# Patient Record
Sex: Female | Born: 1990 | Race: Black or African American | Hispanic: No | Marital: Single | Smoking: Never smoker
Health system: Southern US, Community
[De-identification: ages and names within clinical notes are randomized; demographics above are authoritative.]

## PROBLEM LIST (undated history)

## (undated) ENCOUNTER — Inpatient Hospital Stay (HOSPITAL_COMMUNITY): Payer: Self-pay

## (undated) DIAGNOSIS — A6000 Herpesviral infection of urogenital system, unspecified: Secondary | ICD-10-CM

## (undated) DIAGNOSIS — M329 Systemic lupus erythematosus, unspecified: Secondary | ICD-10-CM

## (undated) DIAGNOSIS — A549 Gonococcal infection, unspecified: Secondary | ICD-10-CM

## (undated) HISTORY — DX: Herpesviral infection of urogenital system, unspecified: A60.00

---
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
## 2011-01-08 LAB — CBC
HCT: 40.5 % (ref 36.0–49.0)
Hemoglobin: 11.5 g/dL — ABNORMAL LOW (ref 12.0–16.0)
Hemoglobin: 13.4 g/dL (ref 12.0–16.0)
MCHC: 33.2 g/dL (ref 31.0–37.0)
MCV: 89.1 fL (ref 78.0–98.0)
Platelets: 132 10*3/uL — ABNORMAL LOW (ref 150–400)
Platelets: 141 10*3/uL — ABNORMAL LOW (ref 150–400)
RDW: 14.3 % (ref 11.4–15.5)
WBC: 5.7 10*3/uL (ref 4.5–13.5)

## 2011-02-11 NOTE — H&P (Signed)
NAMEDYAN, Stacy Kelley                ACCOUNT NO.:  [id number]   MEDICAL RECORD NO.:  [id number]          PATIENT TYPE:  INP   LOCATION:  9109                          FACILITY:  [facility]   PHYSICIAN:  Roseanna Rainbow, M.D.DATE OF BIRTH:  25-Dec-1990   DATE OF ADMISSION:  01/01/2009  DATE OF DISCHARGE:                              HISTORY & PHYSICAL   CHIEF COMPLAINT:  The patient is a 20 year old, para 0 with an estimated  date of confinement of January 08, 2009 with an intrauterine pregnancy at  39 weeks, complicated by intrauterine growth restriction for induction  of labor.   HISTORY OF PRESENT ILLNESS:  The patient has had serial ultrasounds for  growth and the height and antenatal testing.  A most recent scan on  December 25, 2008 showed a normal amniotic fluid index, a BPP of 8/8,  estimated fetal weight percentile at the less than 10th percentile with  biometry suspect for asymmetric IUGR.  The S/D ratio for the Doppler  study was elevated.  This persisted with subsequent testing; however,  there was no evidence of any absence or reversal of flow.   ALLERGIES:  No known drug allergies.   MEDICATIONS:  Prenatal vitamins.   PRENATAL LABORATORY DATA:  Blood type is O positive.  Antibody screen  negative.  Chlamydia probe negative.  GC probe negative.  One-hour GTT  77.  GBS positive on December 04, 2008.  Hepatitis B surface antigen  negative.  Hematocrit 35.4 and hemoglobin 11.3.  HIV nonreactive.  Platelets 162,000.  RPR nonreactive.  Sickle cell negative.  Varicella  immune.   PAST GYN HISTORY:  Noncontributory.   PAST MEDICAL HISTORY:  Migraine headaches.   PAST SURGICAL HISTORY:  No previous surgery.   SOCIAL HISTORY:  She is a Consulting civil engineer.  She is single, does not give any  significant history of alcohol usage, has no significant smoking  history.  Denies illicit drug use.   FAMILY HISTORY:  Remarkable for diabetes and hypertension.   REVIEW OF SYSTEMS:  Noncontributory.   PHYSICAL EXAMINATION:  VITAL SIGNS:  Stable, Afebrile.  FHT: Reassuring  General: NAD  Abdomen: Gravid, NT  Pelvic: Per RN   ASSESSMENT & PLAN:  IUP at 39 weeks, IUGR elevated doppler S/D ratio.  FHT consistent with fetal well-being.   Admit.  Induction of labor    DICTATION ENDED AT THIS POINT.      Roseanna Rainbow, M.D.  Electronically Signed     LAJ/MEDQ  D:  01/01/2009  T:  01/01/2009  Job:  [id number]

## 2011-02-11 NOTE — Op Note (Signed)
[name], Stacy Kelley                ACCOUNT NO.:  [id number]   MEDICAL RECORD NO.:  [id number]          PATIENT TYPE:  INP   LOCATION:  9109                          FACILITY:  [facility]   PHYSICIAN:  Roseanna Rainbow, M.D.DATE OF BIRTH:  [birth year]   DATE OF PROCEDURE:  01/01/2009  DATE OF DISCHARGE:                               OPERATIVE REPORT   PREOPERATIVE DIAGNOSIS:  Intrauterine pregnancy at 39 weeks,  intrauterine growth restriction, breech presentation.   POSTOPERATIVE DIAGNOSIS:  Intrauterine pregnancy at 39 weeks,  intrauterine growth restriction, breech presentation.   PROCEDURE:  Primary low uterine flap, elliptical cesarean delivery via a  Pfannenstiel skin incision.   SURGEON:  Roseanna Rainbow, MD   ANESTHESIA:  Epidural.   FINDINGS:  The infant was in frank breech presentation.  The sacrum was  anterior.   PATHOLOGY:  Placenta.   ESTIMATED BLOOD LOSS:  600 mL.   COMPLICATIONS:  None.   PROCEDURE:  The patient was taken to the operating room with an IV  running and an epidural catheter in situ.  She was placed in the dorsal  supine position with a leftward tilt and prepped and draped in the usual  sterile fashion.  After time-out had been completed, a transverse skin  incision was then made with a scalpel.  This was carried down to the  underlying fascia with the Bovie.  The fascia was then nicked in the  midline.  The fascial incision was then extended along the length of the  incision.  The rectus muscles were separated in the midline bluntly.  The parietal peritoneum was entered bluntly and this incision was then  extended bluntly.  The bladder blade was then placed.  The vesicouterine  peritoneum was tented up and entered sharply.  This incision was then  extended bilaterally and the bladder flap created bluntly.  The bladder  blade was then placed.  The lower uterine segment was then incised in a  transverse fashion with the scalpel.  This  incision was then extended  bluntly.  A complete breech extraction was then performed.  The head was  then delivered atraumatically.  The oropharynx was suctioned with bulb  suction.  The cord was clamped and cut.  The infant was handed off to  the awaiting neonatologist.  The placenta was then removed.  The  intrauterine cavity was evacuated of any remaining amniotic fluid,  clots, and debris with a moistened laparotomy sponge.  The uterine  incision was then reapproximated in a running interlocking fashion using  a suture of 0 Monocryl.  A second imbricating layer of the same suture  was then placed.  Figure-of-eight sutures of 2-0 Vicryl were placed to  control individual bleeding points on the uterine serosa.  The paracolic  gutters were then irrigated.  The parietal peritoneum was then  reapproximated in a running fashion using a suture of 2-0 Vicryl.  The  fascia was reapproximated in a running fashion using 2 sutures of 0  Vicryl tied in the midline.  The skin was closed with staples.  At the  close of  the procedure, the instrument and pack counts were said to be  correct x2.  The patient was taken to the PACU awake and stable  condition.      Roseanna Rainbow, M.D.  Electronically Signed     LAJ/MEDQ  D:  01/01/2009  T:  01/02/2009  Job:  [id number]

## 2011-02-14 NOTE — Discharge Summary (Signed)
Stacy Kelley, MIS                ACCOUNT NO.:  [id number]   MEDICAL RECORD NO.:  [id number]          PATIENT TYPE:  INP   LOCATION:  9109                          [facility]ACILITY:  [facility]   PHYSICIAN:  Roseanna Rainbow, M.D.DATE OF BIRTH:  12/05/1990   DATE OF ADMISSION:  01/01/2009  DATE OF DISCHARGE:  01/04/2009                               DISCHARGE SUMMARY   CHIEF COMPLAINT:  The patient is a 20 year old para 0 with an estimated  date of confinement of January 08, 2009 with an intrauterine pregnancy at  39 weeks complicated by intrauterine growth restriction for induction of  labor.  Please see the dictated history and physical.   HOSPITAL COURSE:  The patient was admitted.  Cervix was ripened.  Low-  dose Pitocin was then started.  She progressed in labor.  However, was  found to have a breech presentation.  At that point, she was brought for  cesarean delivery.  Please see the dictated operative summary.  On  postoperative day #1 the hemoglobin was 11.  The remainder of the  postoperative course was uneventful.  She was discharged to home on  postoperative day 3 tolerating a regular diet.  She received Depo-  Provera for contraception prior to discharge.   DISCHARGE DIAGNOSIS:  1. Intrauterine pregnancy at 39 weeks.  2. Intrauterine growth restriction.  3. Breech presentation.   PROCEDURES:  Cesarean delivery.   CONDITION:  Stable.   DIET:  Regular.   ACTIVITY:  Pelvic rest progressive activity.   MEDICATIONS:  Included Percocet.   DISPOSITION:  The patient was to follow up in the office in 2 weeks.      Roseanna Rainbow, M.D.  Electronically Signed     LAJ/MEDQ  D:  01/18/2009  T:  01/18/2009  Job:  [id number]

## 2011-04-10 ENCOUNTER — Emergency Department (HOSPITAL_COMMUNITY)
Admission: EM | Admit: 2011-04-10 | Discharge: 2011-04-10 | Disposition: A | Discharge status: Home / Self Care | Payer: Medicaid Other | Attending: Emergency Medicine | Admitting: Emergency Medicine

## 2011-04-10 ENCOUNTER — Emergency Department (HOSPITAL_COMMUNITY): Payer: Medicaid Other

## 2011-04-10 DIAGNOSIS — R109 Unspecified abdominal pain: Secondary | ICD-10-CM | POA: Insufficient documentation

## 2011-04-10 DIAGNOSIS — Z3201 Encounter for pregnancy test, result positive: Secondary | ICD-10-CM | POA: Insufficient documentation

## 2011-04-10 LAB — POCT PREGNANCY, URINE: Preg Test, Ur: POSITIVE

## 2011-04-10 LAB — POCT I-STAT, CHEM 8
BUN: 5 mg/dL — ABNORMAL LOW (ref 6–23)
Calcium, Ion: 1.12 mmol/L (ref 1.12–1.32)
HCT: 39 % (ref 36.0–46.0)
Sodium: 137 mEq/L (ref 135–145)

## 2011-04-10 LAB — HCG, QUANTITATIVE, PREGNANCY: hCG, Beta Chain, Quant, S: 62691 m[IU]/mL — ABNORMAL HIGH (ref <5)

## 2011-04-10 LAB — URINALYSIS, ROUTINE W REFLEX MICROSCOPIC
Glucose, UA: NEGATIVE mg/dL
Leukocytes, UA: NEGATIVE
Specific Gravity, Urine: 1.027 (ref 1.005–1.030)
Urobilinogen, UA: 1 mg/dL (ref 0.0–1.0)

## 2011-04-10 LAB — CBC
MCHC: 34.2 g/dL (ref 30.0–36.0)
Platelets: 168 10*3/uL (ref 150–400)
RDW: 14.1 % (ref 11.5–15.5)
WBC: 2.8 10*3/uL — ABNORMAL LOW (ref 4.0–10.5)

## 2011-04-10 LAB — WET PREP, GENITAL
Trich, Wet Prep: NONE SEEN
Yeast Wet Prep HPF POC: NONE SEEN

## 2011-04-13 LAB — GC/CHLAMYDIA PROBE AMP, GENITAL: GC Probe Amp, Genital: NEGATIVE

## 2011-10-25 ENCOUNTER — Other Ambulatory Visit: Payer: Self-pay

## 2011-10-25 ENCOUNTER — Encounter (HOSPITAL_COMMUNITY): Payer: Self-pay | Admitting: Emergency Medicine

## 2011-10-25 ENCOUNTER — Emergency Department (HOSPITAL_COMMUNITY): Payer: Self-pay

## 2011-10-25 ENCOUNTER — Emergency Department (HOSPITAL_COMMUNITY)
Admission: EM | Admit: 2011-10-25 | Discharge: 2011-10-26 | Disposition: A | Discharge status: Home / Self Care | Payer: Self-pay | Attending: Emergency Medicine | Admitting: Emergency Medicine

## 2011-10-25 DIAGNOSIS — R079 Chest pain, unspecified: Secondary | ICD-10-CM | POA: Insufficient documentation

## 2011-10-25 DIAGNOSIS — R000 Tachycardia, unspecified: Secondary | ICD-10-CM | POA: Insufficient documentation

## 2011-10-25 DIAGNOSIS — R509 Fever, unspecified: Secondary | ICD-10-CM | POA: Insufficient documentation

## 2011-10-25 DIAGNOSIS — R51 Headache: Secondary | ICD-10-CM | POA: Insufficient documentation

## 2011-10-25 DIAGNOSIS — R0602 Shortness of breath: Secondary | ICD-10-CM | POA: Insufficient documentation

## 2011-10-25 DIAGNOSIS — B9789 Other viral agents as the cause of diseases classified elsewhere: Secondary | ICD-10-CM | POA: Insufficient documentation

## 2011-10-25 DIAGNOSIS — B349 Viral infection, unspecified: Secondary | ICD-10-CM

## 2011-10-25 DIAGNOSIS — R5381 Other malaise: Secondary | ICD-10-CM | POA: Insufficient documentation

## 2011-10-25 DIAGNOSIS — R5383 Other fatigue: Secondary | ICD-10-CM | POA: Insufficient documentation

## 2011-10-25 LAB — URINALYSIS, ROUTINE W REFLEX MICROSCOPIC
Nitrite: NEGATIVE
Specific Gravity, Urine: 1.02 (ref 1.005–1.030)
Urobilinogen, UA: 1 mg/dL (ref 0.0–1.0)
pH: 6 (ref 5.0–8.0)

## 2011-10-25 LAB — BLOOD GAS, VENOUS
Bicarbonate: 24.3 mEq/L — ABNORMAL HIGH (ref 20.0–24.0)
TCO2: 21.5 mmol/L (ref 0–100)
pCO2, Ven: 38.8 mmHg — ABNORMAL LOW (ref 45.0–50.0)
pH, Ven: 7.413 — ABNORMAL HIGH (ref 7.250–7.300)
pO2, Ven: 59.5 mmHg — ABNORMAL HIGH (ref 30.0–45.0)

## 2011-10-25 LAB — DIFFERENTIAL
Eosinophils Absolute: 0 10*3/uL (ref 0.0–0.7)
Lymphocytes Relative: 17 % (ref 12–46)
Lymphs Abs: 0.7 10*3/uL (ref 0.7–4.0)
Monocytes Relative: 10 % (ref 3–12)
Neutro Abs: 3.1 10*3/uL (ref 1.7–7.7)
Neutrophils Relative %: 72 % (ref 43–77)

## 2011-10-25 LAB — LACTIC ACID, PLASMA: Lactic Acid, Venous: 1.2 mmol/L (ref 0.5–2.2)

## 2011-10-25 LAB — POCT I-STAT, CHEM 8
Calcium, Ion: 1.16 mmol/L (ref 1.12–1.32)
Creatinine, Ser: 0.7 mg/dL (ref 0.50–1.10)
Glucose, Bld: 116 mg/dL — ABNORMAL HIGH (ref 70–99)
Potassium: 3 mEq/L — ABNORMAL LOW (ref 3.5–5.1)
Sodium: 140 mEq/L (ref 135–145)

## 2011-10-25 LAB — URINE MICROSCOPIC-ADD ON

## 2011-10-25 LAB — CBC
Hemoglobin: 13.1 g/dL (ref 12.0–15.0)
MCH: 27.6 pg (ref 26.0–34.0)
RBC: 4.74 MIL/uL (ref 3.87–5.11)

## 2011-10-25 LAB — POCT PREGNANCY, URINE: Preg Test, Ur: NEGATIVE

## 2011-10-25 MED ORDER — POTASSIUM CHLORIDE CRYS ER 20 MEQ PO TBCR
40.0000 meq | EXTENDED_RELEASE_TABLET | Freq: Once | ORAL | Status: AC
  Administered 2011-10-25: 40 meq via ORAL
  Filled 2011-10-25: qty 2

## 2011-10-25 MED ORDER — SODIUM CHLORIDE 0.9 % IV BOLUS (SEPSIS)
1000.0000 mL | Freq: Once | INTRAVENOUS | Status: AC
  Administered 2011-10-25: 1000 mL via INTRAVENOUS

## 2011-10-25 MED ORDER — ASPIRIN 81 MG PO CHEW
324.0000 mg | CHEWABLE_TABLET | Freq: Once | ORAL | Status: AC
  Administered 2011-10-25: 324 mg via ORAL
  Filled 2011-10-25: qty 4

## 2011-10-25 MED ORDER — ACETAMINOPHEN 325 MG PO TABS
650.0000 mg | ORAL_TABLET | Freq: Four times a day (QID) | ORAL | Status: DC | PRN
  Administered 2011-10-25: 650 mg via ORAL
  Filled 2011-10-25: qty 2

## 2011-10-25 MED ORDER — IOHEXOL 300 MG/ML  SOLN
100.0000 mL | Freq: Once | INTRAMUSCULAR | Status: AC | PRN
  Administered 2011-10-25: 100 mL via INTRAVENOUS

## 2011-10-25 NOTE — ED Notes (Signed)
ZOX:[id number]<AV> Expected date:10/25/11<BR> Expected time: 8:51 PM<BR> Means of arrival:Ambulance<BR> Comments:<BR> EMS 241 GC  - sob - s/s began at 4pm/pt ambulatory on the scene

## 2011-10-25 NOTE — ED Notes (Signed)
Patient transported to CT 

## 2011-10-25 NOTE — ED Notes (Signed)
AS per EMS pt c/o sob starting at 4pm and no feeling well.

## 2011-10-26 ENCOUNTER — Other Ambulatory Visit: Payer: Self-pay

## 2011-10-26 LAB — POCT I-STAT TROPONIN I: Troponin i, poc: 0 ng/mL (ref 0.00–0.08)

## 2011-10-26 MED ORDER — OSELTAMIVIR PHOSPHATE 75 MG PO CAPS: 75.0000 mg | ORAL_CAPSULE | Freq: Two times a day (BID) | ORAL | Status: AC

## 2011-10-26 MED ORDER — SODIUM CHLORIDE 0.9 % IV BOLUS (SEPSIS)
1000.0000 mL | Freq: Once | INTRAVENOUS | Status: AC
  Administered 2011-10-26: 1000 mL via INTRAVENOUS

## 2011-10-26 MED ORDER — OSELTAMIVIR PHOSPHATE 75 MG PO CAPS
75.0000 mg | ORAL_CAPSULE | Freq: Once | ORAL | Status: AC
  Administered 2011-10-26: 75 mg via ORAL
  Filled 2011-10-26: qty 1

## 2011-10-26 MED ORDER — MORPHINE SULFATE 4 MG/ML IJ SOLN
4.0000 mg | Freq: Once | INTRAMUSCULAR | Status: AC
  Administered 2011-10-26: 4 mg via INTRAVENOUS
  Filled 2011-10-26: qty 1

## 2011-10-26 NOTE — ED Provider Notes (Addendum)
History     CSN: [id number]  Arrival date & time 10/25/11  2053   First MD Initiated Contact with Patient 10/25/11 2058      Chief Complaint  Patient presents with  . Breathing Problem  . Fever    (Consider location/radiation/quality/duration/timing/severity/associated sxs/prior treatment) HPI Patient is a 21 year old female in no history of medical problems who presents today complaining of 3 days of upper respiratory symptoms. She also complains of slight chest pain. This is worse with cough and deep breath. She has no history of DVT or PE. Patient does not smoke and is not taking any hormone therapies. She's not been on any recent long trips. She has no history of heart disease. Patient has been around her son who is had URI symptoms. She endorses having some mild congestion and fever. Patient is tachycardic to the 130s upon arrival. She's also febrile. She last took Tylenol at 4 PM. She denies any urinary symptoms or abdominal symptoms. There are no other associated or modifying factors No past medical history on file.  No past surgical history on file.  No family history on file.  History  Substance Use Topics  . Smoking status: Never Smoker   . Smokeless tobacco: Not on file  . Alcohol Use: No    OB History    Grav Para Term Preterm Abortions TAB SAB Ect Mult Living                  Review of Systems  Constitutional: Positive for fever, appetite change and fatigue.  HENT: Positive for congestion and sore throat.   Eyes: Negative.   Respiratory: Positive for cough.   Cardiovascular: Positive for chest pain.  Gastrointestinal: Negative.   Genitourinary: Negative.   Musculoskeletal: Negative.   Skin: Negative.   Neurological: Positive for headaches.  Hematological: Negative.   Psychiatric/Behavioral: Negative.   All other systems reviewed and are negative.    Allergies  Review of patient's allergies indicates no known allergies.  Home Medications    Current Outpatient Rx  Name Route Sig Dispense Refill  . ACETAMINOPHEN 325 MG PO TABS Oral Take 650 mg by mouth every 6 (six) hours as needed. For pain.      BP 110/60  Pulse 107  Temp(Src) 98.5 F (36.9 C) (Oral)  Resp 20  SpO2 100%  LMP 10/16/2011  Physical Exam  Nursing note and vitals reviewed. Constitutional: She is oriented to person, place, and time. She appears well-developed and well-nourished. No distress.  HENT:  Head: Normocephalic and atraumatic.  Eyes: Conjunctivae and EOM are normal. Pupils are equal, round, and reactive to light.  Neck: Normal range of motion.  Cardiovascular: Regular rhythm, normal heart sounds and intact distal pulses.  Tachycardia present.  Exam reveals no gallop and no friction rub.   No murmur heard. Pulmonary/Chest: Effort normal and breath sounds normal. No respiratory distress. She has no wheezes. She has no rales.  Abdominal: Soft. Bowel sounds are normal. She exhibits no distension. There is no tenderness. There is no rebound and no guarding.  Musculoskeletal: Normal range of motion. She exhibits no edema and no tenderness.  Neurological: She is alert and oriented to person, place, and time. No cranial nerve deficit. She exhibits normal muscle tone. Coordination normal.  Skin: Skin is warm and dry. No rash noted.  Psychiatric:       Anxious    ED Course  Procedures (including critical care time)   Date: 10/25/2011  Rate: 134  Rhythm: sinus  tachycardia  QRS Axis: normal  Intervals: normal  ST/T Wave abnormalities: nonspecific ST/T changes  Conduction Disutrbances:none  Narrative Interpretation:   Old EKG Reviewed: none available   Date: 10/26/2011  Rate: 102  Rhythm: sinus tachycardia  QRS Axis: right  Intervals: normal  ST/T Wave abnormalities: nonspecific T wave changes  Conduction Disutrbances:none  Narrative Interpretation: Improved rate. T-wave inversions all that are seen now  Old EKG Reviewed: changes  noted  Labs Reviewed  BLOOD GAS, VENOUS - Abnormal; Notable for the following:    pH, Ven 7.413 (*)    pCO2, Ven 38.8 (*)    pO2, Ven 59.5 (*)    Bicarbonate 24.3 (*)    All other components within normal limits  URINALYSIS, ROUTINE W REFLEX MICROSCOPIC - Abnormal; Notable for the following:    Hgb urine dipstick SMALL (*)    Ketones, ur TRACE (*)    Protein, ur 30 (*)    Leukocytes, UA SMALL (*)    All other components within normal limits  D-DIMER, QUANTITATIVE - Abnormal; Notable for the following:    D-Dimer, Quant 1.19 (*)    All other components within normal limits  POCT I-STAT, CHEM 8 - Abnormal; Notable for the following:    Potassium 3.0 (*)    BUN 5 (*)    Glucose, Bld 116 (*)    All other components within normal limits  URINE MICROSCOPIC-ADD ON - Abnormal; Notable for the following:    Bacteria, UA FEW (*)    All other components within normal limits  CBC  DIFFERENTIAL  LACTIC ACID, PLASMA  POCT PREGNANCY, URINE  POCT I-STAT TROPONIN I  POCT PREGNANCY, URINE  I-STAT TROPONIN I  POCT URINE PREGNANCY  I-STAT, CHEM 8  I-STAT TROPONIN I   Dg Chest 2 View  10/25/2011  *RADIOLOGY REPORT*  Clinical Data: Shortness of breath.  Chest pain.  CHEST - 2 VIEW  Comparison:  None.  Findings:  The heart size and mediastinal contours are within normal limits.  Both lungs are clear.  The visualized skeletal structures are unremarkable.  IMPRESSION: No active cardiopulmonary disease.  Original Report Authenticated By: Danae Orleans, M.D.   Ct Angio Chest W/cm &/or Wo Cm  10/25/2011  *RADIOLOGY REPORT*  Clinical Data: Chest pain.  Shortness of breath.  Elevated D-dimer.  CT ANGIOGRAPHY CHEST  Technique:  Multidetector CT imaging of the chest using the standard protocol during bolus administration of intravenous contrast. Multiplanar reconstructed images including MIPs were obtained and reviewed to evaluate the vascular anatomy.  Contrast: OMNIPAQUE IOHEXOL 300 MG/ML IV SOLN   Comparison: None.  Findings: Satisfactory opacification of the pulmonary arteries noted, and there is no evidence of pulmonary emboli.  No evidence of thoracic aortic aneurysm or dissection.  No evidence of mediastinal hematoma or mass. No adenopathy seen elsewhere within the thorax.  Residual thymic tissue noted in the anterior mediastinum which is normal for age.  There is no evidence of pleural or pericardial effusion.  Both lungs are clear.  No evidence of pulmonary infiltrate or mass.  No evidence of central endobronchial lesion.  IMPRESSION: Negative.  No evidence of pulmonary embolism or other active disease.  Original Report Authenticated By: Danae Orleans, M.D.     1. Chest pain   2. Viral syndrome       MDM  Patient was evaluated by myself. Symptoms were thought to be mostly secondary to viral syndrome. Patient was febrile and was given Tylenol. IV fluids were also  ordered. CBC showed no leukocytosis. Renal panel was unremarkable. Patient's vital signs did improve. Given her variation in symptoms with inspiration d-dimer was ordered. Additionally patient did have nonspecific ST segment T wave changes on her EKG. There is no EKG available for comparison. A troponin was ordered and this was negative. Once patient's symptoms improved repeat EKG was performed. T-wave inversions that are still noted. Three-hour troponin was ordered. Patient was given aspirin. Her chest pain resolved. Given elevated d-dimer patient had CT. There is no acute pulmonary process noted. Patient was given Tamiflu given the severity of her symptoms. At this time she is pending her three-hour troponin. If this returns within normal limits patient will be discharged home with prescription for Tamiflu. She is receiving a second liter of IV fluid at this time.        Cyndra Numbers, MD 10/26/11 0054  2:07 AM Second troponin returned normal. Heart rate is down to the 80s. Patient will be discharged in good condition with  prescription for Tamiflu. Family and patient comfortable with plan. Work note was provided.  Cyndra Numbers, MD 10/26/11 [phone]

## 2011-12-11 ENCOUNTER — Encounter (HOSPITAL_COMMUNITY): Payer: Self-pay | Admitting: Emergency Medicine

## 2011-12-11 ENCOUNTER — Emergency Department (HOSPITAL_COMMUNITY)
Admission: EM | Admit: 2011-12-11 | Discharge: 2011-12-11 | Disposition: A | Discharge status: Home / Self Care | Payer: Self-pay | Attending: Emergency Medicine | Admitting: Emergency Medicine

## 2011-12-11 DIAGNOSIS — T22019A Burn of unspecified degree of unspecified forearm, initial encounter: Secondary | ICD-10-CM | POA: Insufficient documentation

## 2011-12-11 DIAGNOSIS — X19XXXA Contact with other heat and hot substances, initial encounter: Secondary | ICD-10-CM | POA: Insufficient documentation

## 2011-12-11 DIAGNOSIS — Y9269 Other specified industrial and construction area as the place of occurrence of the external cause: Secondary | ICD-10-CM | POA: Insufficient documentation

## 2011-12-11 DIAGNOSIS — T300 Burn of unspecified body region, unspecified degree: Secondary | ICD-10-CM

## 2011-12-11 MED ORDER — BACITRACIN ZINC 500 UNIT/GM EX OINT: TOPICAL_OINTMENT | Freq: Two times a day (BID) | CUTANEOUS | Status: AC

## 2011-12-11 NOTE — Discharge Instructions (Signed)
Please keep the area clean and dry per the instructions. Return to your primary care doctor's office for worsening condition.
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
## 2011-12-13 NOTE — ED Provider Notes (Signed)
Medical screening examination/treatment/procedure(s) were performed by non-physician practitioner and as supervising physician I was immediately available for consultation/collaboration.  Lauretta Sallas R Zafar Debrosse, MD 12/13/11 0704 

## 2012-11-05 ENCOUNTER — Emergency Department (HOSPITAL_COMMUNITY)
Admission: EM | Admit: 2012-11-05 | Discharge: 2012-11-05 | Disposition: A | Discharge status: Home / Self Care | Payer: Self-pay | Attending: Emergency Medicine | Admitting: Emergency Medicine

## 2012-11-05 ENCOUNTER — Encounter (HOSPITAL_COMMUNITY): Payer: Self-pay | Admitting: Emergency Medicine

## 2012-11-05 DIAGNOSIS — H6690 Otitis media, unspecified, unspecified ear: Secondary | ICD-10-CM | POA: Insufficient documentation

## 2012-11-05 MED ORDER — AMOXICILLIN 500 MG PO CAPS: 1000.0000 mg | ORAL_CAPSULE | Freq: Three times a day (TID) | ORAL | Status: DC

## 2012-11-05 NOTE — ED Provider Notes (Signed)
History     CSN: [id number]  Arrival date & time 11/05/12  1153   First MD Initiated Contact with Patient 11/05/12 1215      Chief Complaint  Patient presents with  . Otalgia    (Consider location/radiation/quality/duration/timing/severity/associated sxs/prior treatment) HPI   Stacy Kelley is a 22 y.o. female in no acute distress complaining of right ear pain worsening over the last 24 hours. Patient denies fever, cough, pharyngitis, distant, shortness of breath abdominal pain, nausea vomiting diarrhea. She rates her pain as severe 7/10. No exacerbating or alleviating factors identified.  History reviewed. No pertinent past medical history.  History reviewed. No pertinent past surgical history.  No family history on file.  History  Substance Use Topics  . Smoking status: Never Smoker   . Smokeless tobacco: Not on file  . Alcohol Use: No    OB History    Grav Para Term Preterm Abortions TAB SAB Ect Mult Living                  Review of Systems  Constitutional: Negative for fever.  Respiratory: Negative for shortness of breath.   Cardiovascular: Negative for chest pain.  Gastrointestinal: Negative for nausea, vomiting, abdominal pain and diarrhea.  All other systems reviewed and are negative.    Allergies  Review of patient's allergies indicates no known allergies.  Home Medications  No current outpatient prescriptions on file.  BP 116/69  Pulse 83  Temp 98.7 F (37.1 C) (Oral)  Resp 18  SpO2 100%  Physical Exam  Nursing note and vitals reviewed. Constitutional: She is oriented to person, place, and time. She appears well-developed and well-nourished. No distress.  HENT:  Head: Normocephalic.  Right Ear: External ear normal.  Left Ear: External ear normal.  Mouth/Throat: Oropharynx is clear and moist.       Right TM significantly injected and mildly bulging with reduced light reflex  Eyes: Conjunctivae normal and EOM are normal. Pupils are equal,  round, and reactive to light.  Neck: Neck supple.       Shotty nontender anterior cervical lymphadenopathy  Cardiovascular: Normal rate.   Pulmonary/Chest: Effort normal and breath sounds normal. No stridor. No respiratory distress. She has no wheezes. She has no rales. She exhibits no tenderness.  Abdominal: Soft. Bowel sounds are normal. She exhibits no distension and no mass. There is no tenderness. There is no rebound and no guarding.  Musculoskeletal: Normal range of motion.  Lymphadenopathy:    She has cervical adenopathy.  Neurological: She is alert and oriented to person, place, and time.  Psychiatric: She has a normal mood and affect.    ED Course  Procedures (including critical care time)  Labs Reviewed - No data to display No results found.   1. Otitis media       MDM     Filed Vitals:   11/05/12 1202  BP: 116/69  Pulse: 83  Temp: 98.7 F (37.1 C)  TempSrc: Oral  Resp: 18  SpO2: 100%     Pt verbalized understanding and agrees with care plan. Outpatient follow-up and return precautions given.    New Prescriptions   AMOXICILLIN (AMOXIL) 500 MG CAPSULE    Take 2 capsules (1,000 mg total) by mouth 3 (three) times daily.    I personally performed the services described in this documentation, which was scribed in my presence. The recorded information has been reviewed and is accurate.        Wynetta Emery, PA-C 11/05/12  1314 

## 2012-11-05 NOTE — ED Notes (Signed)
Pt complains of right ear pain x 1 day

## 2012-11-06 NOTE — ED Provider Notes (Signed)
Medical screening examination/treatment/procedure(s) were performed by non-physician practitioner and as supervising physician I was immediately available for consultation/collaboration.  Tyeler Goedken, MD 11/06/12 1825 

## 2013-01-25 ENCOUNTER — Emergency Department (HOSPITAL_COMMUNITY)
Admission: EM | Admit: 2013-01-25 | Discharge: 2013-01-25 | Disposition: A | Discharge status: Home / Self Care | Payer: Self-pay | Attending: Emergency Medicine | Admitting: Emergency Medicine

## 2013-01-25 ENCOUNTER — Emergency Department (HOSPITAL_COMMUNITY): Payer: Self-pay

## 2013-01-25 DIAGNOSIS — R1310 Dysphagia, unspecified: Secondary | ICD-10-CM | POA: Insufficient documentation

## 2013-01-25 DIAGNOSIS — R630 Anorexia: Secondary | ICD-10-CM | POA: Insufficient documentation

## 2013-01-25 DIAGNOSIS — R599 Enlarged lymph nodes, unspecified: Secondary | ICD-10-CM | POA: Insufficient documentation

## 2013-01-25 DIAGNOSIS — R609 Edema, unspecified: Secondary | ICD-10-CM | POA: Insufficient documentation

## 2013-01-25 DIAGNOSIS — R259 Unspecified abnormal involuntary movements: Secondary | ICD-10-CM | POA: Insufficient documentation

## 2013-01-25 DIAGNOSIS — K1370 Unspecified lesions of oral mucosa: Secondary | ICD-10-CM | POA: Insufficient documentation

## 2013-01-25 DIAGNOSIS — E042 Nontoxic multinodular goiter: Secondary | ICD-10-CM | POA: Insufficient documentation

## 2013-01-25 DIAGNOSIS — K0889 Other specified disorders of teeth and supporting structures: Secondary | ICD-10-CM

## 2013-01-25 DIAGNOSIS — J029 Acute pharyngitis, unspecified: Secondary | ICD-10-CM | POA: Insufficient documentation

## 2013-01-25 LAB — POCT I-STAT, CHEM 8
Chloride: 105 mEq/L (ref 96–112)
HCT: 39 % (ref 36.0–46.0)
Hemoglobin: 13.3 g/dL (ref 12.0–15.0)
Potassium: 3.4 mEq/L — ABNORMAL LOW (ref 3.5–5.1)
Sodium: 142 mEq/L (ref 135–145)

## 2013-01-25 MED ORDER — HYDROCODONE-ACETAMINOPHEN 5-325 MG PO TABS: 1.0000 | ORAL_TABLET | ORAL | Status: DC | PRN

## 2013-01-25 MED ORDER — OXYCODONE-ACETAMINOPHEN 5-325 MG PO TABS
2.0000 | ORAL_TABLET | Freq: Once | ORAL | Status: AC
  Administered 2013-01-25: 1 via ORAL
  Filled 2013-01-25: qty 1

## 2013-01-25 MED ORDER — IOHEXOL 300 MG/ML  SOLN
80.0000 mL | Freq: Once | INTRAMUSCULAR | Status: AC | PRN
  Administered 2013-01-25: 80 mL via INTRAVENOUS

## 2013-01-25 MED ORDER — PENICILLIN V POTASSIUM 500 MG PO TABS: 500.0000 mg | ORAL_TABLET | Freq: Three times a day (TID) | ORAL | Status: AC

## 2013-01-25 MED ORDER — ONDANSETRON 4 MG PO TBDP
4.0000 mg | ORAL_TABLET | Freq: Once | ORAL | Status: AC
  Administered 2013-01-25: 4 mg via ORAL
  Filled 2013-01-25: qty 1

## 2013-01-25 NOTE — ED Provider Notes (Signed)
History    This chart was scribed for Arthor Captain (PA) non-physician practitioner working with Juliet Rude. Rubin Payor, MD by Sofie Rower, ED Scribe. This patient was seen in room WTR5/WTR5 and the patient's care was started at 4:11PM.   CSN: [id number]  Arrival date & time 01/25/13  1435   First MD Initiated Contact with Patient 01/25/13 1611      Chief Complaint  Patient presents with  . Dental Pain    (Consider location/radiation/quality/duration/timing/severity/associated sxs/prior treatment) The history is provided by the patient. No language interpreter was used.   Stacy Kelley is a 22 y.o. female , with no known medical hx, who presents to the Emergency Department complaining of non radiating dental pain located at the right lower jaw, onset two days ago (01/23/13).  Associated symptoms include sore throat, difficulty swallowing, and loss of appetite. The pt reports she has been experiencing a painful sensation within her right lower jaw since Sunday evening, 01/23/13, which has become progressively worse, prompting her concern and desire to seek medical evaluation at [facility] this evening (01/25/13). The pt has washed her mouth with peroxide, which she informs, does not provide relief of her associated sore throat.   The pt denies fever, vomiting, and chills.   The pt does not smoke or drink alcohol.   PCP is Dr. Tamela Oddi.    No past medical history on file.  No past surgical history on file.  No family history on file.  History  Substance Use Topics  . Smoking status: Never Smoker   . Smokeless tobacco: Not on file  . Alcohol Use: No    OB History   Grav Para Term Preterm Abortions TAB SAB Ect Mult Living                  Review of Systems  Constitutional: Negative for fever and chills.  HENT: Positive for sore throat, trouble swallowing and dental problem.   Gastrointestinal: Negative for vomiting.  All other systems reviewed and are negative.    Allergies   Review of patient's allergies indicates no known allergies.  Home Medications   Current Outpatient Rx  Name  Route  Sig  Dispense  Refill  . amoxicillin (AMOXIL) 500 MG capsule   Oral   Take 2 capsules (1,000 mg total) by mouth 3 (three) times daily.   60 capsule   0     BP 113/70  Pulse 81  Temp(Src) 97.6 F (36.4 C)  SpO2 100%  Physical Exam  Nursing note and vitals reviewed. Constitutional: She is oriented to person, place, and time. She appears well-developed and well-nourished. No distress.  HENT:  Head: Normocephalic and atraumatic. There is trismus in the jaw.  Right side of lower jaw, tissue is white. Trismus detected. Uvula edematous. Anterior palatine arches edematous. Pain with swallowing.   Eyes: EOM are normal.  Neck: Neck supple. No tracheal deviation present.  Cardiovascular: Normal rate.   Pulmonary/Chest: Effort normal. No respiratory distress.  Musculoskeletal: Normal range of motion.  Neurological: She is alert and oriented to person, place, and time.  Skin: Skin is warm and dry.  Psychiatric: She has a normal mood and affect. Her behavior is normal.    ED Course  Procedures (including critical care time)  DIAGNOSTIC STUDIES: Oxygen Saturation is 100% on room air, normal by my interpretation.    COORDINATION OF CARE:  4:53 PM- Treatment plan discussed with patient. Pt agrees with treatment.  6:26 PM- Recheck. Treatment plan concerning laboratory  and radiologic results discussed with patient. Pt agrees with treatment.  6:35PM- Phone consultation with Dr. Suszanne Conners (ENT). Pt condition as well as laboratory and radiology results discussed. Dr. Suszanne Conners advises pt to follow up with radiology.   6:50PM- Conslt with [facility] [place] who will perform FNA.    Results for orders placed during the hospital encounter of 01/25/13  POCT I-STAT, CHEM 8      Result Value Range   Sodium 142  135 - 145 mEq/L   Potassium 3.4 (*) 3.5 - 5.1 mEq/L   Chloride  105  96 - 112 mEq/L   BUN 7  6 - 23 mg/dL   Creatinine, Ser 4.09  0.50 - 1.10 mg/dL   Glucose, Bld 94  70 - 99 mg/dL   Calcium, Ion 8.11  9.14 - 1.23 mmol/L   TCO2 26  0 - 100 mmol/L   Hemoglobin 13.3  12.0 - 15.0 g/dL   HCT 78.2  95.6 - 21.3 %   Ct Soft Tissue Neck W Contrast  01/25/2013  *RADIOLOGY REPORT*  Clinical Data: Right lower mandibular dental pain.  Pharyngitis. Dysphagia.  CT NECK WITH CONTRAST  Technique:  Multidetector CT imaging of the neck was performed with intravenous contrast.  Contrast: 80mL OMNIPAQUE IOHEXOL 300 MG/ML  SOLN  Comparison: None.  Findings: Mildly enlarged thyroid gland containing a small number of small nodules on the left.  The largest nodule is located in the superior portion of the left lobe and measures 5 mm in maximum diameter on image number 72.  The prevertebral and parapharyngeal soft tissues have normal appearances.  No visible dental caries or periapical lucency.  No soft tissue fluid collections or soft tissue stranding.  Mildly prominent jugulodigastric lymph nodes bilaterally.  The largest is on the right, with a short axis diameter of 9 mm on image number 71.  Unremarkable bones.  Clear lung apices.  IMPRESSION:  1.  No acute abnormality. 2.  Mild multinodular goiter. 3.  Mild reactive adenopathy.   Original Report Authenticated By: Beckie Salts, M.D.       No diagnosis found.    MDM  5:04 PM BP 113/70  Pulse 81  Temp(Src) 97.6 F (36.4 C)  SpO2 100% Although patient does have stable VS, she has trismus, pain with swallowing, edematous uvula and tonsils.  Concern for possible ludwig's angina.  Will obtain ct soft tissue of neck.   6:48 PM Ct shows reactive lymphadenopathy.  No concern for ludwig's. Incidental findign of multinodular goiter.  Patient will folow up with Dr. Gerrit Friends at [facility] for FNA.Patient with toothache.  No gross abscess.  Exam unconcerning for Ludwig's angina or spread of infection.  Will treat with penicillin and pain  medicine.  Urged patient to follow-up with dentist.      I personally performed the services described in this documentation, which was scribed in my presence. The recorded information has been reviewed and is accurate.     Arthor Captain, PA-C 01/26/13 [phone]

## 2013-01-25 NOTE — ED Notes (Signed)
Rt side mouth pain since sun states that she has been rising her mouth out with peroxide and it has helped some.

## 2013-01-27 NOTE — ED Provider Notes (Signed)
Medical screening examination/treatment/procedure(s) were performed by non-physician practitioner and as supervising physician I was immediately available for consultation/collaboration.  Stacey Maura R. Zyanna Leisinger, MD 01/27/13 0705 

## 2013-02-28 ENCOUNTER — Ambulatory Visit (INDEPENDENT_AMBULATORY_CARE_PROVIDER_SITE_OTHER): Payer: Self-pay | Admitting: Surgery

## 2013-03-02 ENCOUNTER — Encounter (INDEPENDENT_AMBULATORY_CARE_PROVIDER_SITE_OTHER): Payer: Self-pay | Admitting: Surgery

## 2013-03-07 ENCOUNTER — Emergency Department (HOSPITAL_COMMUNITY)
Admission: EM | Admit: 2013-03-07 | Discharge: 2013-03-07 | Disposition: A | Discharge status: Home / Self Care | Payer: Self-pay | Attending: Emergency Medicine | Admitting: Emergency Medicine

## 2013-03-07 ENCOUNTER — Encounter (HOSPITAL_COMMUNITY): Payer: Self-pay | Admitting: *Deleted

## 2013-03-07 DIAGNOSIS — Z3202 Encounter for pregnancy test, result negative: Secondary | ICD-10-CM | POA: Insufficient documentation

## 2013-03-07 DIAGNOSIS — R109 Unspecified abdominal pain: Secondary | ICD-10-CM | POA: Insufficient documentation

## 2013-03-07 DIAGNOSIS — N390 Urinary tract infection, site not specified: Secondary | ICD-10-CM | POA: Insufficient documentation

## 2013-03-07 DIAGNOSIS — R319 Hematuria, unspecified: Secondary | ICD-10-CM | POA: Insufficient documentation

## 2013-03-07 LAB — URINALYSIS, ROUTINE W REFLEX MICROSCOPIC
Bilirubin Urine: NEGATIVE
Nitrite: NEGATIVE
Specific Gravity, Urine: 1.014 (ref 1.005–1.030)
Urobilinogen, UA: 1 mg/dL (ref 0.0–1.0)
pH: 7.5 (ref 5.0–8.0)

## 2013-03-07 MED ORDER — CEFTRIAXONE SODIUM 1 G IJ SOLR
1.0000 g | Freq: Once | INTRAMUSCULAR | Status: AC
  Administered 2013-03-07: 1 g via INTRAMUSCULAR
  Filled 2013-03-07: qty 10

## 2013-03-07 MED ORDER — PHENAZOPYRIDINE HCL 200 MG PO TABS
200.0000 mg | ORAL_TABLET | Freq: Three times a day (TID) | ORAL | Status: DC
  Administered 2013-03-07: 200 mg via ORAL

## 2013-03-07 MED ORDER — IBUPROFEN 800 MG PO TABS
800.0000 mg | ORAL_TABLET | Freq: Once | ORAL | Status: AC
  Administered 2013-03-07: 800 mg via ORAL
  Filled 2013-03-07: qty 1

## 2013-03-07 MED ORDER — NITROFURANTOIN MONOHYD MACRO 100 MG PO CAPS: 100.0000 mg | ORAL_CAPSULE | Freq: Two times a day (BID) | ORAL | Status: DC

## 2013-03-07 MED ORDER — LIDOCAINE HCL 1 % IJ SOLN
INTRAMUSCULAR | Status: AC
  Administered 2013-03-07: 2.1 mL
  Filled 2013-03-07: qty 20

## 2013-03-07 MED ORDER — PHENAZOPYRIDINE HCL 200 MG PO TABS
200.0000 mg | ORAL_TABLET | Freq: Three times a day (TID) | ORAL | Status: DC
  Filled 2013-03-07: qty 1

## 2013-03-07 MED ORDER — DEXTROSE 5 % IV SOLN: 1.0000 g | Freq: Once | INTRAVENOUS | Status: DC

## 2013-03-07 MED ORDER — PHENAZOPYRIDINE HCL 200 MG PO TABS: 200.0000 mg | ORAL_TABLET | Freq: Three times a day (TID) | ORAL | Status: DC | PRN

## 2013-03-07 NOTE — ED Notes (Signed)
Pt states a couple hours ago, started to feel a lot of pressure in bladder area

## 2013-03-07 NOTE — ED Provider Notes (Signed)
History     CSN: [id number]  Arrival date & time 03/07/13  0103   First MD Initiated Contact with Patient 03/07/13 0115      Chief Complaint  Patient presents with  . Urinary Frequency    pressure   HPI  History provided by the patient. Patient is a 22 year old female with no significant PMH who presents with complaints of lower abdominal pressure and urinary frequency. Patient states that she began to have lower abdominal pressure and sharp pains with urination approximately 2 hours prior to arrival to the emergency room. She also reports having sudden increased urgency and frequency of urination over the past 2 hours. She states she has been going very small amounts or sometimes not at all but feeling the urge to urinate. Pain does not radiate. Denies any flank pains. There is no associated fever, chills or sweats. No nausea vomiting. She does report small amounts of pink urine or blood in urine. Denies any vaginal bleeding or discharge. Last normal menstrual period on the 21st. No other aggravating or alleviating factors. No other associated symptoms.     History reviewed. No pertinent past medical history.  History reviewed. No pertinent past surgical history.  No family history on file.  History  Substance Use Topics  . Smoking status: Never Smoker   . Smokeless tobacco: Not on file  . Alcohol Use: No    OB History   Grav Para Term Preterm Abortions TAB SAB Ect Mult Living                  Review of Systems  Constitutional: Negative for fever, chills, diaphoresis and appetite change.  Gastrointestinal: Positive for abdominal pain. Negative for nausea, vomiting, diarrhea, constipation and blood in stool.  Genitourinary: Positive for frequency and hematuria. Negative for dysuria, flank pain, vaginal bleeding, vaginal discharge and menstrual problem.  All other systems reviewed and are negative.    Allergies  Review of patient's allergies indicates no known  allergies.  Home Medications   Current Outpatient Rx  Name  Route  Sig  Dispense  Refill  . HYDROcodone-acetaminophen (NORCO) 5-325 MG per tablet   Oral   Take 1-2 tablets by mouth every 4 (four) hours as needed for pain.   10 tablet   0     BP 122/79  Pulse 95  Temp(Src) 98.8 F (37.1 C) (Oral)  Resp 18  SpO2 100%  LMP 02/16/2013  Physical Exam  Nursing note and vitals reviewed. Constitutional: She is oriented to person, place, and time. She appears well-developed and well-nourished. No distress.  HENT:  Head: Normocephalic.  Cardiovascular: Normal rate and regular rhythm.   No murmur heard. Pulmonary/Chest: Effort normal and breath sounds normal. No respiratory distress. She has no wheezes.  Abdominal: Soft. There is tenderness in the suprapubic area. There is no rebound, no guarding, no CVA tenderness, no tenderness at McBurney's point and negative Murphy's sign.  Musculoskeletal: Normal range of motion.  Neurological: She is alert and oriented to person, place, and time.  Skin: Skin is warm and dry. No rash noted.  Psychiatric: She has a normal mood and affect. Her behavior is normal.    ED Course  Procedures   Results for orders placed during the hospital encounter of 03/07/13  URINALYSIS, ROUTINE W REFLEX MICROSCOPIC      Result Value Range   Color, Urine RED (*) YELLOW   APPearance TURBID (*) CLEAR   Specific Gravity, Urine 1.014  1.005 - 1.030  pH 7.5  5.0 - 8.0   Glucose, UA NEGATIVE  NEGATIVE mg/dL   Hgb urine dipstick LARGE (*) NEGATIVE   Bilirubin Urine NEGATIVE  NEGATIVE   Ketones, ur NEGATIVE  NEGATIVE mg/dL   Protein, ur 161 (*) NEGATIVE mg/dL   Urobilinogen, UA 1.0  0.0 - 1.0 mg/dL   Nitrite NEGATIVE  NEGATIVE   Leukocytes, UA LARGE (*) NEGATIVE  URINE MICROSCOPIC-ADD ON      Result Value Range   WBC, UA TOO NUMEROUS TO COUNT  <3 WBC/hpf   RBC / HPF TOO NUMEROUS TO COUNT  <3 RBC/hpf  POCT PREGNANCY, URINE      Result Value Range   Preg  Test, Ur NEGATIVE  NEGATIVE        1. UTI (lower urinary tract infection)       MDM  At 1:20 AM patient seen and evaluated. Patient sitting calmly in no acute distress. She appears well nontoxic.       Angus Seller, PA-C 03/07/13 [phone]

## 2013-03-07 NOTE — ED Provider Notes (Signed)
Medical screening examination/treatment/procedure(s) were performed by non-physician practitioner and as supervising physician I was immediately available for consultation/collaboration.   Hanley Seamen, MD 03/07/13 [phone]

## 2013-03-09 LAB — URINE CULTURE: Colony Count: >=100000

## 2013-03-11 NOTE — ED Notes (Signed)
Post ED Visit - Positive Culture Follow-up  Culture report reviewed by antimicrobial stewardship pharmacist: [x]  Wes Dulaney, Pharm.D., BCPS []  Celedonio Miyamoto, [address].D., BCPS []  Georgina Pillion, Pharm.D., BCPS []  [place], [place].D., BCPS, AAHIVP []  Estella Husk, Pharm.D., BCPS, AAHIVP  Positive urine culture Treated with Nitrofurantoin, organism sensitive to the same and no further patient follow-up is required at this time.  Larena Sox 03/11/2013, 11:50 AM

## 2013-03-14 ENCOUNTER — Encounter: Payer: Self-pay | Admitting: Obstetrics & Gynecology

## 2013-03-22 ENCOUNTER — Ambulatory Visit (INDEPENDENT_AMBULATORY_CARE_PROVIDER_SITE_OTHER): Payer: Self-pay | Admitting: Surgery

## 2013-03-22 ENCOUNTER — Telehealth (INDEPENDENT_AMBULATORY_CARE_PROVIDER_SITE_OTHER): Payer: Self-pay

## 2013-03-22 NOTE — Telephone Encounter (Signed)
LMOM for pt to call back re: missed appt today.

## 2013-04-06 ENCOUNTER — Ambulatory Visit (INDEPENDENT_AMBULATORY_CARE_PROVIDER_SITE_OTHER): Payer: Medicaid Other | Admitting: Obstetrics & Gynecology

## 2013-04-06 ENCOUNTER — Encounter: Payer: Self-pay | Admitting: Obstetrics & Gynecology

## 2013-04-06 VITALS — BP 117/77 | HR 81 | Temp 98.4°F | Wt 108.0 lb

## 2013-04-06 DIAGNOSIS — A499 Bacterial infection, unspecified: Secondary | ICD-10-CM

## 2013-04-06 DIAGNOSIS — B9689 Other specified bacterial agents as the cause of diseases classified elsewhere: Secondary | ICD-10-CM

## 2013-04-06 DIAGNOSIS — N760 Acute vaginitis: Secondary | ICD-10-CM

## 2013-04-06 LAB — POCT WET PREP (WET MOUNT): Clue Cells Wet Prep Whiff POC: POSITIVE

## 2013-04-06 MED ORDER — METRONIDAZOLE 500 MG PO TABS: 500.0000 mg | ORAL_TABLET | Freq: Two times a day (BID) | ORAL | Status: DC

## 2013-04-06 NOTE — Progress Notes (Signed)
Subjective:     Stacy Kelley is a 22 y.o. female here for problem visit.  Current complaints: thick white vaginal discharge.  Personal health questionnaire reviewed: not asked.   Gynecologic History Patient's last menstrual period was 03/15/2013. Contraception: condoms    The following portions of the patient's history were reviewed and updated as appropriate: allergies, current medications, past family history, past medical history, past social history, past surgical history and problem list.  Review of Systems Pertinent items are noted in HPI.    Objective:   SPEC: white discharge  Assessment:   Bacterial vaginosis  Plan:   Metronidazole

## 2013-04-06 NOTE — Patient Instructions (Addendum)
Bacterial Vaginosis Bacterial vaginosis (BV) is a vaginal infection where the normal balance of bacteria in the vagina is disrupted. The normal balance is then replaced by an overgrowth of certain bacteria. There are several different kinds of bacteria that can cause BV. BV is the most common vaginal infection in women of childbearing age. CAUSES   The cause of BV is not fully understood. BV develops when there is an increase or imbalance of harmful bacteria.  Some activities or behaviors can upset the normal balance of bacteria in the vagina and put women at increased risk including:  Having a new sex partner or multiple sex partners.  Douching.  Using an intrauterine device (IUD) for contraception.  It is not clear what role sexual activity plays in the development of BV. However, women that have never had sexual intercourse are rarely infected with BV. Women do not get BV from toilet seats, bedding, swimming pools or from touching objects around them.  SYMPTOMS   Grey vaginal discharge.  A fish-like odor with discharge, especially after sexual intercourse.  Itching or burning of the vagina and vulva.  Burning or pain with urination.  Some women have no signs or symptoms at all. DIAGNOSIS  Your caregiver must examine the vagina for signs of BV. Your caregiver will perform lab tests and look at the sample of vaginal fluid through a microscope. They will look for bacteria and abnormal cells (clue cells), a pH test higher than 4.5, and a positive amine test all associated with BV.  RISKS AND COMPLICATIONS   Pelvic inflammatory disease (PID).  Infections following gynecology surgery.  Developing HIV.  Developing herpes virus. TREATMENT  Sometimes BV will clear up without treatment. However, all women with symptoms of BV should be treated to avoid complications, especially if gynecology surgery is planned. Female partners generally do not need to be treated. However, BV may spread  between female sex partners so treatment is helpful in preventing a recurrence of BV.   BV may be treated with antibiotics. The antibiotics come in either pill or vaginal cream forms. Either can be used with nonpregnant or pregnant women, but the recommended dosages differ. These antibiotics are not harmful to the baby.  BV can recur after treatment. If this happens, a second round of antibiotics will often be prescribed.  Treatment is important for pregnant women. If not treated, BV can cause a premature delivery, especially for a pregnant woman who had a premature birth in the past. All pregnant women who have symptoms of BV should be checked and treated.  For chronic reoccurrence of BV, treatment with a type of prescribed gel vaginally twice a week is helpful. HOME CARE INSTRUCTIONS   Finish all medication as directed by your caregiver.  Do not have sex until treatment is completed.  Tell your sexual partner that you have a vaginal infection. They should see their caregiver and be treated if they have problems, such as a mild rash or itching.  Practice safe sex. Use condoms. Only have 1 sex partner. PREVENTION  Basic prevention steps can help reduce the risk of upsetting the natural balance of bacteria in the vagina and developing BV:  Do not have sexual intercourse (be abstinent).  Do not douche.  Use all of the medicine prescribed for treatment of BV, even if the signs and symptoms go away.  Tell your sex partner if you have BV. That way, they can be treated, if needed, to prevent reoccurrence. SEEK MEDICAL CARE IF:     Your symptoms are not improving after 3 days of treatment.  You have increased discharge, pain, or fever. MAKE SURE YOU:   Understand these instructions.  Will watch your condition.  Will get help right away if you are not doing well or get worse. FOR MORE INFORMATION  Division of STD Prevention (DSTDP), Centers for Disease Control and Prevention:  www.cdc.gov/std American Social Health Association (ASHA): www.ashastd.org  Document Released: 09/15/2005 Document Revised: 12/08/2011 Document Reviewed: 03/08/2009 Exit[employer] Patient Information 2014 [employer].  

## 2013-04-11 ENCOUNTER — Encounter (HOSPITAL_COMMUNITY): Payer: Self-pay | Admitting: Emergency Medicine

## 2013-04-11 ENCOUNTER — Emergency Department (HOSPITAL_COMMUNITY)
Admission: EM | Admit: 2013-04-11 | Discharge: 2013-04-12 | Disposition: A | Discharge status: Home / Self Care | Payer: Self-pay | Attending: Emergency Medicine | Admitting: Emergency Medicine

## 2013-04-11 DIAGNOSIS — T50902A Poisoning by unspecified drugs, medicaments and biological substances, intentional self-harm, initial encounter: Secondary | ICD-10-CM | POA: Insufficient documentation

## 2013-04-11 DIAGNOSIS — F3289 Other specified depressive episodes: Secondary | ICD-10-CM | POA: Insufficient documentation

## 2013-04-11 DIAGNOSIS — T50991A Poisoning by other drugs, medicaments and biological substances, accidental (unintentional), initial encounter: Secondary | ICD-10-CM | POA: Insufficient documentation

## 2013-04-11 DIAGNOSIS — F329 Major depressive disorder, single episode, unspecified: Secondary | ICD-10-CM | POA: Insufficient documentation

## 2013-04-11 DIAGNOSIS — Z3202 Encounter for pregnancy test, result negative: Secondary | ICD-10-CM | POA: Insufficient documentation

## 2013-04-11 DIAGNOSIS — R45851 Suicidal ideations: Secondary | ICD-10-CM | POA: Insufficient documentation

## 2013-04-11 DIAGNOSIS — Z8619 Personal history of other infectious and parasitic diseases: Secondary | ICD-10-CM | POA: Insufficient documentation

## 2013-04-11 LAB — COMPREHENSIVE METABOLIC PANEL
ALT: 17 U/L (ref 0–35)
AST: 20 U/L (ref 0–37)
Albumin: 4.2 g/dL (ref 3.5–5.2)
Alkaline Phosphatase: 50 U/L (ref 39–117)
BUN: 14 mg/dL (ref 6–23)
Chloride: 103 mEq/L (ref 96–112)
Potassium: 2.8 mEq/L — ABNORMAL LOW (ref 3.5–5.1)
Total Bilirubin: 0.4 mg/dL (ref 0.3–1.2)

## 2013-04-11 LAB — CBC WITH DIFFERENTIAL/PLATELET
Basophils Absolute: 0 10*3/uL (ref 0.0–0.1)
Basophils Relative: 1 % (ref 0–1)
Hemoglobin: 12.5 g/dL (ref 12.0–15.0)
MCHC: 32.3 g/dL (ref 30.0–36.0)
Monocytes Relative: 13 % — ABNORMAL HIGH (ref 3–12)
Neutro Abs: 1.2 10*3/uL — ABNORMAL LOW (ref 1.7–7.7)
Neutrophils Relative %: 41 % — ABNORMAL LOW (ref 43–77)

## 2013-04-11 LAB — RAPID URINE DRUG SCREEN, HOSP PERFORMED
Barbiturates: NOT DETECTED
Cocaine: NOT DETECTED
Tetrahydrocannabinol: NOT DETECTED

## 2013-04-11 LAB — ACETAMINOPHEN LEVEL: Acetaminophen (Tylenol), Serum: <15 ug/mL (ref 10–30)

## 2013-04-11 MED ORDER — POTASSIUM CHLORIDE CRYS ER 20 MEQ PO TBCR
40.0000 meq | EXTENDED_RELEASE_TABLET | Freq: Once | ORAL | Status: AC
  Administered 2013-04-11: 40 meq via ORAL
  Filled 2013-04-11: qty 2

## 2013-04-11 NOTE — ED Notes (Signed)
PT. REPORTED INGESTED 10 TABS OF " MIGRAINE PILLS" AT 7 PM THIS EVENING , PT. ADMITTED SUICIDE ATTEMPT , RESPIRATIONS UNLABORED / ALERT AND ORIENTED / AMBULATORY .

## 2013-04-11 NOTE — ED Notes (Signed)
CHARGE NURSE AND  AC NOTIFIED FOR SITTER . SECURITY PAGED BY SECRETARY TO WAND PT. , PT. WEARING PAPER SCRUBS.

## 2013-04-11 NOTE — ED Notes (Signed)
MD at bedside. 

## 2013-04-12 MED ORDER — ONDANSETRON 4 MG PO TBDP
4.0000 mg | ORAL_TABLET | Freq: Once | ORAL | Status: AC
  Administered 2013-04-12: 4 mg via ORAL
  Filled 2013-04-12: qty 1

## 2013-04-12 MED ORDER — ONDANSETRON HCL 4 MG/2ML IJ SOLN: 4.0000 mg | Freq: Once | INTRAMUSCULAR | Status: DC

## 2013-04-12 NOTE — ED Notes (Signed)
Patient family returned her call.  They are enroute to pick her up.  Patient verbalized understanding of discharge instructions and safety plan.

## 2013-04-12 NOTE — ED Notes (Signed)
Patient telepsych completed.  Ok to be discharged home.  ACT talking with MD regarding recommended plan and possible safety contract with patient

## 2013-04-12 NOTE — ED Notes (Addendum)
Patient unable to find a ride home.  Will contact SW at [phone] for possible cab voucher, patient family took her belongings home.

## 2013-04-12 NOTE — ED Notes (Signed)
Patient continues to try to find a ride home

## 2013-04-12 NOTE — ED Provider Notes (Signed)
Medical screening examination/treatment/procedure(s) were performed by non-physician practitioner and as supervising physician I was immediately available for consultation/collaboration.    Quenna Doepke J. Sovereign Ramiro, MD 04/12/13 1613 

## 2013-04-12 NOTE — ED Notes (Signed)
Patient continues to attempt to call for ride

## 2013-04-12 NOTE — ED Notes (Signed)
Patient is complaining of upset stomach,  Will request medication.  No other complaints at this time.  Sitter at bedside.  Awaiting telepsych at this time

## 2013-04-12 NOTE — ED Provider Notes (Signed)
Psychiatry consultation. Patient is not a threat to self or others and is able to contract for safety. Ingestion was nontoxic. She has been given outpatient referrals by ACT Team.  Dione Booze, MD 04/12/13 [phone]

## 2013-04-12 NOTE — BH Assessment (Signed)
Assessment Note   Stacy Kelley is an 22 y.o. female.  Patient was brought to [facility] by private vehicle.  Patient had taken 10 pills of 325mg  Excedrin migraine.  Patient had told nurse initially that it was a suicide attempt.  Patient now says that she does not know that it was a suicide attempt.  She says that it was more like she was "stressed out."  Patient denies any current SI, intention or plan.  Patient denies any HI or A/V hallucinations.  No prior attempt to kill self.  Patient says that stress of work and raising 54 year old son and having son's father in the home makes her anxious.  Patient's demeanor is inconsistent with why she is at ED tonight.  She smiles and laughs at times and appears neutral to upbeat in disposition.  Patient denies any current depressive symptoms.  Patient does say that she can contract for safety.  Patient inconsistency was discussed with Ebbie Ridge, PA so a telepsychiatry consult was ordered to determine if inpatient care was necessary.   Axis I: Generalized Anxiety Disorder Axis II: Deferred Axis III:  Past Medical History  Diagnosis Date  . Genital herpes    Axis IV: economic problems Axis V: 61-70 mild symptoms  Past Medical History:  Past Medical History  Diagnosis Date  . Genital herpes     Past Surgical History  Procedure Laterality Date  . No past surgeries      Family History: No family history on file.  Social History:  reports that she has never smoked. She has never used smokeless tobacco. She reports that she does not drink alcohol or use illicit drugs.  Additional Social History:  Alcohol / Drug Use Pain Medications: Pt denies any current medications Prescriptions: Pt denies any currnt meds Over the Counter: No medication reproted.  CIWA: CIWA-Ar BP: 101/52 mmHg Pulse Rate: 75 COWS:    Allergies: No Known Allergies  Home Medications:  (Not in a hospital admission)  OB/GYN Status:  Patient's last menstrual period was  03/15/2013.  General Assessment Data Location of Assessment: [facility] Living Arrangements: Spouse/significant other;Children (with son's father and 74 yr old son) Can pt return to current living arrangement?: Yes Admission Status: Voluntary Is patient capable of signing voluntary admission?: Yes Transfer from: Acute Hospital Referral Source: Self/Family/Friend     Risk to self Suicidal Ideation: No-Not Currently/Within Last 6 Months Suicidal Intent: No Is patient at risk for suicide?: No Suicidal Plan?: No (Pt did try to OD on OTC medicine.  Motivation unclear.) Access to Means: Yes Specify Access to Suicidal Means: OTC meds What has been your use of drugs/alcohol within the last 12 months?: None reported. Previous Attempts/Gestures: No How many times?: 0 Other Self Harm Risks: None Triggers for Past Attempts: None known Intentional Self Injurious Behavior: None Family Suicide History: No Recent stressful life event(s):  (Pt denies any specific trigger) Persecutory voices/beliefs?: No Depression: No Depression Symptoms:  (Pt denies any current depressive symptoms) Substance abuse history and/or treatment for substance abuse?: No Suicide prevention information given to non-admitted patients: Not applicable  Risk to Others Homicidal Ideation: No Thoughts of Harm to Others: No Current Homicidal Intent: No Current Homicidal Plan: No Access to Homicidal Means: No Identified Victim: No one History of harm to others?: No Assessment of Violence: None Noted Violent Behavior Description: N/A Does patient have access to weapons?: No Criminal Charges Pending?: No Does patient have a court date: No  Psychosis Hallucinations: None noted  Delusions: None noted  Mental Status Report Appear/Hygiene:  (Casual) Eye Contact: Good Motor Activity: Freedom of movement;Unremarkable Speech: Logical/coherent Level of Consciousness: Alert Mood: Silly Affect: Inconsistent with thought  content (Pt embarrased, smiles, giggles at times.) Anxiety Level: Moderate Thought Processes: Coherent;Relevant Judgement: Unimpaired Orientation: Person;Place;Time;Situation Obsessive Compulsive Thoughts/Behaviors: None  Cognitive Functioning Concentration: Normal Memory: Recent Intact;Remote Intact IQ: Average Insight: Fair Impulse Control: Poor Appetite: Good Weight Loss: 0 Weight Gain: 0 Sleep: No Change Total Hours of Sleep: 8 Vegetative Symptoms: None  ADLScreening [facility] Assessment Services) Patient's cognitive ability adequate to safely complete daily activities?: Yes Patient able to express need for assistance with ADLs?: Yes Independently performs ADLs?: Yes (appropriate for developmental age)  Abuse/Neglect [facility]) Physical Abuse: Denies Verbal Abuse: Denies Sexual Abuse: Denies  Prior Inpatient Therapy Prior Inpatient Therapy: No Prior Therapy Dates: N/A Prior Therapy Facilty/Provider(s): N/A Reason for Treatment: N/A  Prior Outpatient Therapy Prior Outpatient Therapy: No Prior Therapy Dates: None Prior Therapy Facilty/Provider(s): None Reason for Treatment: None  ADL Screening (condition at time of admission) Patient's cognitive ability adequate to safely complete daily activities?: Yes Is the patient deaf or have difficulty hearing?: No Does the patient have difficulty seeing, even when wearing glasses/contacts?: No Does the patient have difficulty concentrating, remembering, or making decisions?: No Patient able to express need for assistance with ADLs?: Yes Does the patient have difficulty dressing or bathing?: No Independently performs ADLs?: Yes (appropriate for developmental age) Does the patient have difficulty walking or climbing stairs?: No Weakness of Legs: None Weakness of Arms/Hands: None  Home Assistive Devices/Equipment Home Assistive Devices/Equipment: None    Abuse/Neglect Assessment (Assessment to be complete while patient is  alone) Physical Abuse: Denies Verbal Abuse: Denies Sexual Abuse: Denies Exploitation of patient/patient's resources: Denies Self-Neglect: Denies     Merchant navy officer (For Healthcare) Advance Directive: Patient does not have advance directive;Patient would not like information    Additional Information 1:1 In Past 12 Months?: No CIRT Risk: No Elopement Risk: No Does patient have medical clearance?: Yes     Disposition:  Disposition Initial Assessment Completed for this Encounter: Yes Disposition of Patient: Outpatient treatment;Referred to Type of outpatient treatment: Adult Patient referred to:  (Pt to have a telepsychiatric consult)  On Site Evaluation by:   Reviewed with Physician:  Charlestine Night, PA   Beatriz Stallion Ray 04/12/2013 4:07 AM

## 2013-04-12 NOTE — ED Notes (Signed)
Patient has not been able to contact her family for ride home.  Continues to try

## 2013-04-12 NOTE — ED Provider Notes (Signed)
   History    CSN: [id number] Arrival date & time 04/11/13  2027  First MD Initiated Contact with Patient 04/11/13 2320     Chief Complaint  Patient presents with  . Suicide Attempt  . Drug Overdose   (Consider location/radiation/quality/duration/timing/severity/associated sxs/prior Treatment) HPI Patient presents to the emergency department with suicidal attempt.  Patient attempted suicide earlier this evening by taking 10 Excedrin Migraine.patient does not give me much history.  Her boyfriend is giving me most of the history. the patient will answer some questions, but not all of them. patient has not had any vomiting, diarrhea, chest pain, shortness of breath, hallucinations, weakness, numbness, dizziness, or headache.  Patient, states she's never attempted this before Past Medical History  Diagnosis Date  . Genital herpes    Past Surgical History  Procedure Laterality Date  . No past surgeries     No family history on file. History  Substance Use Topics  . Smoking status: Never Smoker   . Smokeless tobacco: Never Used  . Alcohol Use: No   OB History   Grav Para Term Preterm Abortions TAB SAB Ect Mult Living                 Review of Systems All other systems negative except as documented in the HPI. All pertinent positives and negatives as reviewed in the HPI.  Allergies  Review of patient's allergies indicates no known allergies.  Home Medications  No current outpatient prescriptions on file. BP 131/73  Temp(Src) 98.1 F (36.7 C) (Oral)  Resp 16  SpO2 100%  LMP 03/15/2013 Physical Exam  Nursing note and vitals reviewed. Constitutional: She is oriented to person, place, and time. She appears well-developed and well-nourished. No distress.  HENT:  Head: Normocephalic and atraumatic.  Mouth/Throat: Oropharynx is clear and moist.  Eyes: Pupils are equal, round, and reactive to light.  Neck: Normal range of motion. Neck supple.  Cardiovascular: Normal rate,  regular rhythm and normal heart sounds.  Exam reveals no gallop and no friction rub.   No murmur heard. Pulmonary/Chest: Effort normal and breath sounds normal. No respiratory distress.  Neurological: She is alert and oriented to person, place, and time. She exhibits normal muscle tone. Coordination normal.  Skin: Skin is warm and dry. No rash noted.  Psychiatric: Her mood appears not anxious. Her speech is not rapid and/or pressured, not delayed and not slurred. She is not agitated and not actively hallucinating. Thought content is not paranoid and not delusional. She exhibits a depressed mood. She expresses suicidal ideation. She expresses no homicidal ideation. She expresses suicidal plans. She expresses no homicidal plans.    ED Course  Procedures (including critical care time) Labs Reviewed  CBC WITH DIFFERENTIAL - Abnormal; Notable for the following:    WBC 2.8 (*)    Neutrophils Relative % 41 (*)    Neutro Abs 1.2 (*)    Monocytes Relative 13 (*)    All other components within normal limits  COMPREHENSIVE METABOLIC PANEL - Abnormal; Notable for the following:    Potassium 2.8 (*)    All other components within normal limits  ACETAMINOPHEN LEVEL  SALICYLATE LEVEL  ETHANOL  URINE RAPID DRUG SCREEN (HOSP PERFORMED)  POCT PREGNANCY, URINE   Patient will be assessed by the ACT Team.  Patient is stable at this time  MDM    Carlyle Dolly, PA-C 04/12/13 0016

## 2013-04-22 ENCOUNTER — Ambulatory Visit (INDEPENDENT_AMBULATORY_CARE_PROVIDER_SITE_OTHER): Payer: Self-pay | Admitting: Surgery

## 2013-04-22 ENCOUNTER — Telehealth (INDEPENDENT_AMBULATORY_CARE_PROVIDER_SITE_OTHER): Payer: Self-pay

## 2013-04-22 NOTE — Telephone Encounter (Signed)
Pt failed appt today. LMOM for pt to call. Will attempt to r/s appt when pt calls.

## 2013-04-26 ENCOUNTER — Encounter: Payer: Self-pay | Admitting: Obstetrics

## 2013-05-31 ENCOUNTER — Ambulatory Visit: Payer: Medicaid Other | Admitting: Advanced Practice Midwife

## 2013-05-31 ENCOUNTER — Inpatient Hospital Stay (HOSPITAL_COMMUNITY)
Admission: AD | Admit: 2013-05-31 | Discharge: 2013-05-31 | Disposition: A | Discharge status: Home / Self Care | Payer: Self-pay | Source: Ambulatory Visit | Attending: Obstetrics & Gynecology | Admitting: Obstetrics & Gynecology

## 2013-05-31 DIAGNOSIS — A499 Bacterial infection, unspecified: Secondary | ICD-10-CM | POA: Insufficient documentation

## 2013-05-31 DIAGNOSIS — N898 Other specified noninflammatory disorders of vagina: Secondary | ICD-10-CM

## 2013-05-31 DIAGNOSIS — N949 Unspecified condition associated with female genital organs and menstrual cycle: Secondary | ICD-10-CM | POA: Insufficient documentation

## 2013-05-31 DIAGNOSIS — N760 Acute vaginitis: Secondary | ICD-10-CM | POA: Insufficient documentation

## 2013-05-31 DIAGNOSIS — B9689 Other specified bacterial agents as the cause of diseases classified elsewhere: Secondary | ICD-10-CM | POA: Insufficient documentation

## 2013-05-31 LAB — URINALYSIS, ROUTINE W REFLEX MICROSCOPIC
Bilirubin Urine: NEGATIVE
Glucose, UA: NEGATIVE mg/dL
Ketones, ur: NEGATIVE mg/dL
pH: 6 (ref 5.0–8.0)

## 2013-05-31 LAB — URINE MICROSCOPIC-ADD ON

## 2013-05-31 LAB — WET PREP, GENITAL: Trich, Wet Prep: NONE SEEN

## 2013-05-31 MED ORDER — METRONIDAZOLE 500 MG PO TABS: 500.0000 mg | ORAL_TABLET | Freq: Three times a day (TID) | ORAL | Status: DC

## 2013-05-31 NOTE — MAU Provider Note (Signed)
CC: Vaginal Discharge    First Provider Initiated Contact with Patient 05/31/13 1711      HPI Stacy Kelley is a 22 y.o.  G1P1001 who presents with onset of malodorous, thin vaginal discharge a few days ago. She thinks it is BV. She was diagnosed with BV at [facility] 2 weeks ago and was prescribed one week of Flagyl but did take all the tablets because they made her sick. She has had several BV infections in the past and often gets them after her menses. Denies bleeding, abd pain, vaginal irritation or itching. Not on contraception.  LMP 05/07/13.   Past Medical History  Diagnosis Date  . Genital herpes     OB History  No data available    Past Surgical History  Procedure Laterality Date  . No past surgeries      History   Social History  . Marital Status: Single    Spouse Name: N/A    Number of Children: 1  . Years of Education: N/A   Occupational History  .  Unemployed   Social History Main Topics  . Smoking status: Never Smoker   . Smokeless tobacco: Never Used  . Alcohol Use: No  . Drug Use: No  . Sexual Activity: Not on file   Other Topics Concern  . Not on file   Social History Narrative  . No narrative on file    No current facility-administered medications on file prior to encounter.   No current outpatient prescriptions on file prior to encounter.    No Known Allergies  ROS Pertinent items in HPI  PHYSICAL EXAM Filed Vitals:   05/31/13 1457  BP: 124/98  Pulse: 99  Temp: 98.4 F (36.9 C)  Resp: 16   General: Well nourished, well developed female in no acute distress Cardiovascular: Normal rate Respiratory: Normal effort Abdomen: Soft, nontender Back: No CVAT Extremities: No edema Neurologic: Alert and oriented Speculum exam: NEFG; vagina with thin gray discharge, no blood; cervix clean Bimanual exam: no CMT; uterus NSSP; no adnexal tenderness or masses   LAB RESULTS Results for orders placed during the hospital encounter of 05/31/13  (from the past 24 hour(s))  URINALYSIS, ROUTINE W REFLEX MICROSCOPIC     Status: Abnormal   Collection Time    05/31/13  2:55 PM      Result Value Range   Color, Urine YELLOW  YELLOW   APPearance CLEAR  CLEAR   Specific Gravity, Urine >1.030 (*) 1.005 - 1.030   pH 6.0  5.0 - 8.0   Glucose, UA NEGATIVE  NEGATIVE mg/dL   Hgb urine dipstick TRACE (*) NEGATIVE   Bilirubin Urine NEGATIVE  NEGATIVE   Ketones, ur NEGATIVE  NEGATIVE mg/dL   Protein, ur NEGATIVE  NEGATIVE mg/dL   Urobilinogen, UA 0.2  0.0 - 1.0 mg/dL   Nitrite NEGATIVE  NEGATIVE   Leukocytes, UA NEGATIVE  NEGATIVE  URINE MICROSCOPIC-ADD ON     Status: Abnormal   Collection Time    05/31/13  2:55 PM      Result Value Range   Squamous Epithelial / LPF FEW (*) RARE   RBC / HPF 0-2  <3 RBC/hpf   Urine-Other MUCOUS PRESENT    POCT PREGNANCY, URINE     Status: None   Collection Time    05/31/13  4:21 PM      Result Value Range   Preg Test, Ur NEGATIVE  NEGATIVE  WET PREP, GENITAL     Status: Abnormal  Collection Time    05/31/13  5:20 PM      Result Value Range   Yeast Wet Prep HPF POC FEW (*) NONE SEEN   Trich, Wet Prep NONE SEEN  NONE SEEN   Clue Cells Wet Prep HPF POC FEW (*) NONE SEEN   WBC, Wet Prep HPF POC FEW (*) NONE SEEN     ASSESSMENT  1. Vaginal discharge   2. BV (bacterial vaginosis)   recurrent  PLAN Discharge home. See AVS for patient education. Advised condoms. Discussed vaginal lubricant to normalize pH were talked her primary provider regarding boric acid to alter pH. Offered Metrogel>declined   Medication List         metroNIDAZOLE 500 MG tablet  Commonly known as:  FLAGYL  Take 1 tablet (500 mg total) by mouth 3 (three) times daily.       Follow-up Information   Follow up with [facility]. (As needed)    Contact information:   [address] [place] [address] [phone]        Danae Orleans, CNM 05/31/2013 5:14 PM

## 2013-05-31 NOTE — Discharge Instructions (Signed)
Vaginitis Vaginitis is an inflammation of the vagina. It is most often caused by a change in the normal balance of the bacteria and yeast that live in the vagina. This change in balance causes an overgrowth of certain bacteria or yeast, which causes the inflammation. There are different types of vaginitis, but the most common types are:  Bacterial vaginosis.  Yeast infection (candidiasis).  Trichomoniasis vaginitis. This is a sexually transmitted infection (STI).  Viral vaginitis.  Atropic vaginitis.  Allergic vaginitis. CAUSES  The cause depends on the type of vaginitis. Vaginitis can be caused by:  Bacteria (bacterial vaginosis).  Yeast (yeast infection).  A parasite (trichomoniasis vaginitis)  A virus (viral vaginitis).  Low hormone levels (atrophic vaginitis). Low hormone levels can occur during pregnancy, breastfeeding, or after menopause.  Irritants, such as bubble baths, scented tampons, and feminine sprays (allergic vaginitis). Other factors can change the normal balance of the yeast and bacteria that live in the vagina. These include:  Antibiotic medicines.  Poor hygiene.  Diaphragms, vaginal sponges, spermicides, birth control pills, and intrauterine devices (IUD).  Sexual intercourse.  Infection.  Uncontrolled diabetes.  A weakened immune system. SYMPTOMS  Symptoms can vary depending on the cause of the vaginitis. Common symptoms include:  Abnormal vaginal discharge.  The discharge is white, gray, or yellow with bacterial vaginosis.  The discharge is thick, white, and cheesy with a yeast infection.  The discharge is frothy and yellow or greenish with trichomoniasis.  A bad vaginal odor.  The odor is fishy with bacterial vaginosis.  Vaginal itching, pain, or swelling.  Painful intercourse.  Pain or burning when urinating. Sometimes, there are no symptoms. TREATMENT  Treatment will vary depending on the type of infection.   Bacterial  vaginosis and trichomoniasis are often treated with antibiotic creams or pills.  Yeast infections are often treated with antifungal medicines, such as vaginal creams or suppositories.  Viral vaginitis has no cure, but symptoms can be treated with medicines that relieve discomfort. Your sexual partner should be treated as well.  Atrophic vaginitis may be treated with an estrogen cream, pill, suppository, or vaginal ring. If vaginal dryness occurs, lubricants and moisturizing creams may help. You may be told to avoid scented soaps, sprays, or douches.  Allergic vaginitis treatment involves quitting the use of the product that is causing the problem. Vaginal creams can be used to treat the symptoms. HOME CARE INSTRUCTIONS   Take all medicines as directed by your caregiver.  Keep your genital area clean and dry. Avoid soap and only rinse the area with water.  Avoid douching. It can remove the healthy bacteria in the vagina.  Do not use tampons or have sexual intercourse until your vaginitis has been treated. Use sanitary pads while you have vaginitis.  Wipe from front to back. This avoids the spread of bacteria from the rectum to the vagina.  Let air reach your genital area.  Wear cotton underwear to decrease moisture buildup.  Avoid wearing underwear while you sleep until your vaginitis is gone.  Avoid tight pants and underwear or nylons without a cotton panel.  Take off wet clothing (especially bathing suits) as soon as possible.  Use mild, non-scented products. Avoid using irritants, such as:  Scented feminine sprays.  Fabric softeners.  Scented detergents.  Scented tampons.  Scented soaps or bubble baths.  Practice safe sex and use condoms. Condoms may prevent the spread of trichomoniasis and viral vaginitis. SEEK MEDICAL CARE IF:   You have abdominal pain.  You   have a fever or persistent symptoms for more than 2 3 days.  You have a fever and your symptoms suddenly  get worse. Document Released: 07/13/2007 Document Revised: 06/09/2012 Document Reviewed: 02/26/2012 Exit[employer] Patient Information 2014 [employer].  

## 2013-05-31 NOTE — MAU Note (Signed)
Patient is in with c/o persistent white odorous vaginal discharge. She states that she was treated for BV 2 weeks and now the discharge is back. lmp 8/9. Patient also wants BC pills

## 2013-06-01 LAB — GC/CHLAMYDIA PROBE AMP
CT Probe RNA: NEGATIVE
GC Probe RNA: POSITIVE — AB

## 2013-08-15 ENCOUNTER — Inpatient Hospital Stay (HOSPITAL_COMMUNITY): Payer: Self-pay

## 2013-08-15 ENCOUNTER — Encounter (HOSPITAL_COMMUNITY): Payer: Self-pay | Admitting: *Deleted

## 2013-08-15 ENCOUNTER — Inpatient Hospital Stay (HOSPITAL_COMMUNITY)
Admission: AD | Admit: 2013-08-15 | Discharge: 2013-08-15 | Disposition: A | Discharge status: Home / Self Care | Payer: Self-pay | Source: Ambulatory Visit | Attending: Family Medicine | Admitting: Family Medicine

## 2013-08-15 DIAGNOSIS — O039 Complete or unspecified spontaneous abortion without complication: Secondary | ICD-10-CM | POA: Insufficient documentation

## 2013-08-15 DIAGNOSIS — R109 Unspecified abdominal pain: Secondary | ICD-10-CM | POA: Insufficient documentation

## 2013-08-15 DIAGNOSIS — N898 Other specified noninflammatory disorders of vagina: Secondary | ICD-10-CM | POA: Insufficient documentation

## 2013-08-15 LAB — URINALYSIS, ROUTINE W REFLEX MICROSCOPIC
Leukocytes, UA: NEGATIVE
Nitrite: NEGATIVE
Specific Gravity, Urine: 1.02 (ref 1.005–1.030)
Urobilinogen, UA: 0.2 mg/dL (ref 0.0–1.0)
pH: 6 (ref 5.0–8.0)

## 2013-08-15 LAB — CBC
MCV: 85.3 fL (ref 78.0–100.0)
Platelets: 177 10*3/uL (ref 150–400)
RBC: 4.42 MIL/uL (ref 3.87–5.11)
RDW: 13.9 % (ref 11.5–15.5)
WBC: 3.4 10*3/uL — ABNORMAL LOW (ref 4.0–10.5)

## 2013-08-15 LAB — URINE MICROSCOPIC-ADD ON

## 2013-08-15 LAB — POCT PREGNANCY, URINE: Preg Test, Ur: POSITIVE — AB

## 2013-08-15 LAB — HCG, QUANTITATIVE, PREGNANCY: hCG, Beta Chain, Quant, S: 6594 m[IU]/mL — ABNORMAL HIGH (ref <5)

## 2013-08-15 LAB — WET PREP, GENITAL
Trich, Wet Prep: NONE SEEN
Yeast Wet Prep HPF POC: NONE SEEN

## 2013-08-15 LAB — ABO/RH: ABO/RH(D): O POS

## 2013-08-15 MED ORDER — OXYCODONE-ACETAMINOPHEN 5-325 MG PO TABS: 1.0000 | ORAL_TABLET | Freq: Four times a day (QID) | ORAL | Status: DC | PRN

## 2013-08-15 MED ORDER — PROMETHAZINE HCL 25 MG PO TABS: 12.5000 mg | ORAL_TABLET | Freq: Four times a day (QID) | ORAL | Status: DC | PRN

## 2013-08-15 MED ORDER — MISOPROSTOL 200 MCG PO TABS
800.0000 ug | ORAL_TABLET | ORAL | Status: AC
  Administered 2013-08-15: 800 ug via VAGINAL
  Filled 2013-08-15: qty 4

## 2013-08-15 MED ORDER — IBUPROFEN 600 MG PO TABS
600.0000 mg | ORAL_TABLET | ORAL | Status: AC
  Administered 2013-08-15: 600 mg via ORAL
  Filled 2013-08-15: qty 1

## 2013-08-15 MED ORDER — IBUPROFEN 600 MG PO TABS: 600.0000 mg | ORAL_TABLET | ORAL | Status: DC

## 2013-08-15 NOTE — MAU Provider Note (Signed)
Chart reviewed and agree with management and plan.  

## 2013-08-15 NOTE — MAU Note (Signed)
Patient states she had a positive home pregnancy test yesterday. States she has had a vaginal discharge with a slight odor for a while but started having a little spotting off and on since 11-14.

## 2013-08-15 NOTE — MAU Provider Note (Signed)
Chief Complaint: Possible Pregnancy and Vaginal Discharge   None    SUBJECTIVE HPI: Stacy Kelley is a 22 y.o. G1P0 at [redacted]w[redacted]d by LMP who presents to maternity admissions reporting vaginal spotting x3-4 days.  She denies pain today but reports some sharp lower abdominal pain a few days ago.  She reports some white vaginal discharge but denies vaginal itching/burning, urinary symptoms, h/a, dizziness, n/v, or fever/chills.     Past Medical History  Diagnosis Date  . Genital herpes    Past Surgical History  Procedure Laterality Date  . No past surgeries     History   Social History  . Marital Status: Single    Spouse Name: N/A    Number of Children: 1  . Years of Education: N/A   Occupational History  .  Unemployed   Social History Main Topics  . Smoking status: Never Smoker   . Smokeless tobacco: Never Used  . Alcohol Use: No  . Drug Use: No  . Sexual Activity: Not on file   Other Topics Concern  . Not on file   Social History Narrative  . No narrative on file   No current facility-administered medications on file prior to encounter.   No current outpatient prescriptions on file prior to encounter.   No Known Allergies  ROS: Pertinent items in HPI  OBJECTIVE Blood pressure 128/86, pulse 114, temperature 98.8 F (37.1 C), temperature source Oral, resp. rate 16, height 5\' 2"  (1.575 m), weight 51.619 kg (113 lb 12.8 oz), last menstrual period 06/02/2013, SpO2 100.00%. GENERAL: Well-developed, well-nourished female in no acute distress.  HEENT: Normocephalic HEART: normal rate RESP: normal effort ABDOMEN: Soft, non-tender EXTREMITIES: Nontender, no edema NEURO: Alert and oriented Pelvic exam: Cervix pink, visually closed, without lesion, mild friability of cervix with cotton swab, scant white creamy discharge, no frank blood noted, vaginal walls and external genitalia normal Bimanual exam: Cervix 0/long/high, firm, anterior, neg CMT, uterus nontender, ~8 week  size, adnexa without tenderness, enlargement, or mass  Unable to obtain FHT by doppler  LAB RESULTS Results for orders placed during the hospital encounter of 08/15/13 (from the past 24 hour(s))  URINALYSIS, ROUTINE W REFLEX MICROSCOPIC     Status: Abnormal   Collection Time    08/15/13  6:50 PM      Result Value Range   Color, Urine YELLOW  YELLOW   APPearance CLEAR  CLEAR   Specific Gravity, Urine 1.020  1.005 - 1.030   pH 6.0  5.0 - 8.0   Glucose, UA NEGATIVE  NEGATIVE mg/dL   Hgb urine dipstick SMALL (*) NEGATIVE   Bilirubin Urine NEGATIVE  NEGATIVE   Ketones, ur NEGATIVE  NEGATIVE mg/dL   Protein, ur NEGATIVE  NEGATIVE mg/dL   Urobilinogen, UA 0.2  0.0 - 1.0 mg/dL   Nitrite NEGATIVE  NEGATIVE   Leukocytes, UA NEGATIVE  NEGATIVE  URINE MICROSCOPIC-ADD ON     Status: Abnormal   Collection Time    08/15/13  6:50 PM      Result Value Range   Squamous Epithelial / LPF FEW (*) RARE   WBC, UA 0-2  <3 WBC/hpf   RBC / HPF 0-2  <3 RBC/hpf   Bacteria, UA RARE  RARE  POCT PREGNANCY, URINE     Status: Abnormal   Collection Time    08/15/13  6:55 PM      Result Value Range   Preg Test, Ur POSITIVE (*) NEGATIVE  CBC     Status: Abnormal  Collection Time    08/15/13  7:09 PM      Result Value Range   WBC 3.4 (*) 4.0 - 10.5 K/uL   RBC 4.42  3.87 - 5.11 MIL/uL   Hemoglobin 12.3  12.0 - 15.0 g/dL   HCT 47.8  29.5 - 62.1 %   MCV 85.3  78.0 - 100.0 fL   MCH 27.8  26.0 - 34.0 pg   MCHC 32.6  30.0 - 36.0 g/dL   RDW 30.8  65.7 - 84.6 %   Platelets 177  150 - 400 K/uL    IMAGING US Ob Comp Less 14 Wks  08/15/2013   EXAM: OBSTETRIC <14 WK ULTRASOUND  TECHNIQUE: Transabdominal ultrasound was performed for evaluation of the gestation as well as the maternal uterus and adnexal regions.  COMPARISON:  None.  FINDINGS: Intrauterine gestational sac: Present but appears abnormally elongated  Yolk sac:  Not identified  Embryo:  Present  Cardiac Activity: None identified  CRL:   22  mm   8 w 6  d                  [place] EDC: 03/21/2014  Maternal uterus/adnexae: No subchorionic hemorrhage or free fluid. Ovaries appear normal.  IMPRESSION: Although an intrauterine gestation is identified, the findings are concerning for fetal demise.   Electronically Signed   By: Esperanza Heir M.D.   On: 08/15/2013 20:44   Discussed U/S with sonographer and measurements without cardiac activity meet criteria for definitive diagnosis of SAB.   ASSESSMENT 1. Spontaneous miscarriage   IUP @ [redacted]w[redacted]d by U/S today without cardiac activity  PLAN Discussed SAB with pt, options for expectant management, Cytotec Rx, Cytotec in MAU, or D&C.  Pt prefers Cytotec in MAU.   Discussed expected results with Cytotec, reasons to return to MAU, including bleeding >1pad/hour for more than 1 hour.  Pt has support/help at home for the next few days. Cytotec 800 mcg placed vaginally Ibuprofen 600 mg PO x1 dose in MAU Discharge home Ibuprofen 600 mg Q 6 hours PRN Percocet 5/325 x2 tabs Q 6 hours PRN x10 tabs Phenergan 12.5-25 mg Q 6 hours PRN F/U in clinic in 2 weeks, message sent for clinic to call pt Return to MAU as needed     Medication List    Notice   You have not been prescribed any medications.       Sharen Counter Certified Nurse-Midwife 08/15/2013  7:22 PM

## 2013-08-16 LAB — GC/CHLAMYDIA PROBE AMP
CT Probe RNA: NEGATIVE
GC Probe RNA: NEGATIVE

## 2013-09-02 ENCOUNTER — Other Ambulatory Visit: Payer: Medicaid Other

## 2013-09-02 DIAGNOSIS — O039 Complete or unspecified spontaneous abortion without complication: Secondary | ICD-10-CM

## 2013-12-29 ENCOUNTER — Emergency Department (HOSPITAL_COMMUNITY): Payer: Medicaid Other

## 2013-12-29 ENCOUNTER — Emergency Department (HOSPITAL_COMMUNITY)
Admission: EM | Admit: 2013-12-29 | Discharge: 2013-12-30 | Disposition: A | Discharge status: Home / Self Care | Payer: Medicaid Other | Attending: Emergency Medicine | Admitting: Emergency Medicine

## 2013-12-29 ENCOUNTER — Encounter (HOSPITAL_COMMUNITY): Payer: Self-pay | Admitting: Emergency Medicine

## 2013-12-29 ENCOUNTER — Other Ambulatory Visit: Payer: Self-pay

## 2013-12-29 DIAGNOSIS — R059 Cough, unspecified: Secondary | ICD-10-CM | POA: Insufficient documentation

## 2013-12-29 DIAGNOSIS — R0789 Other chest pain: Secondary | ICD-10-CM

## 2013-12-29 DIAGNOSIS — Z3202 Encounter for pregnancy test, result negative: Secondary | ICD-10-CM | POA: Insufficient documentation

## 2013-12-29 DIAGNOSIS — R05 Cough: Secondary | ICD-10-CM | POA: Insufficient documentation

## 2013-12-29 DIAGNOSIS — M549 Dorsalgia, unspecified: Secondary | ICD-10-CM | POA: Insufficient documentation

## 2013-12-29 DIAGNOSIS — Z8619 Personal history of other infectious and parasitic diseases: Secondary | ICD-10-CM | POA: Insufficient documentation

## 2013-12-29 DIAGNOSIS — R071 Chest pain on breathing: Secondary | ICD-10-CM | POA: Insufficient documentation

## 2013-12-29 LAB — COMPREHENSIVE METABOLIC PANEL
ALK PHOS: 52 U/L (ref 39–117)
ALT: 12 U/L (ref 0–35)
AST: 17 U/L (ref 0–37)
Albumin: 4.1 g/dL (ref 3.5–5.2)
BUN: 13 mg/dL (ref 6–23)
CO2: 23 mEq/L (ref 19–32)
Calcium: 9.2 mg/dL (ref 8.4–10.5)
Chloride: 102 mEq/L (ref 96–112)
Creatinine, Ser: 0.63 mg/dL (ref 0.50–1.10)
GFR calc non Af Amer: >90 mL/min (ref >90)
GLUCOSE: 84 mg/dL (ref 70–99)
POTASSIUM: 3 meq/L — AB (ref 3.7–5.3)
SODIUM: 139 meq/L (ref 137–147)
TOTAL PROTEIN: 8.2 g/dL (ref 6.0–8.3)
Total Bilirubin: 0.6 mg/dL (ref 0.3–1.2)

## 2013-12-29 LAB — URINALYSIS, ROUTINE W REFLEX MICROSCOPIC
BILIRUBIN URINE: NEGATIVE
Glucose, UA: NEGATIVE mg/dL
HGB URINE DIPSTICK: NEGATIVE
Ketones, ur: NEGATIVE mg/dL
Leukocytes, UA: NEGATIVE
NITRITE: NEGATIVE
PH: 6 (ref 5.0–8.0)
Protein, ur: NEGATIVE mg/dL
SPECIFIC GRAVITY, URINE: 1.029 (ref 1.005–1.030)
Urobilinogen, UA: 0.2 mg/dL (ref 0.0–1.0)

## 2013-12-29 LAB — CBC
HEMATOCRIT: 39 % (ref 36.0–46.0)
HEMOGLOBIN: 13 g/dL (ref 12.0–15.0)
MCH: 28.1 pg (ref 26.0–34.0)
MCHC: 33.3 g/dL (ref 30.0–36.0)
MCV: 84.2 fL (ref 78.0–100.0)
Platelets: 172 10*3/uL (ref 150–400)
RBC: 4.63 MIL/uL (ref 3.87–5.11)
RDW: 14.4 % (ref 11.5–15.5)
WBC: 3.1 10*3/uL — ABNORMAL LOW (ref 4.0–10.5)

## 2013-12-29 LAB — POC URINE PREG, ED: PREG TEST UR: NEGATIVE

## 2013-12-29 MED ORDER — POTASSIUM CHLORIDE CRYS ER 20 MEQ PO TBCR
40.0000 meq | EXTENDED_RELEASE_TABLET | Freq: Once | ORAL | Status: AC
  Administered 2013-12-29: 40 meq via ORAL
  Filled 2013-12-29: qty 2

## 2013-12-29 NOTE — ED Notes (Signed)
Per pt sts sharp chest pain x 3 days. sts worse with moving and breathing. Denies SOB. sts hurts when she breathes in. Denies cough. Denies injury

## 2013-12-29 NOTE — ED Provider Notes (Signed)
CSN: [id number]     Arrival date & time 12/29/13  1859 History   First MD Initiated Contact with Patient 12/29/13 2212     Chief Complaint  Patient presents with  . Chest Pain     (Consider location/radiation/quality/duration/timing/severity/associated sxs/prior Treatment) HPI Comments: Patient presents to the ER for evaluation of pain in the chest and back. It is sharp and occurs intermittently. It is positional, worse when she lies down. She notices it with bending over and also taking deep breaths. There is no shortness of breath. She denies injury. She has had some cough associated with symptoms, all symptoms began yesterday.  Patient is a 23 y.o. female presenting with chest pain.  Chest Pain   Past Medical History  Diagnosis Date  . Genital herpes    Past Surgical History  Procedure Laterality Date  . No past surgeries     History reviewed. No pertinent family history. History  Substance Use Topics  . Smoking status: Never Smoker   . Smokeless tobacco: Never Used  . Alcohol Use: No   OB History   Grav Para Term Preterm Abortions TAB SAB Ect Mult Living   1              Review of Systems  Cardiovascular: Positive for chest pain.  All other systems reviewed and are negative.      Allergies  Review of patient's allergies indicates no known allergies.  Home Medications   Current Outpatient Rx  Name  Route  Sig  Dispense  Refill  . ibuprofen (ADVIL,MOTRIN) 800 MG tablet   Oral   Take 1 tablet (800 mg total) by mouth 3 (three) times daily.   21 tablet   0   . traMADol (ULTRAM) 50 MG tablet   Oral   Take 1 tablet (50 mg total) by mouth every 6 (six) hours as needed.   15 tablet   0    BP 105/72  Pulse 72  Temp(Src) 98 F (36.7 C) (Oral)  Resp 18  SpO2 98%  LMP 12/14/2013  Breastfeeding? Unknown Physical Exam  Constitutional: She is oriented to person, place, and time. She appears well-developed and well-nourished. No distress.  HENT:  Head:  Normocephalic and atraumatic.  Right Ear: Hearing normal.  Left Ear: Hearing normal.  Nose: Nose normal.  Mouth/Throat: Oropharynx is clear and moist and mucous membranes are normal.  Eyes: Conjunctivae and EOM are normal. Pupils are equal, round, and reactive to light.  Neck: Normal range of motion. Neck supple.  Cardiovascular: Regular rhythm, S1 normal and S2 normal.  Exam reveals no gallop and no friction rub.   No murmur heard. Pulmonary/Chest: Effort normal and breath sounds normal. No respiratory distress. She exhibits no tenderness.  Abdominal: Soft. Normal appearance and bowel sounds are normal. There is no hepatosplenomegaly. There is no tenderness. There is no rebound, no guarding, no tenderness at McBurney's point and negative Murphy's sign. No hernia.  Musculoskeletal: Normal range of motion.  Neurological: She is alert and oriented to person, place, and time. She has normal strength. No cranial nerve deficit or sensory deficit. Coordination normal. GCS eye subscore is 4. GCS verbal subscore is 5. GCS motor subscore is 6.  Skin: Skin is warm, dry and intact. No rash noted. No cyanosis.  Psychiatric: She has a normal mood and affect. Her speech is normal and behavior is normal. Thought content normal.    ED Course  Procedures (including critical care time) Labs Review Labs Reviewed  COMPREHENSIVE  METABOLIC PANEL - Abnormal; Notable for the following:    Potassium 3.0 (*)    All other components within normal limits  CBC - Abnormal; Notable for the following:    WBC 3.1 (*)    All other components within normal limits  URINALYSIS, ROUTINE W REFLEX MICROSCOPIC  POC URINE PREG, ED   Imaging Review Dg Chest 2 View  12/29/2013   CLINICAL DATA:  Chest pain.  EXAM: CHEST  2 VIEW  COMPARISON:  10/25/2011  FINDINGS: The heart size and mediastinal contours are within normal limits. Both lungs are clear. The visualized skeletal structures are unremarkable.  IMPRESSION: Normal chest.    Electronically Signed   By: Geanie CooleyJim  Maxwell M.D.   On: 12/29/2013 20:16   Ct Angio Chest Pe W/cm &/or Wo Cm  12/30/2013   CLINICAL DATA:  Three day history of sharp chest pain, pain with inspiration  EXAM: CT ANGIOGRAPHY CHEST WITH CONTRAST  TECHNIQUE: Multidetector CT imaging of the chest was performed using the standard protocol during bolus administration of intravenous contrast. Multiplanar CT image reconstructions and MIPs were obtained to evaluate the vascular anatomy.  CONTRAST:  100mL OMNIPAQUE IOHEXOL 350 MG/ML SOLN  COMPARISON:  Chest x-ray 12/29/2013 ; prior CT chest 10/25/2011  FINDINGS: Mediastinum: Unremarkable CT appearance of the thyroid gland. No suspicious mediastinal or hilar adenopathy. Triangular soft tissue in the anterior mediastinum consistent with residual thymus. The thoracic esophagus is unremarkable.  Heart/Vascular: Adequate opacification of the pulmonary arteries to the segmental level. No central filling defects to suggest acute pulmonary embolus. Main pulmonary artery is within normal limits for size. The heart is within normal limits for size. No pericardial effusion.  Lungs/Pleura: No pleural effusion.The lungs are clear.  Bones/Soft Tissues: No acute fracture or aggressive appearing lytic or blastic osseous lesion.  Upper Abdomen: Unremarkable imaged upper abdomen.  Review of the MIP images confirms the above findings.  IMPRESSION: Negative for acute PE, pneumonia or other acute cardiopulmonary process.   Electronically Signed   By: Malachy MoanHeath  McCullough M.D.   On: 12/30/2013 01:07     EKG Interpretation None      Date: 12/29/2013  Rate: 102  Rhythm: normal sinus rhythm and sinus tachycardia  QRS Axis: right  Intervals: normal  ST/T Wave abnormalities: normal  Conduction Disutrbances:none  Narrative Interpretation:   Old EKG Reviewed: none available    MDM   Final diagnoses:  Chest wall pain   Patient with chest and back pain. No tenderness to palpation  anteriorly, the pain is reproducible with movement of the torso. Vital signs are stable. No concern for PE based on clinical presentation. Wells/PERC negative. Chest x-ray does not show any lung pathology. Normal mediastinum. Patient reassured, treated with rest and analgesia.    Gilda Creasehristopher J. Davisha Linthicum, MD 12/31/13 [phone]

## 2013-12-30 ENCOUNTER — Encounter (HOSPITAL_COMMUNITY): Payer: Self-pay | Admitting: Radiology

## 2013-12-30 ENCOUNTER — Emergency Department (HOSPITAL_COMMUNITY): Payer: Medicaid Other

## 2013-12-30 MED ORDER — IBUPROFEN 800 MG PO TABS: 800.0000 mg | ORAL_TABLET | Freq: Three times a day (TID) | ORAL | Status: DC

## 2013-12-30 MED ORDER — IOHEXOL 350 MG/ML SOLN
100.0000 mL | Freq: Once | INTRAVENOUS | Status: AC | PRN
  Administered 2013-12-30: 100 mL via INTRAVENOUS

## 2013-12-30 MED ORDER — TRAMADOL HCL 50 MG PO TABS: 50.0000 mg | ORAL_TABLET | Freq: Four times a day (QID) | ORAL | Status: DC | PRN

## 2013-12-30 NOTE — Discharge Instructions (Signed)

## 2014-01-21 ENCOUNTER — Inpatient Hospital Stay (HOSPITAL_COMMUNITY)
Admission: AD | Admit: 2014-01-21 | Discharge: 2014-01-21 | Disposition: A | Discharge status: Home / Self Care | Payer: Medicaid Other | Source: Ambulatory Visit | Attending: Obstetrics | Admitting: Obstetrics

## 2014-01-21 ENCOUNTER — Encounter (HOSPITAL_COMMUNITY): Payer: Self-pay | Admitting: Family

## 2014-01-21 DIAGNOSIS — Z87891 Personal history of nicotine dependence: Secondary | ICD-10-CM | POA: Insufficient documentation

## 2014-01-21 DIAGNOSIS — B9689 Other specified bacterial agents as the cause of diseases classified elsewhere: Secondary | ICD-10-CM | POA: Insufficient documentation

## 2014-01-21 DIAGNOSIS — B009 Herpesviral infection, unspecified: Secondary | ICD-10-CM | POA: Insufficient documentation

## 2014-01-21 DIAGNOSIS — A499 Bacterial infection, unspecified: Secondary | ICD-10-CM

## 2014-01-21 DIAGNOSIS — N760 Acute vaginitis: Secondary | ICD-10-CM

## 2014-01-21 LAB — URINALYSIS, ROUTINE W REFLEX MICROSCOPIC
Bilirubin Urine: NEGATIVE
GLUCOSE, UA: NEGATIVE mg/dL
Hgb urine dipstick: NEGATIVE
Ketones, ur: NEGATIVE mg/dL
LEUKOCYTES UA: NEGATIVE
NITRITE: NEGATIVE
PH: 6.5 (ref 5.0–8.0)
Protein, ur: NEGATIVE mg/dL
Specific Gravity, Urine: 1.025 (ref 1.005–1.030)
Urobilinogen, UA: 0.2 mg/dL (ref 0.0–1.0)

## 2014-01-21 LAB — POCT PREGNANCY, URINE: Preg Test, Ur: NEGATIVE

## 2014-01-21 LAB — WET PREP, GENITAL
Trich, Wet Prep: NONE SEEN
Yeast Wet Prep HPF POC: NONE SEEN

## 2014-01-21 MED ORDER — METRONIDAZOLE 500 MG PO TABS: 500.0000 mg | ORAL_TABLET | Freq: Two times a day (BID) | ORAL | Status: DC

## 2014-01-21 NOTE — MAU Note (Signed)
23 yo, LMP 01/08/14, presents to MAU with c/o white/yellow malodorous vaginal discharge x 3 days. Denies pain, itching, VB.

## 2014-01-21 NOTE — Discharge Instructions (Signed)

## 2014-01-21 NOTE — MAU Provider Note (Signed)
History     CSN: [id number]  Arrival date and time: 01/21/14 1652   First Provider Initiated Contact with Patient 01/21/14 1749      Chief Complaint  Patient presents with  . Vaginal Discharge   HPI  Stacy Kelley is a 23 y.o. G1P1001. LMP 4/12, UPT neg. She presents with c/o whtte/yellow vaginal discharge with strong ammonia odor x 2-3 d. No itching or burning. No bleeding or spotting. Same partner x 5 yr.   OB History   Grav Para Term Preterm Abortions TAB SAB Ect Mult Living   1 1 1       1       Past Medical History  Diagnosis Date  . Genital herpes     Past Surgical History  Procedure Laterality Date  . Cesarean section      History reviewed. No pertinent family history.  History  Substance Use Topics  . Smoking status: Current Some Day Smoker  . Smokeless tobacco: Never Used  . Alcohol Use: No    Allergies: No Known Allergies  Prescriptions prior to admission  Medication Sig Dispense Refill  . ibuprofen (ADVIL,MOTRIN) 800 MG tablet Take 1 tablet (800 mg total) by mouth 3 (three) times daily.  21 tablet  0    Review of Systems  Constitutional: Negative for fever and chills.  Gastrointestinal: Negative for heartburn, diarrhea and constipation.  Genitourinary: Negative for dysuria, urgency and frequency.       + discharge with odor - itching or bleeding   Physical Exam   Blood pressure 130/91, pulse 81, temperature 98.4 F (36.9 C), temperature source Oral, resp. rate 15, last menstrual period 01/08/2014, unknown if currently breastfeeding.  Physical Exam  Nursing note and vitals reviewed. Constitutional: She is oriented to person, place, and time. She appears well-developed and well-nourished.  GI: Soft. There is no tenderness.  Genitourinary:  Pelvic exam- Ext gen- nl anatomy, skin intact Vagina- mod amt creamy yellow discharge Cx- parous Ut- non tender, nl size Adn- non tender, no masses apalp  Musculoskeletal: Normal range of motion.   Neurological: She is alert and oriented to person, place, and time.  Skin: Skin is warm and dry.  Psychiatric: She has a normal mood and affect. Her behavior is normal.    MAU Course  Procedures  MDM Results for orders placed during the hospital encounter of 01/21/14 (from the past 24 hour(s))  URINALYSIS, ROUTINE W REFLEX MICROSCOPIC     Status: None   Collection Time    01/21/14  5:28 PM      Result Value Ref Range   Color, Urine YELLOW  YELLOW   APPearance CLEAR  CLEAR   Specific Gravity, Urine 1.025  1.005 - 1.030   pH 6.5  5.0 - 8.0   Glucose, UA NEGATIVE  NEGATIVE mg/dL   Hgb urine dipstick NEGATIVE  NEGATIVE   Bilirubin Urine NEGATIVE  NEGATIVE   Ketones, ur NEGATIVE  NEGATIVE mg/dL   Protein, ur NEGATIVE  NEGATIVE mg/dL   Urobilinogen, UA 0.2  0.0 - 1.0 mg/dL   Nitrite NEGATIVE  NEGATIVE   Leukocytes, UA NEGATIVE  NEGATIVE  POCT PREGNANCY, URINE     Status: None   Collection Time    01/21/14  5:30 PM      Result Value Ref Range   Preg Test, Ur NEGATIVE  NEGATIVE  WET PREP, GENITAL     Status: Abnormal   Collection Time    01/21/14  5:55 PM  Result Value Ref Range   Yeast Wet Prep HPF POC NONE SEEN  NONE SEEN   Trich, Wet Prep NONE SEEN  NONE SEEN   Clue Cells Wet Prep HPF POC MANY (*) NONE SEEN   WBC, Wet Prep HPF POC FEW (*) NONE SEEN     Assessment and Plan  BV     Flagyl 500 bid x 7 F/U in office as needed  Rodell PernaKathy M Owais Pruett 01/21/2014, 5:57 PM

## 2014-01-24 LAB — GC/CHLAMYDIA PROBE AMP
CT Probe RNA: NEGATIVE
GC Probe RNA: NEGATIVE

## 2014-03-18 ENCOUNTER — Encounter (HOSPITAL_COMMUNITY): Payer: Self-pay

## 2014-03-18 ENCOUNTER — Inpatient Hospital Stay (HOSPITAL_COMMUNITY)
Admission: AD | Admit: 2014-03-18 | Discharge: 2014-03-18 | Disposition: A | Discharge status: Home / Self Care | Payer: Medicaid Other | Source: Ambulatory Visit | Attending: Obstetrics & Gynecology | Admitting: Obstetrics & Gynecology

## 2014-03-18 DIAGNOSIS — O9989 Other specified diseases and conditions complicating pregnancy, childbirth and the puerperium: Principal | ICD-10-CM

## 2014-03-18 DIAGNOSIS — Z3491 Encounter for supervision of normal pregnancy, unspecified, first trimester: Secondary | ICD-10-CM

## 2014-03-18 DIAGNOSIS — O99330 Smoking (tobacco) complicating pregnancy, unspecified trimester: Secondary | ICD-10-CM | POA: Insufficient documentation

## 2014-03-18 DIAGNOSIS — R300 Dysuria: Secondary | ICD-10-CM | POA: Insufficient documentation

## 2014-03-18 DIAGNOSIS — O99891 Other specified diseases and conditions complicating pregnancy: Secondary | ICD-10-CM | POA: Insufficient documentation

## 2014-03-18 DIAGNOSIS — R399 Unspecified symptoms and signs involving the genitourinary system: Secondary | ICD-10-CM

## 2014-03-18 LAB — URINALYSIS, ROUTINE W REFLEX MICROSCOPIC
Bilirubin Urine: NEGATIVE
Glucose, UA: NEGATIVE mg/dL
Hgb urine dipstick: NEGATIVE
Ketones, ur: NEGATIVE mg/dL
LEUKOCYTES UA: NEGATIVE
NITRITE: NEGATIVE
PH: 6 (ref 5.0–8.0)
Protein, ur: NEGATIVE mg/dL
SPECIFIC GRAVITY, URINE: 1.02 (ref 1.005–1.030)
Urobilinogen, UA: 0.2 mg/dL (ref 0.0–1.0)

## 2014-03-18 LAB — WET PREP, GENITAL
Clue Cells Wet Prep HPF POC: NONE SEEN
Trich, Wet Prep: NONE SEEN
YEAST WET PREP: NONE SEEN

## 2014-03-18 LAB — POCT PREGNANCY, URINE: Preg Test, Ur: POSITIVE — AB

## 2014-03-18 NOTE — MAU Note (Signed)
Patient states she has been having pain with urination today. Denies bleeding, discharge, nausea or vomiting.

## 2014-03-18 NOTE — MAU Provider Note (Signed)
Attestation of Attending Supervision of Advanced Practitioner (CNM/NP): Evaluation and management procedures were performed by the Advanced Practitioner under my supervision and collaboration.  I have reviewed the Advanced Practitioner's note and chart, and I agree with the management and plan.  CONSTANT,PEGGY 03/18/2014 9:56 PM   

## 2014-03-18 NOTE — Discharge Instructions (Signed)
Your urine and wet prep tonight are normal. We are sending your urine for culture since your are having the symptoms with urination. Follow up with DR. Tamela OddiJackson-Moore to start your prenatal care. Return here as needed for problems.

## 2014-03-18 NOTE — MAU Note (Signed)
Pt states burning with voiding however no bleeding or abnormal vaginal discharge.

## 2014-03-18 NOTE — MAU Provider Note (Signed)
CSN: [id number]     Arrival date & time 03/18/14  1651 History   None    Chief Complaint  Patient presents with  . Dysuria  . Possible Pregnancy     (Consider location/radiation/quality/duration/timing/severity/associated sxs/prior Treatment) Patient is a 23 y.o. female presenting with dysuria and pregnancy problem. The history is provided by the patient.  Dysuria  This is a new problem. The current episode started 3 to 5 hours ago. The problem occurs every urination. The problem has been gradually worsening. The quality of the pain is described as aching. The pain is at a severity of 5/10. The pain is moderate. There has been no fever. She is sexually active. Associated symptoms include possible pregnancy. Pertinent negatives include no nausea, no vomiting, no urgency and no flank pain. She has tried nothing for the symptoms.  Possible Pregnancy Pertinent negatives include no nausea or vomiting.   Mason Jimndrea M Delahoz is a 23 y.o. G2P1001 @ 70104w6d gestation who presents to the MAU with painful urination. She states that for the past 4 or 5 hours every time she urinates she feels an aching feeling in the vaginal area. She denies vaginal bleeding or discharge. She denies abdominal pain or any other problems.   Past Medical History  Diagnosis Date  . Genital herpes    Past Surgical History  Procedure Laterality Date  . Cesarean section     History reviewed. No pertinent family history. History  Substance Use Topics  . Smoking status: Current Some Day Smoker  . Smokeless tobacco: Never Used  . Alcohol Use: No   OB History   Grav Para Term Preterm Abortions TAB SAB Ect Mult Living   2 1 1       1      Review of Systems  Gastrointestinal: Negative for nausea and vomiting.  Genitourinary: Negative for dysuria, urgency and flank pain.       Pressure and aching in vaginal area with urination. Early pregnant  All other systems negative    Allergies  Review of patient's allergies  indicates no known allergies.  Home Medications   Prior to Admission medications   Not on File   BP 102/58  Pulse 78  Temp(Src) 98.4 F (36.9 C) (Oral)  Resp 18  Ht 5\' 2"  (1.575 m)  Wt 108 lb (48.988 kg)  BMI 19.75 kg/m2  SpO2 98%  LMP 01/29/2014  Breastfeeding? No Physical Exam  Nursing note and vitals reviewed. Constitutional: She is oriented to person, place, and time. She appears well-developed and well-nourished.  HENT:  Head: Normocephalic.  Eyes: Conjunctivae and EOM are normal.  Neck: Neck supple.  Cardiovascular: Normal rate.   Pulmonary/Chest: Effort normal.  Abdominal: Soft. There is no tenderness.  Genitourinary:  External genitalia without lesions, cervix long, closed, no CMT, no adnexal tenderness or mass palpated. Uterus with minimal enlargement.   Musculoskeletal: Normal range of motion.  Neurological: She is alert and oriented to person, place, and time. No cranial nerve deficit.  Skin: Skin is warm and dry.  Psychiatric: She has a normal mood and affect. Her behavior is normal.    ED Course  Procedures (including critical care time) Labs Review Results for orders placed during the hospital encounter of 03/18/14 (from the past 24 hour(s))  URINALYSIS, ROUTINE W REFLEX MICROSCOPIC     Status: None   Collection Time    03/18/14  5:10 PM      Result Value Ref Range   Color, Urine YELLOW  YELLOW  APPearance CLEAR  CLEAR   Specific Gravity, Urine 1.020  1.005 - 1.030   pH 6.0  5.0 - 8.0   Glucose, UA NEGATIVE  NEGATIVE mg/dL   Hgb urine dipstick NEGATIVE  NEGATIVE   Bilirubin Urine NEGATIVE  NEGATIVE   Ketones, ur NEGATIVE  NEGATIVE mg/dL   Protein, ur NEGATIVE  NEGATIVE mg/dL   Urobilinogen, UA 0.2  0.0 - 1.0 mg/dL   Nitrite NEGATIVE  NEGATIVE   Leukocytes, UA NEGATIVE  NEGATIVE  POCT PREGNANCY, URINE     Status: Abnormal   Collection Time    03/18/14  5:27 PM      Result Value Ref Range   Preg Test, Ur POSITIVE (*) NEGATIVE  WET PREP,  GENITAL     Status: Abnormal   Collection Time    03/18/14  6:17 PM      Result Value Ref Range   Yeast Wet Prep HPF POC NONE SEEN  NONE SEEN   Trich, Wet Prep NONE SEEN  NONE SEEN   Clue Cells Wet Prep HPF POC NONE SEEN  NONE SEEN   WBC, Wet Prep HPF POC MODERATE (*) NONE SEEN    23 y.o. female with early pregnancy and pressure with urination. Stable for discharge without abdominal pain, urine normal, wet prep normal. She will start her prenatal care with Dr. Tamela OddiJackson-Moore. She will return here for any problems. Urine sent for culture. STD screening. Discussed with the patient and all questioned fully answered.

## 2014-03-19 LAB — HIV ANTIBODY (ROUTINE TESTING W REFLEX): HIV 1&2 Ab, 4th Generation: NONREACTIVE

## 2014-03-20 LAB — CULTURE, OB URINE
COLONY COUNT: NO GROWTH
CULTURE: NO GROWTH

## 2014-03-20 LAB — GC/CHLAMYDIA PROBE AMP
CT PROBE, AMP APTIMA: NEGATIVE
GC PROBE AMP APTIMA: NEGATIVE

## 2014-04-18 ENCOUNTER — Inpatient Hospital Stay (HOSPITAL_COMMUNITY)
Admission: AD | Admit: 2014-04-18 | Discharge: 2014-04-18 | Disposition: A | Discharge status: Home / Self Care | Payer: Medicaid Other | Source: Ambulatory Visit | Attending: Obstetrics & Gynecology | Admitting: Obstetrics & Gynecology

## 2014-04-18 ENCOUNTER — Encounter (HOSPITAL_COMMUNITY): Payer: Self-pay | Admitting: *Deleted

## 2014-04-18 ENCOUNTER — Inpatient Hospital Stay (HOSPITAL_COMMUNITY): Payer: Medicaid Other

## 2014-04-18 DIAGNOSIS — F17200 Nicotine dependence, unspecified, uncomplicated: Secondary | ICD-10-CM | POA: Insufficient documentation

## 2014-04-18 DIAGNOSIS — O021 Missed abortion: Secondary | ICD-10-CM | POA: Insufficient documentation

## 2014-04-18 DIAGNOSIS — A6000 Herpesviral infection of urogenital system, unspecified: Secondary | ICD-10-CM | POA: Diagnosis not present

## 2014-04-18 DIAGNOSIS — O209 Hemorrhage in early pregnancy, unspecified: Secondary | ICD-10-CM | POA: Diagnosis present

## 2014-04-18 HISTORY — DX: Gonococcal infection, unspecified: A54.9

## 2014-04-18 LAB — CBC
HEMATOCRIT: 36.2 % (ref 36.0–46.0)
Hemoglobin: 12.3 g/dL (ref 12.0–15.0)
MCH: 28.5 pg (ref 26.0–34.0)
MCHC: 34 g/dL (ref 30.0–36.0)
MCV: 83.8 fL (ref 78.0–100.0)
PLATELETS: 168 10*3/uL (ref 150–400)
RBC: 4.32 MIL/uL (ref 3.87–5.11)
RDW: 13.5 % (ref 11.5–15.5)
WBC: 2.7 10*3/uL — ABNORMAL LOW (ref 4.0–10.5)

## 2014-04-18 LAB — HCG, QUANTITATIVE, PREGNANCY: HCG, BETA CHAIN, QUANT, S: 8408 m[IU]/mL — AB (ref <5)

## 2014-04-18 MED ORDER — MISOPROSTOL 200 MCG PO TABS
800.0000 ug | ORAL_TABLET | Freq: Once | ORAL | Status: AC
  Administered 2014-04-18: 800 ug via ORAL
  Filled 2014-04-18: qty 4

## 2014-04-18 MED ORDER — IBUPROFEN 800 MG PO TABS: 800.0000 mg | ORAL_TABLET | Freq: Three times a day (TID) | ORAL | Status: DC

## 2014-04-18 MED ORDER — KETOROLAC TROMETHAMINE 60 MG/2ML IM SOLN
60.0000 mg | Freq: Once | INTRAMUSCULAR | Status: AC
  Administered 2014-04-18: 60 mg via INTRAMUSCULAR
  Filled 2014-04-18: qty 2

## 2014-04-18 MED ORDER — PROMETHAZINE HCL 25 MG PO TABS: 25.0000 mg | ORAL_TABLET | Freq: Four times a day (QID) | ORAL | Status: DC | PRN

## 2014-04-18 NOTE — MAU Note (Signed)
Pt signed consent for to be given cytotec here

## 2014-04-18 NOTE — MAU Provider Note (Signed)
History     CSN: [id number]  Arrival date and time: 04/18/14 1746   First Provider Initiated Contact with Patient 04/18/14 1823      Chief Complaint  Patient presents with  . Vaginal Bleeding   HPI  Pt is G2P1001 @[redacted]w[redacted]d  pregnant and presents with bleeding in pregnancy that started this morning. Pt noticed dark brown spotting when went to the bathroom which has become bright red.  Pt denies UTI symptoms, cramping or abnormal vaginal discharge.  Pt denies passing tissue or clots.  Pt was seen on 03/18/2014 for confirmation of pregnancy and had wet prep and GC/chlamydia cultures at that time.  Past Medical History  Diagnosis Date  . Genital herpes     Past Surgical History  Procedure Laterality Date  . Cesarean section      No family history on file.  History  Substance Use Topics  . Smoking status: Current Some Day Smoker  . Smokeless tobacco: Never Used  . Alcohol Use: No    Allergies: No Known Allergies  No prescriptions prior to admission    Review of Systems  Constitutional: Negative for fever and chills.  Gastrointestinal: Negative for nausea, vomiting, abdominal pain, diarrhea and constipation.  Genitourinary: Negative for dysuria.   Physical Exam   Last menstrual period 01/29/2014.  Physical Exam  Constitutional: She is oriented to person, place, and time. She appears well-developed and well-nourished. No distress.  HENT:  Head: Normocephalic.  Eyes: Pupils are equal, round, and reactive to light.  Neck: Normal range of motion. Neck supple.  Cardiovascular: Normal rate.   Respiratory: Effort normal.  GI: Soft. She exhibits no distension. There is no tenderness. There is no rebound.  Genitourinary:  Small amount of brown creamy discharge in vault; cervix closed NT; uterus 8 weeks size NT; adnexa without palpable enlargement or tenderness  Musculoskeletal: Normal range of motion.  Neurological: She is alert and oriented to person, place, and time.   Skin: Skin is warm and dry.  Psychiatric: She has a normal mood and affect.    MAU Course  Procedures [place] Ob Comp Less 14 Wks  04/18/2014   CLINICAL DATA:  Bleeding for 1 day  EXAM: OBSTETRIC <14 WK US AND TRANSVAGINAL OB US  TECHNIQUE: Both transabdominal and transvaginal ultrasound examinations were performed for complete evaluation of the gestation as well as the maternal uterus, adnexal regions, and pelvic cul-de-sac. Transvaginal technique was performed to assess early pregnancy.  COMPARISON:  None.  FINDINGS: Intrauterine gestational sac: Single gestational sac identified  Yolk sac:  No yolk sac identified  Embryo:  An embryo is present  Cardiac Activity: No cardiac activity detected  Heart Rate: 0 bpm  CRL:   20.0  mm   8 w 5 d                  US EDC: 11/23/2013  Maternal uterus/adnexae: Small subchorionic hemorrhage. Ovaries are normal. No free fluid.  IMPRESSION: Findings meet definitive criteria for failed pregnancy. This follows SRU consensus guidelines: Diagnostic Criteria for Nonviable Pregnancy Early in the First Trimester. Macy Mis Engl J Med [id number];369:1443-51.   Electronically Signed   By: Esperanza Heiraymond  Rubner M.D.   On: 04/18/2014 19:54   [place] Ob Transvaginal  04/18/2014   CLINICAL DATA:  Bleeding for 1 day  EXAM: OBSTETRIC <14 WK US AND TRANSVAGINAL OB US  TECHNIQUE: Both transabdominal and transvaginal ultrasound examinations were performed for complete evaluation of the gestation as well as the maternal uterus, adnexal regions, and  pelvic cul-de-sac. Transvaginal technique was performed to assess early pregnancy.  COMPARISON:  None.  FINDINGS: Intrauterine gestational sac: Single gestational sac identified  Yolk sac:  No yolk sac identified  Embryo:  An embryo is present  Cardiac Activity: No cardiac activity detected  Heart Rate: 0 bpm  CRL:   20.0  mm   8 w 5 d                  [place] EDC: 11/23/2013  Maternal uterus/adnexae: Small subchorionic hemorrhage. Ovaries are normal. No free fluid.   IMPRESSION: Findings meet definitive criteria for failed pregnancy. This follows SRU consensus guidelines: Diagnostic Criteria for Nonviable Pregnancy Early in the First Trimester. Macy Mis J Med [phone].   Electronically Signed   By: Esperanza Heir M.D.   On: 04/18/2014 19:54   Results for orders placed during the hospital encounter of 04/18/14 (from the past 24 hour(s))  CBC     Status: Abnormal   Collection Time    04/18/14  6:01 PM      Result Value Ref Range   WBC 2.7 (*) 4.0 - 10.5 K/uL   RBC 4.32  3.87 - 5.11 MIL/uL   Hemoglobin 12.3  12.0 - 15.0 g/dL   HCT 47.8  29.5 - 62.1 %   MCV 83.8  78.0 - 100.0 fL   MCH 28.5  26.0 - 34.0 pg   MCHC 34.0  30.0 - 36.0 g/dL   RDW 30.8  65.7 - 84.6 %   Platelets 168  150 - 400 K/uL  HCG, QUANTITATIVE, PREGNANCY     Status: Abnormal   Collection Time    04/18/14  6:01 PM      Result Value Ref Range   hCG, Beta Chain, Quant, S 8408 (*) <5 mIU/mL   Discussed with pt findings of failed pregnancy- options given for management Pt had SAB with cytotec previously Pt prefers cytotec and to be given in MAU before discharged Toradol 60mg  IM given before discharge Assessment and Plan  Missed ab- Cytotec Rx for Ibuprofen and Phenergan F/u 2 weeks in clinic- clinic to call pt for appointment-( only lab and OB appointments available in book) Return sooner if heavy bleeding or pain Stacy Kelley 04/18/2014, 6:23 PM

## 2014-04-18 NOTE — MAU Note (Signed)
Brown d/c this morning, became red. Noted when wiped.  No prior bleeding. No pain. No recent intercourse

## 2014-04-19 ENCOUNTER — Inpatient Hospital Stay (HOSPITAL_COMMUNITY)
Admission: AD | Admit: 2014-04-19 | Discharge: 2014-04-19 | Disposition: A | Discharge status: Home / Self Care | Payer: Medicaid Other | Source: Ambulatory Visit | Attending: Obstetrics & Gynecology | Admitting: Obstetrics & Gynecology

## 2014-04-19 ENCOUNTER — Inpatient Hospital Stay (HOSPITAL_COMMUNITY): Payer: Medicaid Other

## 2014-04-19 ENCOUNTER — Encounter (HOSPITAL_COMMUNITY): Payer: Self-pay | Admitting: *Deleted

## 2014-04-19 DIAGNOSIS — R404 Transient alteration of awareness: Secondary | ICD-10-CM | POA: Diagnosis not present

## 2014-04-19 DIAGNOSIS — R55 Syncope and collapse: Secondary | ICD-10-CM | POA: Diagnosis not present

## 2014-04-19 DIAGNOSIS — D649 Anemia, unspecified: Secondary | ICD-10-CM | POA: Insufficient documentation

## 2014-04-19 DIAGNOSIS — N949 Unspecified condition associated with female genital organs and menstrual cycle: Secondary | ICD-10-CM | POA: Insufficient documentation

## 2014-04-19 DIAGNOSIS — N938 Other specified abnormal uterine and vaginal bleeding: Secondary | ICD-10-CM | POA: Insufficient documentation

## 2014-04-19 DIAGNOSIS — O039 Complete or unspecified spontaneous abortion without complication: Secondary | ICD-10-CM

## 2014-04-19 DIAGNOSIS — R109 Unspecified abdominal pain: Secondary | ICD-10-CM | POA: Insufficient documentation

## 2014-04-19 DIAGNOSIS — N925 Other specified irregular menstruation: Secondary | ICD-10-CM | POA: Diagnosis not present

## 2014-04-19 LAB — SAMPLE TO BLOOD BANK

## 2014-04-19 LAB — CBC
HCT: 28.2 % — ABNORMAL LOW (ref 36.0–46.0)
HEMATOCRIT: 35.4 % — AB (ref 36.0–46.0)
HEMOGLOBIN: 11.9 g/dL — AB (ref 12.0–15.0)
Hemoglobin: 9.4 g/dL — ABNORMAL LOW (ref 12.0–15.0)
MCH: 28.3 pg (ref 26.0–34.0)
MCH: 28 pg (ref 26.0–34.0)
MCHC: 33.6 g/dL (ref 30.0–36.0)
MCHC: 33.3 g/dL (ref 30.0–36.0)
MCV: 84.3 fL (ref 78.0–100.0)
MCV: 83.9 fL (ref 78.0–100.0)
Platelets: 197 10*3/uL (ref 150–400)
Platelets: 135 10*3/uL — ABNORMAL LOW (ref 150–400)
RBC: 4.2 MIL/uL (ref 3.87–5.11)
RBC: 3.36 MIL/uL — ABNORMAL LOW (ref 3.87–5.11)
RDW: 13.7 % (ref 11.5–15.5)
RDW: 13.5 % (ref 11.5–15.5)
WBC: 5.2 10*3/uL (ref 4.0–10.5)
WBC: 6.4 10*3/uL (ref 4.0–10.5)

## 2014-04-19 MED ORDER — KETOROLAC TROMETHAMINE 30 MG/ML IJ SOLN
30.0000 mg | Freq: Once | INTRAMUSCULAR | Status: AC
  Administered 2014-04-19: 30 mg via INTRAVENOUS
  Filled 2014-04-19: qty 1

## 2014-04-19 MED ORDER — LACTATED RINGERS IV BOLUS (SEPSIS)
1000.0000 mL | Freq: Once | INTRAVENOUS | Status: AC
  Administered 2014-04-19: 1000 mL via INTRAVENOUS

## 2014-04-19 MED ORDER — LACTATED RINGERS IV SOLN
INTRAVENOUS | Status: DC
  Administered 2014-04-19: 15:00:00 via INTRAVENOUS

## 2014-04-19 NOTE — MAU Note (Signed)
Pt. having scant bleeding.

## 2014-04-19 NOTE — MAU Note (Signed)
Pt. Up to BR to void, tolerated well.

## 2014-04-19 NOTE — MAU Provider Note (Signed)
CSN: [id number]     Arrival date & time 04/19/14  1011 History   None    Chief Complaint  Patient presents with  . Abdominal Pain  . Vaginal Bleeding  . Loss of Consciousness     (Consider location/radiation/quality/duration/timing/severity/associated sxs/prior Treatment) Patient is a 23 y.o. female presenting with abdominal pain, vaginal bleeding, and syncope.  Abdominal Pain The primary symptoms of the illness include abdominal pain and vaginal bleeding.  Vaginal Bleeding Associated symptoms include abdominal pain.  Loss of Consciousness Associated symptoms include abdominal pain.   Pt presents with heavy vaginal bleeding and cramping after given cytotec last night in MAU for failed pregnancy measuring [redacted]w[redacted]d. Pt started having heavy bleeding saturating clothes this morning and then passed out on way to the bathroom.  Pt states she passed a large clot?sac last night. Pt feels dizzy and light headed.   Pt has not taken anything for pain today.  Pt was given Toradol 60mg  IM last night before she left MAU. Past Medical History  Diagnosis Date  . Genital herpes   . Gonorrhea    Past Surgical History  Procedure Laterality Date  . Cesarean section     No family history on file. History  Substance Use Topics  . Smoking status: Never Smoker   . Smokeless tobacco: Never Used  . Alcohol Use: No   OB History   Grav Para Term Preterm Abortions TAB SAB Ect Mult Living   4 1 1  2 1 1   1      Review of Systems  Cardiovascular: Positive for syncope.  Gastrointestinal: Positive for abdominal pain.  Genitourinary: Positive for vaginal bleeding.      Allergies  Review of patient's allergies indicates no known allergies.  Home Medications   Prior to Admission medications   Medication Sig Start Date End Date Taking? Authorizing Provider  ibuprofen (ADVIL,MOTRIN) 800 MG tablet Take 1 tablet (800 mg total) by mouth 3 (three) times daily. 04/18/14   Jean Rosenthal, NP   promethazine (PHENERGAN) 25 MG tablet Take 1 tablet (25 mg total) by mouth every 6 (six) hours as needed for nausea or vomiting. 04/18/14   Jean Rosenthal, NP   BP 119/77  Pulse 71  Temp(Src) 98.3 F (36.8 C) (Oral)  Resp 18  SpO2 100%  LMP 01/29/2014 Physical Exam  Nursing note and vitals reviewed. Constitutional: She is oriented to person, place, and time. She appears well-developed and well-nourished.  HENT:  Head: Normocephalic.  Eyes: Pupils are equal, round, and reactive to light.  Neck: Normal range of motion.  Cardiovascular:  On admission, hypotensive- after IVF and exam, normotensive  Pulmonary/Chest: Effort normal.  Abdominal: Soft.  Genitourinary:  Large clots with large amount of bleeding in vault; after clots removed, bleeding appeared to be controlled; os closed  Musculoskeletal: Normal range of motion.  Neurological: She is alert and oriented to person, place, and time.  Skin: Skin is warm. She is diaphoretic.  Psychiatric: She has a normal mood and affect.    ED Course  Procedures (including critical care time) Labs Review Labs Reviewed  CBC - Abnormal; Notable for the following:    Hemoglobin 11.9 (*)    HCT 35.4 (*)    All other components within normal limits  SAMPLE TO BLOOD BANK    Imaging Review US Ob Comp Less 14 Wks  04/18/2014   CLINICAL DATA:  Bleeding for 1 day  EXAM: OBSTETRIC <14 WK [place] AND TRANSVAGINAL OB US  TECHNIQUE: Both transabdominal and transvaginal ultrasound examinations were performed for complete evaluation of the gestation as well as the maternal uterus, adnexal regions, and pelvic cul-de-sac. Transvaginal technique was performed to assess early pregnancy.  COMPARISON:  None.  FINDINGS: Intrauterine gestational sac: Single gestational sac identified  Yolk sac:  No yolk sac identified  Embryo:  An embryo is present  Cardiac Activity: No cardiac activity detected  Heart Rate: 0 bpm  CRL:   20.0  mm   8 w 5 d                  US EDC:  11/23/2013  Maternal uterus/adnexae: Small subchorionic hemorrhage. Ovaries are normal. No free fluid.  IMPRESSION: Findings meet definitive criteria for failed pregnancy. This follows SRU consensus guidelines: Diagnostic Criteria for Nonviable Pregnancy Early in the First Trimester. Macy Mis Engl J Med [id number];369:1443-51.   Electronically Signed   By: Esperanza Heiraymond  Rubner M.D.   On: 04/18/2014 19:54   [place] Ob Transvaginal  04/18/2014   CLINICAL DATA:  Bleeding for 1 day  EXAM: OBSTETRIC <14 WK US AND TRANSVAGINAL OB US  TECHNIQUE: Both transabdominal and transvaginal ultrasound examinations were performed for complete evaluation of the gestation as well as the maternal uterus, adnexal regions, and pelvic cul-de-sac. Transvaginal technique was performed to assess early pregnancy.  COMPARISON:  None.  FINDINGS: Intrauterine gestational sac: Single gestational sac identified  Yolk sac:  No yolk sac identified  Embryo:  An embryo is present  Cardiac Activity: No cardiac activity detected  Heart Rate: 0 bpm  CRL:   20.0  mm   8 w 5 d                  US EDC: 11/23/2013  Maternal uterus/adnexae: Small subchorionic hemorrhage. Ovaries are normal. No free fluid.  IMPRESSION: Findings meet definitive criteria for failed pregnancy. This follows SRU consensus guidelines: Diagnostic Criteria for Nonviable Pregnancy Early in the First Trimester. Macy Mis Engl J Med [phone];369:1443-51.   Electronically Signed   By: Esperanza Heiraymond  Rubner M.D.   On: 04/18/2014 19:54     EKG Interpretation None      Results for orders placed during the hospital encounter of 04/19/14 (from the past 24 hour(s))  CBC     Status: Abnormal   Collection Time    04/19/14 10:30 AM      Result Value Ref Range   WBC 5.2  4.0 - 10.5 K/uL   RBC 4.20  3.87 - 5.11 MIL/uL   Hemoglobin 11.9 (*) 12.0 - 15.0 g/dL   HCT 30.835.4 (*) 65.736.0 - 84.646.0 %   MCV 84.3  78.0 - 100.0 fL   MCH 28.3  26.0 - 34.0 pg   MCHC 33.6  30.0 - 36.0 g/dL   RDW 96.213.7  95.211.5 - 84.115.5 %   Platelets 197  150 -  400 K/uL  IV LR 2 liters Toradol 30mg  IV Results for orders placed during the hospital encounter of 04/19/14 (from the past 24 hour(s))  CBC     Status: Abnormal   Collection Time    04/19/14 10:30 AM      Result Value Ref Range   WBC 5.2  4.0 - 10.5 K/uL   RBC 4.20  3.87 - 5.11 MIL/uL   Hemoglobin 11.9 (*) 12.0 - 15.0 g/dL   HCT 32.435.4 (*) 40.136.0 - 02.746.0 %   MCV 84.3  78.0 - 100.0 fL   MCH 28.3  26.0 - 34.0 pg   MCHC 33.6  30.0 - 36.0 g/dL   RDW 16.1  09.6 - 04.5 %   Platelets 197  150 - 400 K/uL  CBC     Status: Abnormal   Collection Time    04/19/14 12:52 PM      Result Value Ref Range   WBC 6.4  4.0 - 10.5 K/uL   RBC 3.36 (*) 3.87 - 5.11 MIL/uL   Hemoglobin 9.4 (*) 12.0 - 15.0 g/dL   HCT 40.9 (*) 81.1 - 91.4 %   MCV 83.9  78.0 - 100.0 fL   MCH 28.0  26.0 - 34.0 pg   MCHC 33.3  30.0 - 36.0 g/dL   RDW 78.2  95.6 - 21.3 %   Platelets 135 (*) 150 - 400 K/uL  repeat Toradol 30mg  IV Dr. Macon Large called about labs; will come see pt MDM   Final diagnoses:  None    Attestation of Attending Supervision of Advanced Practitioner (PA/CNM/NP): Evaluation and management procedures were performed by the Advanced Practitioner under my supervision and collaboration.  I have reviewed the Advanced Practitioner's note and chart, and I agree with the management and plan.  Symptomatic anemia after SAB; symptoms resolved after IV fluids. Patient declined transfusion No active bleeding noted on exam, no need for surgical intervention. Completed miscarriage noted on exam and imaging Patient discharged to home; will follow up in clinic in about 2-3 weeks Bleeding precautions reviewed.    Jaynie Collins, MD, FACOG Attending Obstetrician & Gynecologist Faculty Practice, [facility]

## 2014-04-19 NOTE — MAU Note (Signed)
Pt. Back from BR tolerated well.

## 2014-04-19 NOTE — MAU Note (Signed)
Pt. Presented in triage after passing out in waiting room. Pt. Having heavy bleeding. Pt. was seen yesterday and given cytotec.

## 2014-04-19 NOTE — MAU Note (Signed)
Patient states was seen in MAU yesterday and given Cytotec. States she used the cytotec vaginally last night. States she has been having cramping and bleeding but this am she passed out and fell. Her [name] states she hit her head. Feels lightheaded now.

## 2014-04-19 NOTE — MAU Note (Signed)
o vaginal bleeding noted.

## 2014-04-19 NOTE — MAU Note (Signed)
Pt. Received cytotec for failed pregnancy.

## 2014-04-19 NOTE — MAU Note (Signed)
Pt. Did not tolerate orthostatic check, IV fluids opened and NP notified. Pt. Is diaphoretic and feels weak.

## 2014-05-03 ENCOUNTER — Encounter: Payer: Medicaid Other | Admitting: Obstetrics and Gynecology

## 2014-05-04 ENCOUNTER — Telehealth: Payer: Self-pay

## 2014-05-04 NOTE — Telephone Encounter (Signed)
Patient came into clinic today after missing appointment yesterday to f/u SAB after cytotec. Patient informed no appointments until September and offered appointment in any of the other three associated offices-- patient shakes her head and states doesn't want to go and asks if she really needs an appointment. Asked patient how she is feeling and if she has passed clots. Patient states "I feel fine." and states that she has passed clots and is now just spotting. Asked patient if she wishes to have an appointment to discuss birth control or any questions she may have. Patient states "no I can get birth control when I want it." Informed patient that she doesn't need to make an appointment but advised that she call clinic or return to MAU if spotting does not stop. Patient verbalized understanding. No questions or concerns.

## 2014-07-31 ENCOUNTER — Encounter (HOSPITAL_COMMUNITY): Payer: Self-pay | Admitting: *Deleted

## 2014-09-29 NOTE — L&D Delivery Note (Cosign Needed)
Delivery Note At 2:38 PM a  female was delivered via VBAC, Spontaneous (Presentation: ; Occiput Anterior).  Variable decels to 60's upon arrival to the room with infant crowning.  NICU team in room.  Maternal pushing efforts sub-optimal.  Dr. Clearance Coots notified for vacuum.  Episiotomy cut, infant delivered.  Large amount of green meconium with delivery of infant.  Cord double clamped and cut. Dr. Clearance Coots and Dr. Shawnie Pons arrived. Infant taken to warmer by nursing staff.    APGAR: 1, 4; weight 5 lb 4 oz (2380 g).   Placenta status: Intact, Spontaneous.  Cord: 3 vessels with the following complications: None.  Cord pH: sent.  Anesthesia: Local  Episiotomy: Median Lacerations: None Suture Repair: 3.0 vicryl Est. Blood Loss (mL): 70  Mom to postpartum.  Baby to NICU.  Baily Serpe A Michial Disney 05/11/2015, 4:25 PM

## 2014-10-03 ENCOUNTER — Encounter (HOSPITAL_COMMUNITY): Payer: Self-pay | Admitting: *Deleted

## 2014-10-03 ENCOUNTER — Inpatient Hospital Stay (HOSPITAL_COMMUNITY): Payer: Medicaid Other

## 2014-10-03 ENCOUNTER — Inpatient Hospital Stay (HOSPITAL_COMMUNITY)
Admission: AD | Admit: 2014-10-03 | Discharge: 2014-10-03 | Disposition: A | Discharge status: Home / Self Care | Payer: Medicaid Other | Source: Ambulatory Visit | Attending: Obstetrics | Admitting: Obstetrics

## 2014-10-03 DIAGNOSIS — O209 Hemorrhage in early pregnancy, unspecified: Secondary | ICD-10-CM | POA: Diagnosis not present

## 2014-10-03 DIAGNOSIS — Z3A11 11 weeks gestation of pregnancy: Secondary | ICD-10-CM | POA: Insufficient documentation

## 2014-10-03 DIAGNOSIS — O418X10 Other specified disorders of amniotic fluid and membranes, first trimester, not applicable or unspecified: Secondary | ICD-10-CM

## 2014-10-03 DIAGNOSIS — O468X1 Other antepartum hemorrhage, first trimester: Secondary | ICD-10-CM

## 2014-10-03 DIAGNOSIS — O9989 Other specified diseases and conditions complicating pregnancy, childbirth and the puerperium: Secondary | ICD-10-CM

## 2014-10-03 DIAGNOSIS — R1030 Lower abdominal pain, unspecified: Secondary | ICD-10-CM | POA: Insufficient documentation

## 2014-10-03 DIAGNOSIS — O43891 Other placental disorders, first trimester: Secondary | ICD-10-CM

## 2014-10-03 DIAGNOSIS — O26899 Other specified pregnancy related conditions, unspecified trimester: Secondary | ICD-10-CM

## 2014-10-03 DIAGNOSIS — R109 Unspecified abdominal pain: Secondary | ICD-10-CM

## 2014-10-03 LAB — WET PREP, GENITAL
CLUE CELLS WET PREP: NONE SEEN
TRICH WET PREP: NONE SEEN
YEAST WET PREP: NONE SEEN

## 2014-10-03 LAB — CBC
HCT: 34.2 % — ABNORMAL LOW (ref 36.0–46.0)
Hemoglobin: 10.8 g/dL — ABNORMAL LOW (ref 12.0–15.0)
MCH: 25 pg — AB (ref 26.0–34.0)
MCHC: 31.6 g/dL (ref 30.0–36.0)
MCV: 79.2 fL (ref 78.0–100.0)
PLATELETS: 178 10*3/uL (ref 150–400)
RBC: 4.32 MIL/uL (ref 3.87–5.11)
RDW: 19.7 % — ABNORMAL HIGH (ref 11.5–15.5)
WBC: 3.1 10*3/uL — AB (ref 4.0–10.5)

## 2014-10-03 LAB — URINALYSIS, ROUTINE W REFLEX MICROSCOPIC
BILIRUBIN URINE: NEGATIVE
Glucose, UA: NEGATIVE mg/dL
HGB URINE DIPSTICK: NEGATIVE
Ketones, ur: 15 mg/dL — AB
Leukocytes, UA: NEGATIVE
Nitrite: NEGATIVE
Protein, ur: NEGATIVE mg/dL
Specific Gravity, Urine: 1.025 (ref 1.005–1.030)
UROBILINOGEN UA: 0.2 mg/dL (ref 0.0–1.0)
pH: 6 (ref 5.0–8.0)

## 2014-10-03 LAB — HIV ANTIBODY: HIV: NONREACTIVE

## 2014-10-03 LAB — HCG, QUANTITATIVE, PREGNANCY: hCG, Beta Chain, Quant, S: 100480 m[IU]/mL — ABNORMAL HIGH (ref <5)

## 2014-10-03 LAB — POCT PREGNANCY, URINE: Preg Test, Ur: POSITIVE — AB

## 2014-10-03 MED ORDER — CONCEPT OB 130-92.4-1 MG PO CAPS: 1.0000 | ORAL_CAPSULE | Freq: Every day | ORAL | Status: DC

## 2014-10-03 NOTE — MAU Provider Note (Signed)
Chief Complaint: Possible Pregnancy and Abdominal Cramping   First Provider Initiated Contact with Patient 10/03/14 1031     SUBJECTIVE HPI: Stacy Kelley is a 24 y.o. G5P1021 at [redacted]w[redacted]d by LMP who presents with  With bil lower abd cramping pain x 2-3 days. Pt reports + home preg test. Denies n/v, fever/chills, dysuria, hematuria, constipation, diarrhea. Pt denies vaginal discharge, reports thin normal vaginal discharge, denies increase or change in discharge. Pt denies pain with intercourse, reports last intercourse 1/4. Reports in monogamous relationship, denies concern for STDs. Reports history of HSV, unknown last pap smear, pt uncertain of LMP, using estimated date.  Past Medical History  Diagnosis Date  . Genital herpes   . Gonorrhea    OB History  Gravida Para Term Preterm AB SAB TAB Ectopic Multiple Living  # Outcome Date GA Lbr Len/2nd Weight Sex Delivery Anes PTL Lv  5 Current           4 SAB 2015          3 TAB 2012          2 Term 2010 [redacted]w[redacted]d   M CS-LTranv  N Y     Comments: System Generated. Please review and update pregnancy details.  1 Gravida              Past Surgical History  Procedure Laterality Date  . Cesarean section     History   Social History  . Marital Status: Single    Spouse Name: N/A    Number of Children: 1  . Years of Education: N/A   Occupational History  .  Unemployed   Social History Main Topics  . Smoking status: Never Smoker   . Smokeless tobacco: Never Used  . Alcohol Use: No  . Drug Use: Yes    Special: Marijuana     Comment: no longer smoking since pregnancy  . Sexual Activity:    Partners: Male    Pharmacist, hospital Protection: None   Other Topics Concern  . Not on file   Social History Narrative   No current facility-administered medications on file prior to encounter.   Current Outpatient Prescriptions on File Prior to Encounter  Medication Sig Dispense Refill  . ibuprofen (ADVIL,MOTRIN) 800 MG tablet  Take 1 tablet (800 mg total) by mouth 3 (three) times daily. (Patient not taking: Reported on 10/03/2014) 21 tablet 0  . promethazine (PHENERGAN) 25 MG tablet Take 1 tablet (25 mg total) by mouth every 6 (six) hours as needed for nausea or vomiting. (Patient not taking: Reported on 10/03/2014) 30 tablet 0   No Known Allergies  ROS: Pertinent items in HPI  OBJECTIVE Blood pressure 118/67, pulse 82, temperature 98.7 F (37.1 C), temperature source Oral, resp. rate 16, height  (1.626 m), weight 46.72 kg (103 lb), last menstrual period 07/14/2014, unknown if currently breastfeeding. GENERAL: Well-developed, well-nourished female in no acute distress.  HEENT: Normocephalic HEART: normal rate, s1s2 heard RESP: normal effort, bil lungs clear to auscultation ABDOMEN: Soft, non-tender, + bowel sounds throughout EXTREMITIES: Nontender, no edema NEURO: Alert and oriented SPECULUM EXAM: NEFG, physiologic discharge, no blood noted, cervix ulcerations from 10-4 o'clock position with 2 small round vesicular lesions. BIMANUAL: cervix anterior and closed; uterus normal size, no adnexal tenderness or masses, no CMT.  LAB RESULTS Results for orders placed or performed during the hospital encounter of 10/03/14 (from the past 24 hour(s))  Urinalysis, Routine w reflex microscopic     Status: Abnormal   Collection Time: 10/03/14  9:15 AM  Result Value Ref Range   Color, Urine YELLOW YELLOW   APPearance CLEAR CLEAR   Specific Gravity, Urine 1.025 1.005 - 1.030   pH 6.0 5.0 - 8.0   Glucose, UA NEGATIVE NEGATIVE mg/dL   Hgb urine dipstick NEGATIVE NEGATIVE   Bilirubin Urine NEGATIVE NEGATIVE   Ketones, ur 15 (A) NEGATIVE mg/dL   Protein, ur NEGATIVE NEGATIVE mg/dL   Urobilinogen, UA 0.2 0.0 - 1.0 mg/dL   Nitrite NEGATIVE NEGATIVE   Leukocytes, UA NEGATIVE NEGATIVE  Pregnancy, urine POC     Status: Abnormal   Collection Time: 10/03/14  9:20 AM  Result Value Ref Range   Preg Test, Ur POSITIVE (A)  NEGATIVE  Wet prep, genital     Status: Abnormal   Collection Time: 10/03/14 10:40 AM  Result Value Ref Range   Yeast Wet Prep HPF POC NONE SEEN NONE SEEN   Trich, Wet Prep NONE SEEN NONE SEEN   Clue Cells Wet Prep HPF POC NONE SEEN NONE SEEN   WBC, Wet Prep HPF POC FEW (A) NONE SEEN  CBC     Status: Abnormal   Collection Time: 10/03/14 10:52 AM  Result Value Ref Range   WBC 3.1 (L) 4.0 - 10.5 K/uL   RBC 4.32 3.87 - 5.11 MIL/uL   Hemoglobin 10.8 (L) 12.0 - 15.0 g/dL   HCT 09.834.2 (L) 11.936.0 - 14.746.0 %   MCV 79.2 78.0 - 100.0 fL   MCH 25.0 (L) 26.0 - 34.0 pg   MCHC 31.6 30.0 - 36.0 g/dL   RDW 82.919.7 (H) 56.211.5 - 13.015.5 %   Platelets 178 150 - 400 K/uL    IMAGING [place] Ob Comp Less 14 Wks  10/03/2014   CLINICAL DATA:  Abdominal pain, mid pelvic pain for 1 week.  EXAM: OBSTETRIC <14 WK US AND TRANSVAGINAL OB US  TECHNIQUE: Both transabdominal and transvaginal ultrasound examinations were performed for complete evaluation of the gestation as well as the maternal uterus, adnexal regions, and pelvic cul-de-sac. Transvaginal technique was performed to assess early pregnancy.  COMPARISON:  None.  FINDINGS: Intrauterine gestational sac: Visualized/normal in shape.  Yolk sac:  Visualized  Embryo:  Visualized  Cardiac Activity: Visualized  Heart Rate:  173 bpm  MSD:    mm    w     d  CRL:   15.8  mm   8 w 0 d                  US EDC: 05/15/2015  Maternal uterus/adnexae: Possible trace subchorionic hemorrhage. No adnexal masses. No free fluid.  IMPRESSION: Eight week intrauterine pregnancy. Fetal heart rate 173 beats per min. Questionable trace subchorionic hemorrhage.   Electronically Signed   By: Charlett NoseKevin  Dover M.D.   On: 10/03/2014 12:11   [place] Ob Transvaginal  10/03/2014   CLINICAL DATA:  Abdominal pain, mid pelvic pain for 1 week.  EXAM: OBSTETRIC <14 WK US AND TRANSVAGINAL OB US  TECHNIQUE: Both transabdominal and transvaginal ultrasound examinations were performed for complete evaluation of the gestation as well as  the maternal uterus, adnexal regions, and pelvic cul-de-sac. Transvaginal technique was performed to assess early pregnancy.  COMPARISON:  None.  FINDINGS: Intrauterine gestational sac: Visualized/normal in shape.  Yolk sac:  Visualized  Embryo:  Visualized  Cardiac Activity: Visualized  Heart Rate:  173 bpm  MSD:    mm  w     d  CRL:   15.8  mm   8 w 0 d                  [place] EDC: 05/15/2015  Maternal uterus/adnexae: Possible trace subchorionic hemorrhage. No adnexal masses. No free fluid.  IMPRESSION: Eight week intrauterine pregnancy. Fetal heart rate 173 beats per min. Questionable trace subchorionic hemorrhage.   Electronically Signed   By: Charlett Nose M.D.   On: 10/03/2014 12:11   MAU COURSE Unable to obtain FHT via doppler. UA, Upt, CBC, bHCG, Wet Prep and GC, [place] to evaluate and r/o ectopic preg. Confirmed IUP with FHT of 173.   ASSESSMENT 1. Abdominal pain in pregnancy   2. Abdominal pain affecting pregnancy, antepartum   Confirmed IUP with small subchorionic hemorrhage  PLAN Discharge home GC results pending Pt previously seen by [facility], educated on need to establish care, eval HSV and cervical lesions. Pt educated on first trimester pregnancy and need to consistency take prenatal vitamins. Return if experiences vaginal bleeding, increased pain, fever or prolonged n/v Pregnancy verification letter.  Follow-up Information    Follow up with [facility].   Why:  To establish care   Contact information:   [address] [place] [address] [phone]       Medication List    ASK your doctor about these medications        ibuprofen 800 MG tablet  Commonly known as:  ADVIL,MOTRIN  Take 1 tablet (800 mg total) by mouth 3 (three) times daily.     promethazine 25 MG tablet  Commonly known as:  PHENERGAN  Take 1 tablet (25 mg total) by mouth every 6 (six) hours as needed for nausea or vomiting.       Micker Samios, FNP-S  I was  present for the exam and agree with above.  Bruno, CNM 10/03/2014 10:40 PM

## 2014-10-03 NOTE — MAU Note (Signed)
Been having little cramps.  Believe may be pregnant.  Has not done home test, lmp  Nov?

## 2014-10-03 NOTE — Discharge Instructions (Signed)
You were seen today with a confirmed intrauterine pregnancy with an expected delivery date of 05/15/15. You have a small subchorionic hemorrhage, see instructions below. It is important that you follow up with [facility] as soon as possible for your history of HSV.

## 2014-10-03 NOTE — MAU Note (Signed)
+   UPT test today in MAU; unsure of last period; c/o mild pelvic cramping for past week; denies any bleeding; hx of ectopic pregnancies; hx of HSV;

## 2014-10-04 LAB — GC/CHLAMYDIA PROBE AMP
CT Probe RNA: NEGATIVE
GC Probe RNA: NEGATIVE

## 2014-11-01 ENCOUNTER — Telehealth: Payer: Self-pay | Admitting: *Deleted

## 2014-11-01 NOTE — Telephone Encounter (Signed)
Patient is pregnant and interested in setting up a NOB appointment. Patient has been scheduled for NOB with Dr. Clearance CootsHarper on 11-29-14 @ 1 pm. Patient advised if the midwife;s schedule becomes available before then that we would try to get her in sooner. Patient advised of no show policy. Patient visualized understanding. Patient advised of balance owed. Per Olegario MessierKathy patient will pay $75 on the day of her appointment.

## 2014-11-02 ENCOUNTER — Other Ambulatory Visit: Payer: Self-pay | Admitting: *Deleted

## 2014-11-02 DIAGNOSIS — Z3481 Encounter for supervision of other normal pregnancy, first trimester: Secondary | ICD-10-CM

## 2014-11-02 MED ORDER — OB COMPLETE PETITE 35-5-1-200 MG PO CAPS: 1.0000 | ORAL_CAPSULE | Freq: Every day | ORAL | Status: DC

## 2014-11-02 NOTE — Progress Notes (Signed)
Patient requesting new prescription for different prenatal vitamins. Patient states current prenatal vitamins are making her sick. Prescription sent to the pharmacy and patient aware.

## 2014-11-07 ENCOUNTER — Emergency Department (HOSPITAL_COMMUNITY)
Admission: EM | Admit: 2014-11-07 | Discharge: 2014-11-07 | Disposition: A | Discharge status: Home / Self Care | Payer: Medicaid Other | Attending: Emergency Medicine | Admitting: Emergency Medicine

## 2014-11-07 ENCOUNTER — Encounter (HOSPITAL_COMMUNITY): Payer: Self-pay

## 2014-11-07 DIAGNOSIS — Z79899 Other long term (current) drug therapy: Secondary | ICD-10-CM | POA: Diagnosis not present

## 2014-11-07 DIAGNOSIS — Y9389 Activity, other specified: Secondary | ICD-10-CM | POA: Diagnosis not present

## 2014-11-07 DIAGNOSIS — Z8619 Personal history of other infectious and parasitic diseases: Secondary | ICD-10-CM | POA: Insufficient documentation

## 2014-11-07 DIAGNOSIS — O9A211 Injury, poisoning and certain other consequences of external causes complicating pregnancy, first trimester: Secondary | ICD-10-CM | POA: Insufficient documentation

## 2014-11-07 DIAGNOSIS — S39012A Strain of muscle, fascia and tendon of lower back, initial encounter: Secondary | ICD-10-CM | POA: Insufficient documentation

## 2014-11-07 DIAGNOSIS — Y9289 Other specified places as the place of occurrence of the external cause: Secondary | ICD-10-CM | POA: Diagnosis not present

## 2014-11-07 DIAGNOSIS — Y998 Other external cause status: Secondary | ICD-10-CM | POA: Diagnosis not present

## 2014-11-07 DIAGNOSIS — R350 Frequency of micturition: Secondary | ICD-10-CM | POA: Insufficient documentation

## 2014-11-07 DIAGNOSIS — Z3490 Encounter for supervision of normal pregnancy, unspecified, unspecified trimester: Secondary | ICD-10-CM

## 2014-11-07 DIAGNOSIS — Z3A12 12 weeks gestation of pregnancy: Secondary | ICD-10-CM | POA: Insufficient documentation

## 2014-11-07 DIAGNOSIS — X58XXXA Exposure to other specified factors, initial encounter: Secondary | ICD-10-CM | POA: Diagnosis not present

## 2014-11-07 LAB — URINE MICROSCOPIC-ADD ON

## 2014-11-07 LAB — URINALYSIS, ROUTINE W REFLEX MICROSCOPIC
BILIRUBIN URINE: NEGATIVE
GLUCOSE, UA: NEGATIVE mg/dL
HGB URINE DIPSTICK: NEGATIVE
KETONES UR: NEGATIVE mg/dL
Leukocytes, UA: NEGATIVE
NITRITE: NEGATIVE
PH: 6 (ref 5.0–8.0)
Protein, ur: 30 mg/dL — AB
SPECIFIC GRAVITY, URINE: 1.04 — AB (ref 1.005–1.030)
Urobilinogen, UA: 0.2 mg/dL (ref 0.0–1.0)

## 2014-11-07 MED ORDER — ACETAMINOPHEN 325 MG PO TABS
650.0000 mg | ORAL_TABLET | Freq: Once | ORAL | Status: AC
  Administered 2014-11-07: 650 mg via ORAL
  Filled 2014-11-07: qty 2

## 2014-11-07 NOTE — Discharge Instructions (Signed)
If you were given medicines take as directed.  If you are on coumadin or contraceptives realize their levels and effectiveness is altered by many different medicines.  If you have any reaction (rash, tongues swelling, other) to the medicines stop taking and see a physician.   Please follow up as directed and return to the ER or see a physician for new or worsening symptoms.  Thank you. Filed Vitals:   11/07/14 0928  BP: 116/69  Pulse: 84  Temp: 98.2 F (36.8 C)  TempSrc: Oral  Resp: 18  SpO2: 100%

## 2014-11-07 NOTE — ED Provider Notes (Signed)
CSN: [id number]     Arrival date & time 11/07/14  0912 History   First MD Initiated Contact with Patient 11/07/14 [phone]     Chief Complaint  Patient presents with  . Back Pain     (Consider location/radiation/quality/duration/timing/severity/associated sxs/prior Treatment) HPI Comments: 24 year old female with history of lower back pain, miscarriage, normal pregnancy presents [redacted] weeks pregnant with bilateral lower back pain worse with movement. Patient has had this for months however worsened last night. Patient denies weakness or neurologic complaints, urinating normal, no dysuria. No fevers or chills. No vaginal bleeding.  Patient is a 24 y.o. female presenting with back pain. The history is provided by the patient.  Back Pain Associated symptoms: no abdominal pain, no chest pain, no dysuria, no fever and no headaches     Past Medical History  Diagnosis Date  . Genital herpes   . Gonorrhea    Past Surgical History  Procedure Laterality Date  . Cesarean section     Family History  Problem Relation Age of Onset  . Diabetes Maternal Grandmother   . Diabetes Maternal Grandfather   . Diabetes Paternal Grandmother   . Hypertension Paternal Grandfather   . Diabetes Paternal Grandfather    History  Substance Use Topics  . Smoking status: Never Smoker   . Smokeless tobacco: Never Used  . Alcohol Use: No   OB History    Gravida Para Term Preterm AB TAB SAB Ectopic Multiple Living   5 1 1  2 1 1   1      Review of Systems  Constitutional: Negative for fever and chills.  HENT: Negative for congestion.   Eyes: Negative for visual disturbance.  Respiratory: Negative for shortness of breath.   Cardiovascular: Negative for chest pain.  Gastrointestinal: Negative for vomiting and abdominal pain.  Genitourinary: Positive for frequency. Negative for dysuria and flank pain.  Musculoskeletal: Positive for back pain. Negative for neck pain and neck stiffness.  Skin: Negative for rash.   Neurological: Negative for light-headedness and headaches.      Allergies  Review of patient's allergies indicates no known allergies.  Home Medications   Prior to Admission medications   Medication Sig Start Date End Date Taking? Authorizing Provider  Prenat w/o A Vit-FeFum-FePo-FA (CONCEPT OB) 130-92.4-1 MG CAPS Take 1 tablet by mouth daily. Patient not taking: Reported on 11/07/2014 10/03/14   Dorathy KinsmanVirginia Smith, CNM  Prenat-FeCbn-FeAspGl-FA-Omega (OB COMPLETE PETITE) 35-5-1-200 MG CAPS Take 1 capsule by mouth daily. Patient not taking: Reported on 11/07/2014 11/02/14   Brock Badharles A Harper, MD   BP 116/69 mmHg  Pulse 84  Temp(Src) 98.2 F (36.8 C) (Oral)  Resp 18  SpO2 100%  LMP 08/14/2014 Physical Exam  Constitutional: She is oriented to person, place, and time. She appears well-developed and well-nourished.  HENT:  Head: Normocephalic and atraumatic.  Eyes: Conjunctivae are normal. Right eye exhibits no discharge. Left eye exhibits no discharge.  Neck: Normal range of motion. Neck supple. No tracheal deviation present.  Cardiovascular: Normal rate and regular rhythm.   Pulmonary/Chest: Effort normal and breath sounds normal.  Abdominal: Soft. She exhibits no distension. There is no tenderness. There is no guarding.  Musculoskeletal: She exhibits tenderness. She exhibits no edema.  Paraspinal lumbar and SI joints bilateral no midline tenderness.  Neurological: She is alert and oriented to person, place, and time.  Patient is 5+ strength lower extremity is bilateral sensation intact bilateral.  Skin: Skin is warm. No rash noted.  Psychiatric: She has a normal  mood and affect.  Nursing note and vitals reviewed.   ED Course  Procedures (including critical care time) EMERGENCY DEPARTMENT [place] PREGNANCY "Study: Limited Ultrasound of the Pelvis for Pregnancy"  INDICATIONS:Pregnancy(required) Multiple views of the uterus and pelvic cavity were obtained in real-time with a  multi-frequency probe.  APPROACH:Transabdominal   PERFORMED BY: Myself  IMAGES ARCHIVED?: Yes  LIMITATIONS: Decompressed bladder  PREGNANCY FREE FLUID: None  PREGNANCY FINDINGS: Intrauterine gestational sac noted, Fetal pole present and Fetal heart activity seen  INTERPRETATION: Viable intrauterine pregnancy  FETAL HEART RATE: 140     Labs Review Labs Reviewed  URINALYSIS, ROUTINE W REFLEX MICROSCOPIC - Abnormal; Notable for the following:    Color, Urine AMBER (*)    Specific Gravity, Urine 1.040 (*)    Protein, ur 30 (*)    All other components within normal limits  URINE MICROSCOPIC-ADD ON    Imaging Review No results found.   EKG Interpretation None      MDM   Final diagnoses:  Lumbar strain, initial encounter  Pregnancy   Well-appearing female with clinical edema as low back pain possibly related to pregnancy no injuries. Normal neurologic exam no fevers. Bedside ultrasound showed intrauterine pregnancy with normal heart rate. Discussed outpatient follow-up with OB/GYN, for pain and reasons to return. Patient has taken ibuprofen and instructed her to avoid and to take Tylenol or use ice/heat for pain.  Results and differential diagnosis were discussed with the patient/parent/guardian. Close follow up outpatient was discussed, comfortable with the plan.   Medications  acetaminophen (TYLENOL) tablet 650 mg (650 mg Oral Given 11/07/14 0959)    Filed Vitals:   11/07/14 0928  BP: 116/69  Pulse: 84  Temp: 98.2 F (36.8 C)  TempSrc: Oral  Resp: 18  SpO2: 100%    Final diagnoses:  Lumbar strain, initial encounter  Pregnancy        Enid Skeens, MD 11/07/14 1053

## 2014-11-07 NOTE — ED Notes (Signed)
Patient presents today with a chief complaint of bilateral lower back pain x several years with sudden onset of worsening pain last night. Denies urinary frequency, dysuria, urgency, hematuria. Denies recent injury, she is currently [redacted] weeks pregnant.

## 2014-11-15 ENCOUNTER — Other Ambulatory Visit: Payer: Medicaid Other

## 2014-11-16 ENCOUNTER — Encounter: Payer: Self-pay | Admitting: *Deleted

## 2014-11-16 ENCOUNTER — Encounter: Payer: Self-pay | Admitting: Certified Nurse Midwife

## 2014-11-16 ENCOUNTER — Ambulatory Visit (INDEPENDENT_AMBULATORY_CARE_PROVIDER_SITE_OTHER): Payer: Medicaid Other | Admitting: Certified Nurse Midwife

## 2014-11-16 DIAGNOSIS — Z3492 Encounter for supervision of normal pregnancy, unspecified, second trimester: Secondary | ICD-10-CM

## 2014-11-16 DIAGNOSIS — M545 Low back pain, unspecified: Secondary | ICD-10-CM | POA: Insufficient documentation

## 2014-11-16 DIAGNOSIS — Z3490 Encounter for supervision of normal pregnancy, unspecified, unspecified trimester: Secondary | ICD-10-CM

## 2014-11-16 DIAGNOSIS — G43019 Migraine without aura, intractable, without status migrainosus: Secondary | ICD-10-CM

## 2014-11-16 DIAGNOSIS — O219 Vomiting of pregnancy, unspecified: Secondary | ICD-10-CM

## 2014-11-16 DIAGNOSIS — Z3482 Encounter for supervision of other normal pregnancy, second trimester: Secondary | ICD-10-CM

## 2014-11-16 LAB — POCT URINALYSIS DIPSTICK
Bilirubin, UA: NEGATIVE
Blood, UA: NEGATIVE
Glucose, UA: NEGATIVE
KETONES UA: NEGATIVE
LEUKOCYTES UA: NEGATIVE
Nitrite, UA: NEGATIVE
PH UA: 5
PROTEIN UA: NEGATIVE
Spec Grav, UA: 1.015
Urobilinogen, UA: NEGATIVE

## 2014-11-16 MED ORDER — BUTALBITAL-APAP-CAFFEINE 50-325-40 MG PO TABS: 1.0000 | ORAL_TABLET | Freq: Four times a day (QID) | ORAL | Status: DC | PRN

## 2014-11-16 MED ORDER — ONDANSETRON HCL 4 MG PO TABS: 4.0000 mg | ORAL_TABLET | Freq: Every day | ORAL | Status: DC | PRN

## 2014-11-16 NOTE — Progress Notes (Signed)
Subjective:    Stacy Kelley is being seen today for her first obstetrical visit.  This is not a planned pregnancy. She is at [redacted]w[redacted]d gestation. Her obstetrical history is not significant for any risk factors. Relationship with [name]: significant other, not living together. Patient does not intend to breast feed. Pregnancy history fully reviewed. Complains of low-back pain from previous injury.    The information documented in the HPI was reviewed and verified.  Menstrual History: OB History    Gravida Para Term Preterm AB TAB SAB Ectopic Multiple Living   Menarche age: 20  Patient's last menstrual period was 08/14/2014.    Past Medical History  Diagnosis Date  . Genital herpes   . Gonorrhea     Past Surgical History  Procedure Laterality Date  . Cesarean section       (Not in a hospital admission) No Known Allergies  History  Substance Use Topics  . Smoking status: Never Smoker   . Smokeless tobacco: Never Used  . Alcohol Use: No    Family History  Problem Relation Age of Onset  . Diabetes Maternal Grandmother   . Diabetes Maternal Grandfather   . Diabetes Paternal Grandmother   . Hypertension Paternal Grandfather   . Diabetes Paternal Grandfather      Review of Systems Constitutional: negative for weight loss Gastrointestinal: negative for vomiting, does have nausea Genitourinary:negative for active genital lesions, has a history of HSV and vaginal discharge and dysuria Musculoskeletal: postive for back pain Behavioral/Psych: negative for abusive relationship, depression, illegal drug usage and tobacco use    Objective:    BP 118/73 mmHg  Pulse 90  Temp(Src) 98 F (36.7 C)  Wt 48.535 kg (107 lb)  LMP 08/14/2014 General Appearance:    Alert, cooperative, no distress, appears stated age  Head:    Normocephalic, without obvious abnormality, atraumatic  Eyes:    PERRL, conjunctiva/corneas clear, EOM's intact, fundi    benign, both eyes   Ears:    Normal TM's and external ear canals, both ears  Nose:   Nares normal, septum midline, mucosa normal, no drainage    or sinus tenderness  Throat:   Lips, mucosa, and tongue normal; teeth and gums normal  Neck:   Supple, symmetrical, trachea midline, no adenopathy;    thyroid:  no enlargement/tenderness/nodules; no carotid   bruit or JVD  Back:     Symmetric, no curvature, ROM normal, no CVA tenderness  Lungs:     Clear to auscultation bilaterally, respirations unlabored  Chest Wall:    No tenderness or deformity   Heart:    Regular rate and rhythm, S1 and S2 normal, no murmur, rub   or gallop  Breast Exam:    No tenderness, masses, or nipple abnormality  Abdomen:     Soft, non-tender, bowel sounds active all four quadrants,    no masses, no organomegaly  Genitalia:    Normal female without lesion, discharge or tenderness  Extremities:   Extremities normal, atraumatic, no cyanosis or edema  Pulses:   2+ and symmetric all extremities  Skin:   Skin color, texture, turgor normal, no rashes or lesions  Lymph nodes:   Cervical, supraclavicular, and axillary nodes normal  Neurologic:   CNII-XII intact, normal strength, sensation and reflexes    throughout      Lab Review Urine pregnancy test Labs reviewed yes Radiologic studies reviewed yes Assessment:  Anemia  Pregnancy at 5230w2d weeks   Lumbar back pain   Plan:      Prenatal vitamins.  Counseling provided regarding continued use of seat belts, cessation of alcohol consumption, smoking or use of illicit drugs; infection precautions i.e., influenza/TDAP immunizations, toxoplasmosis,CMV, parvovirus, listeria and varicella; workplace safety, exercise during pregnancy; routine dental care, safe medications, sexual activity, hot tubs, saunas, pools, travel, caffeine use, fish and methlymercury, potential toxins, hair treatments, varicose veins Weight gain recommendations per IOM guidelines reviewed: underweight/BMI< 18.5--> gain 28  - 40 lbs; normal weight/BMI 18.5 - 24.9--> gain 25 - 35 lbs; overweight/BMI 25 - 29.9--> gain 15 - 25 lbs; obese/BMI >30->gain  11 - 20 lbs Problem list reviewed and updated. FIRST/CF mutation testing/NIPT/QUAD SCREEN/fragile X/Ashkenazi Jewish population testing/Spinal muscular atrophy discussed: requested. Role of ultrasound in pregnancy discussed; fetal survey: requested. Amniocentesis discussed: not indicated.  Meds ordered this encounter  Medications  . butalbital-acetaminophen-caffeine (FIORICET) 50-325-40 MG per tablet    Sig: Take 1-2 tablets by mouth every 6 (six) hours as needed for headache.    Dispense:  20 tablet    Refill:  0  . ondansetron (ZOFRAN) 4 MG tablet    Sig: Take 1 tablet (4 mg total) by mouth daily as needed for nausea or vomiting.    Dispense:  30 tablet    Refill:  1   Orders Placed This Encounter  Procedures  . Culture, OB Urine  . US OB Comp + 14 Wk    Standing Status: Future     Number of Occurrences: 1     Standing Expiration Date: 01/15/2016    Order Specific Question:  Reason for Exam (SYMPTOM  OR DIAGNOSIS REQUIRED)    Answer:  Fetal Anatomy Scan    Order Specific Question:  Preferred imaging location?    Answer:  Internal  . AFP, Quad Screen    Order Specific Question:  Repeat Sample    Answer:  No    Order Specific Question:  Pregnancy Donor Egg (Y/N)    Answer:  No    Order Specific Question:  Gest Age at U/S (Wk.Dy)    Answer:  14.2    Order Specific Question:  Number of Fetuses    Answer:  1    Order Specific Question:  Hx of OSB/NTD?    Answer:  No    Order Specific Question:  History of Down Syndrome?    Answer:  No    Order Specific Question:  Maternal IDDM (insulin-dependent diabetes mellitus)    Answer:  No   Work letter written for decreased activity/weight lifting <20#.  Patient desires repeat C-section, primary c-section was performed for breech fetal presentation.   Urine sent for culture.  Needs new OB labs.  Fetal  anatomy ultrasound ordered.   Follow up in 4 weeks.

## 2014-11-16 NOTE — Progress Notes (Signed)
Patient is doing well with the pregnancy so far. Patient does have some back pain.

## 2014-11-17 LAB — PAP IG W/ RFLX HPV ASCU

## 2014-11-19 LAB — CULTURE, OB URINE

## 2014-11-20 ENCOUNTER — Other Ambulatory Visit: Payer: Self-pay | Admitting: *Deleted

## 2014-11-20 DIAGNOSIS — G43019 Migraine without aura, intractable, without status migrainosus: Secondary | ICD-10-CM

## 2014-11-20 LAB — AFP, QUAD SCREEN
AFP: 34.6 ng/mL
Curr Gest Age: 14.2 wks.days
Down Syndrome Scr Risk Est: 1:34700 {titer}
HCG TOTAL: 78.17 [IU]/mL
INH: 140.3 pg/mL
Interpretation-AFP: NEGATIVE
MOM FOR HCG: 0.9
MoM for AFP: 0.95
MoM for INH: 0.63
OPEN SPINA BIFIDA: NEGATIVE
Osb Risk: 1:32100 {titer}
Tri 18 Scr Risk Est: NEGATIVE
Trisomy 18 (Edward) Syndrome Interp.: 1:80300 {titer}
uE3 Mom: 1.24
uE3 Value: 0.68 ng/mL

## 2014-11-20 MED ORDER — BUTALBITAL-APAP-CAFFEINE 50-325-40 MG PO TABS: 1.0000 | ORAL_TABLET | Freq: Four times a day (QID) | ORAL | Status: DC | PRN

## 2014-11-29 ENCOUNTER — Encounter: Payer: Self-pay | Admitting: Obstetrics

## 2014-12-05 ENCOUNTER — Other Ambulatory Visit: Payer: Self-pay | Admitting: Certified Nurse Midwife

## 2014-12-05 DIAGNOSIS — Z1389 Encounter for screening for other disorder: Secondary | ICD-10-CM

## 2014-12-13 ENCOUNTER — Other Ambulatory Visit: Payer: Medicaid Other

## 2014-12-13 ENCOUNTER — Encounter: Payer: Medicaid Other | Admitting: Certified Nurse Midwife

## 2014-12-14 ENCOUNTER — Encounter: Payer: Medicaid Other | Admitting: Certified Nurse Midwife

## 2014-12-20 ENCOUNTER — Ambulatory Visit (INDEPENDENT_AMBULATORY_CARE_PROVIDER_SITE_OTHER): Payer: Medicaid Other | Admitting: Certified Nurse Midwife

## 2014-12-20 ENCOUNTER — Ambulatory Visit (INDEPENDENT_AMBULATORY_CARE_PROVIDER_SITE_OTHER): Payer: Medicaid Other

## 2014-12-20 VITALS — BP 114/69 | HR 76 | Temp 98.3°F | Wt 112.0 lb

## 2014-12-20 DIAGNOSIS — Z36 Encounter for antenatal screening of mother: Secondary | ICD-10-CM

## 2014-12-20 DIAGNOSIS — Z3482 Encounter for supervision of other normal pregnancy, second trimester: Secondary | ICD-10-CM | POA: Diagnosis not present

## 2014-12-20 DIAGNOSIS — Z1389 Encounter for screening for other disorder: Secondary | ICD-10-CM

## 2014-12-20 LAB — POCT URINALYSIS DIPSTICK
Bilirubin, UA: NEGATIVE
GLUCOSE UA: NEGATIVE
KETONES UA: NEGATIVE
Leukocytes, UA: NEGATIVE
Nitrite, UA: NEGATIVE
PH UA: 6
PROTEIN UA: NEGATIVE
RBC UA: NEGATIVE
Spec Grav, UA: 1.02
Urobilinogen, UA: NEGATIVE

## 2014-12-20 LAB — US OB COMP + 14 WK

## 2014-12-20 NOTE — Progress Notes (Signed)
Subjective:    Mason Jimndrea M Brar is a 24 y.o. female being seen today for her obstetrical visit. She is at 5524w1d gestation. Patient reports: backache, no bleeding, no contractions, no cramping and no leaking . Fetal movement: normal.  Problem List Items Addressed This Visit    None    Visit Diagnoses    Encounter for supervision of other normal pregnancy in second trimester    -  Primary    Relevant Orders    POCT urinalysis dipstick (Completed)    HIV antibody    Obstetric panel    Hemoglobinopathy evaluation    Varicella zoster antibody, IgG    Vit D  25 hydroxy (rtn osteoporosis monitoring)      Patient Active Problem List   Diagnosis Date Noted  . Low back pain 11/16/2014   Objective:    BP 114/69 mmHg  Pulse 76  Temp(Src) 98.3 F (36.8 C)  Wt 50.803 kg (112 lb)  LMP 08/14/2014 FHT: 150's BPM  Uterine Size: size equals dates and slighlty below umbilicus     Assessment:    Pregnancy @ 7124w1d    Plan:    Signs and symptoms of preterm labor: discussed. Fetal anatomy scan was completed today, results pending  Labs, problem list reviewed and updated 2 hr GTT planned Follow up in 4 weeks.

## 2014-12-21 LAB — OBSTETRIC PANEL
Antibody Screen: NEGATIVE
BASOS ABS: 0 10*3/uL (ref 0.0–0.1)
BASOS PCT: 0 % (ref 0–1)
EOS ABS: 0 10*3/uL (ref 0.0–0.7)
EOS PCT: 1 % (ref 0–5)
HCT: 38.3 % (ref 36.0–46.0)
HEP B S AG: NEGATIVE
Hemoglobin: 12.7 g/dL (ref 12.0–15.0)
Lymphocytes Relative: 20 % (ref 12–46)
Lymphs Abs: 0.8 10*3/uL (ref 0.7–4.0)
MCH: 27.8 pg (ref 26.0–34.0)
MCHC: 33.2 g/dL (ref 30.0–36.0)
MCV: 83.8 fL (ref 78.0–100.0)
MPV: 9.9 fL (ref 8.6–12.4)
Monocytes Absolute: 0.5 10*3/uL (ref 0.1–1.0)
Monocytes Relative: 14 % — ABNORMAL HIGH (ref 3–12)
NEUTROS PCT: 65 % (ref 43–77)
Neutro Abs: 2.5 10*3/uL (ref 1.7–7.7)
PLATELETS: 201 10*3/uL (ref 150–400)
RBC: 4.57 MIL/uL (ref 3.87–5.11)
RDW: 16.1 % — ABNORMAL HIGH (ref 11.5–15.5)
Rh Type: POSITIVE
Rubella: 23.1 Index — ABNORMAL HIGH (ref <0.90)
WBC: 3.9 10*3/uL — ABNORMAL LOW (ref 4.0–10.5)

## 2014-12-21 LAB — VARICELLA ZOSTER ANTIBODY, IGG: Varicella IgG: 3488 Index — ABNORMAL HIGH (ref <135.00)

## 2014-12-21 LAB — HIV ANTIBODY (ROUTINE TESTING W REFLEX): HIV: NONREACTIVE

## 2014-12-21 LAB — VITAMIN D 25 HYDROXY (VIT D DEFICIENCY, FRACTURES): Vit D, 25-Hydroxy: 23 ng/mL — ABNORMAL LOW (ref 30–100)

## 2014-12-22 LAB — HEMOGLOBINOPATHY EVALUATION
HGB A: 96.6 % — AB (ref 96.8–97.8)
HGB F QUANT: 0.7 % (ref 0.0–2.0)
Hemoglobin Other: 0 %
Hgb A2 Quant: 2.7 % (ref 2.2–3.2)
Hgb S Quant: 0 %

## 2015-01-17 ENCOUNTER — Ambulatory Visit (INDEPENDENT_AMBULATORY_CARE_PROVIDER_SITE_OTHER): Payer: Medicaid Other | Admitting: Certified Nurse Midwife

## 2015-01-17 VITALS — BP 119/76 | HR 74 | Temp 98.5°F | Wt 118.0 lb

## 2015-01-17 DIAGNOSIS — Z3482 Encounter for supervision of other normal pregnancy, second trimester: Secondary | ICD-10-CM

## 2015-01-17 NOTE — Progress Notes (Signed)
Subjective:    Stacy Kelley is a 24 y.o. female being seen today for her obstetrical visit. She is at 552w1d gestation. Patient reports no bleeding, no contractions, no cramping and no leaking. Fetal movement: normal.  Problem List Items Addressed This Visit    None    Visit Diagnoses    Supervision of normal pregnancy, antepartum, second trimester    -  Primary    Relevant Orders    US OB Follow Up    US OB Follow Up      Patient Active Problem List   Diagnosis Date Noted  . Low back pain 11/16/2014   Objective:    BP 119/76 mmHg  Pulse 74  Temp(Src) 98.5 F (36.9 C)  Wt 53.524 kg (118 lb)  LMP 08/14/2014 FHT:  150's BPM  Uterine Size: 20 cm and size less than dates  Presentation: unsure     Assessment:    Pregnancy @ 682w1d weeks   Plan:     labs reviewed, problem list updated OGTT planned for next visit Pediatrician: discussed. Infant feeding: undecided. Maternity leave: not applicable. Cigarette smoking: never smoked.  Orders Placed This Encounter  Procedures  . US OB Follow Up    Standing Status: Future     Number of Occurrences:      Standing Expiration Date: 03/18/2016    Order Specific Question:  Reason for Exam (SYMPTOM  OR DIAGNOSIS REQUIRED)    Answer:  Follow up fetal anatomy scan    Order Specific Question:  Preferred imaging location?    Answer:  Internal  . US OB Follow Up    Standing Status: Future     Number of Occurrences:      Standing Expiration Date: 03/18/2016    Order Specific Question:  Reason for Exam (SYMPTOM  OR DIAGNOSIS REQUIRED)    Answer:  Size < dates, f/u on anatomy    Order Specific Question:  Preferred imaging location?    Answer:  Internal   No orders of the defined types were placed in this encounter.   Follow up in 4 weeks with OGTT.

## 2015-01-24 ENCOUNTER — Ambulatory Visit (INDEPENDENT_AMBULATORY_CARE_PROVIDER_SITE_OTHER): Payer: Medicaid Other

## 2015-01-24 ENCOUNTER — Other Ambulatory Visit: Payer: Self-pay | Admitting: Certified Nurse Midwife

## 2015-01-24 DIAGNOSIS — Z3482 Encounter for supervision of other normal pregnancy, second trimester: Secondary | ICD-10-CM

## 2015-01-24 DIAGNOSIS — Z36 Encounter for antenatal screening of mother: Secondary | ICD-10-CM | POA: Diagnosis not present

## 2015-01-24 DIAGNOSIS — Z3689 Encounter for other specified antenatal screening: Secondary | ICD-10-CM

## 2015-01-24 LAB — US OB FOLLOW UP

## 2015-02-15 ENCOUNTER — Other Ambulatory Visit: Payer: Medicaid Other

## 2015-02-15 ENCOUNTER — Ambulatory Visit (INDEPENDENT_AMBULATORY_CARE_PROVIDER_SITE_OTHER): Payer: Medicaid Other | Admitting: Certified Nurse Midwife

## 2015-02-15 VITALS — BP 117/69 | HR 96 | Temp 98.7°F | Wt 129.0 lb

## 2015-02-15 DIAGNOSIS — Z3482 Encounter for supervision of other normal pregnancy, second trimester: Secondary | ICD-10-CM

## 2015-02-15 LAB — POCT URINALYSIS DIPSTICK
Bilirubin, UA: NEGATIVE
Glucose, UA: NEGATIVE
Ketones, UA: NEGATIVE
Leukocytes, UA: NEGATIVE
Nitrite, UA: NEGATIVE
PH UA: 5
Protein, UA: NEGATIVE
RBC UA: NEGATIVE
SPEC GRAV UA: 1.02
Urobilinogen, UA: NEGATIVE

## 2015-02-15 LAB — CBC
HEMATOCRIT: 37.7 % (ref 36.0–46.0)
HEMOGLOBIN: 12.4 g/dL (ref 12.0–15.0)
MCH: 28.5 pg (ref 26.0–34.0)
MCHC: 32.9 g/dL (ref 30.0–36.0)
MCV: 86.7 fL (ref 78.0–100.0)
MPV: 10 fL (ref 8.6–12.4)
Platelets: 172 10*3/uL (ref 150–400)
RBC: 4.35 MIL/uL (ref 3.87–5.11)
RDW: 15.8 % — ABNORMAL HIGH (ref 11.5–15.5)
WBC: 3.8 10*3/uL — AB (ref 4.0–10.5)

## 2015-02-15 NOTE — Progress Notes (Signed)
Subjective:    Stacy Kelley is a 24 y.o. female being seen today for her obstetrical visit. She is at 5338w2d gestation. Patient reports backache, no bleeding, no contractions, no cramping, no leaking and HA improved with Fioricet. Fetal movement: normal.  Low back pain improved with rest, pillow support, requested Rx for abdominal maternity support belt.  Is deciding on TOLAC for delivery.  Undecided on BC postpartum, discussed Mirena and information given.    Problem List Items Addressed This Visit    None    Visit Diagnoses    Encounter for supervision of other normal pregnancy in second trimester    -  Primary    Relevant Orders    POCT urinalysis dipstick    Glucose Tolerance, 2 Hours w/1 Hour    CBC    HIV antibody    RPR      Patient Active Problem List   Diagnosis Date Noted  . Low back pain 11/16/2014   Objective:    BP 117/69 mmHg  Pulse 96  Temp(Src) 98.7 F (37.1 C)  Wt 58.514 kg (129 lb)  LMP 08/14/2014 FHT:  154 BPM  Uterine Size: 26 cm and size equals dates  Presentation: cephalic     Assessment:    Pregnancy @ 2738w2d weeks   Doing well  Plan:     labs reviewed, problem list updated TOLAC, needs to sign consent.  Mirena IUD postpartum Labor classes/breastfeeding classes discussed, encouraged [facility] given.  Rhogam given for RH negative Pediatrician: discussed. Infant feeding: undecided. Maternity leave: discussed. Cigarette smoking: never smoked. Orders Placed This Encounter  Procedures  . Glucose Tolerance, 2 Hours w/1 Hour  . CBC  . HIV antibody  . RPR  . POCT urinalysis dipstick   No orders of the defined types were placed in this encounter.   Follow up in 2 Weeks.

## 2015-02-16 LAB — HIV ANTIBODY (ROUTINE TESTING W REFLEX): HIV 1&2 Ab, 4th Generation: NONREACTIVE

## 2015-02-16 LAB — RPR

## 2015-02-16 LAB — GLUCOSE TOLERANCE, 2 HOURS W/ 1HR
GLUCOSE, 2 HOUR: 98 mg/dL (ref 70–139)
Glucose, 1 hour: 115 mg/dL (ref 70–170)
Glucose, Fasting: 79 mg/dL (ref 70–99)

## 2015-03-01 ENCOUNTER — Ambulatory Visit (INDEPENDENT_AMBULATORY_CARE_PROVIDER_SITE_OTHER): Payer: Medicaid Other | Admitting: Certified Nurse Midwife

## 2015-03-01 VITALS — BP 105/64 | HR 72 | Temp 97.7°F | Wt 130.0 lb

## 2015-03-01 DIAGNOSIS — Z3483 Encounter for supervision of other normal pregnancy, third trimester: Secondary | ICD-10-CM | POA: Diagnosis not present

## 2015-03-01 LAB — POCT URINALYSIS DIPSTICK
Bilirubin, UA: NEGATIVE
Glucose, UA: NEGATIVE
Ketones, UA: NEGATIVE
Leukocytes, UA: NEGATIVE
Nitrite, UA: NEGATIVE
PH UA: 7
Protein, UA: NEGATIVE
RBC UA: NEGATIVE
Spec Grav, UA: 1.015
UROBILINOGEN UA: NEGATIVE

## 2015-03-02 NOTE — Progress Notes (Signed)
Subjective:    Stacy Kelley is a 24 y.o. female being seen today for her obstetrical visit. She is at 6358w3d gestation. Patient reports no bleeding, no leaking and occasional cramping about 1-2X/day.. Fetal movement: normal.  Problem List Items Addressed This Visit    None    Visit Diagnoses    Encounter for supervision of other normal pregnancy in third trimester    -  Primary    Relevant Orders    POCT urinalysis dipstick (Completed)      Patient Active Problem List   Diagnosis Date Noted  . Low back pain 11/16/2014   Objective:    BP 105/64 mmHg  Pulse 72  Temp(Src) 97.7 F (36.5 C)  Wt 130 lb (58.968 kg)  LMP 08/14/2014 FHT:  140's BPM  Uterine Size: size equals dates  Presentation: unsure     Assessment:    Pregnancy @ 6258w3d weeks   Plan:    Preterm labor precautions reviewed.     labs reviewed, problem list updated TDAP offered  Rhogam given for RH negative Pediatrician: discussed. Infant feeding: plans to breastfeed. Maternity leave: discussed. Cigarette smoking: never smoked. Orders Placed This Encounter  Procedures  . POCT urinalysis dipstick   No orders of the defined types were placed in this encounter.   Follow up in 2 Weeks.

## 2015-03-15 ENCOUNTER — Ambulatory Visit (INDEPENDENT_AMBULATORY_CARE_PROVIDER_SITE_OTHER): Payer: Medicaid Other | Admitting: Certified Nurse Midwife

## 2015-03-15 VITALS — BP 112/65 | HR 70 | Temp 97.8°F | Wt 132.0 lb

## 2015-03-15 DIAGNOSIS — Z3483 Encounter for supervision of other normal pregnancy, third trimester: Secondary | ICD-10-CM

## 2015-03-15 LAB — POCT URINALYSIS DIPSTICK
BILIRUBIN UA: NEGATIVE
Blood, UA: NEGATIVE
GLUCOSE UA: NEGATIVE
LEUKOCYTES UA: NEGATIVE
NITRITE UA: NEGATIVE
Protein, UA: NEGATIVE
Spec Grav, UA: 1.015
Urobilinogen, UA: NEGATIVE
pH, UA: 6.5

## 2015-03-15 NOTE — Progress Notes (Signed)
Subjective:    Stacy Kelley is a 24 y.o. female being seen today for her obstetrical visit. She is at [redacted]w[redacted]d gestation. Patient reports no bleeding, no leaking and occasional contractions. Fetal movement: normal.  Problem List Items Addressed This Visit    None    Visit Diagnoses    Encounter for supervision of other normal pregnancy in third trimester    -  Primary    Relevant Orders    POCT urinalysis dipstick (Completed)      Patient Active Problem List   Diagnosis Date Noted  . Low back pain 11/16/2014   Objective:    BP 112/65 mmHg  Pulse 70  Temp(Src) 97.8 F (36.6 C)  Wt 132 lb (59.875 kg)  LMP 08/14/2014 FHT:  143 BPM  Uterine Size: 31 cm and size equals dates  Presentation: cephalic     Assessment:    Pregnancy @ [redacted]w[redacted]d weeks  Doing Well Plan:     labs reviewed, problem list updated Consent signed. GBS sent TDAP offered  Rhogam given for RH negative Pediatrician: discussed. Infant feeding: plans to breastfeed. Maternity leave: discussed, [employer] Employer stated she will need a note for more than 6 weeks of FMLA. Cigarette smoking: never smoked. Orders Placed This Encounter  Procedures  . POCT urinalysis dipstick   No orders of the defined types were placed in this encounter.   Follow up in 2 Weeks.

## 2015-03-28 ENCOUNTER — Other Ambulatory Visit: Payer: Self-pay | Admitting: Certified Nurse Midwife

## 2015-03-29 ENCOUNTER — Ambulatory Visit (INDEPENDENT_AMBULATORY_CARE_PROVIDER_SITE_OTHER): Payer: Medicaid Other | Admitting: Certified Nurse Midwife

## 2015-03-29 VITALS — BP 115/74 | HR 81 | Temp 97.5°F | Wt 132.0 lb

## 2015-03-29 DIAGNOSIS — Z3493 Encounter for supervision of normal pregnancy, unspecified, third trimester: Secondary | ICD-10-CM | POA: Diagnosis not present

## 2015-03-29 LAB — POCT URINALYSIS DIPSTICK
BILIRUBIN UA: NEGATIVE
Ketones, UA: NEGATIVE
NITRITE UA: NEGATIVE
RBC UA: NEGATIVE
Spec Grav, UA: 1.02
Urobilinogen, UA: NEGATIVE
pH, UA: 5

## 2015-03-29 NOTE — Progress Notes (Signed)
Subjective:    Stacy Kelley is a 24 y.o. female being seen today for her obstetrical visit. She is at 2958w2d gestation. Patient reports backache, no bleeding, no contractions, no cramping and no leaking. Fetal movement: normal.  Problem List Items Addressed This Visit    None    Visit Diagnoses    Prenatal care, third trimester    -  Primary    Relevant Orders    POCT urinalysis dipstick (Completed)      Patient Active Problem List   Diagnosis Date Noted  . Low back pain 11/16/2014   Objective:    BP 115/74 mmHg  Pulse 81  Temp(Src) 97.5 F (36.4 C)  Wt 132 lb (59.875 kg)  LMP 08/14/2014 FHT:  145 BPM  Uterine Size: size equals dates  Presentation: cephalic     Assessment:    Pregnancy @ 5658w2d weeks   Doing well.    Plan:     labs reviewed, problem list updated RX: maternity support belt GBS sent TDAP offered  Rhogam given for RH negative Pediatrician: discussed. Infant feeding: plans to breastfeed. Maternity leave: discussed.  She spoke with Erskine SquibbJane about reduced hours/work schedule at [employer] & FMLA paperwork Cigarette smoking: never smoked. Orders Placed This Encounter  Procedures  . POCT urinalysis dipstick   No orders of the defined types were placed in this encounter.   Follow up in 2 Weeks.

## 2015-04-02 IMAGING — US US OB COMP LESS 14 WK
1 series · 14 of 28 positions shown · non-contrast
Comparison: None.

EXAM:
OBSTETRIC <14 WK ULTRASOUND
TECHNIQUE: Transabdominal ultrasound was performed for evaluation of the
gestation as well as the maternal uterus and adnexal regions.

[Series 1: us ob comp less 14 wks · 43 acquisitions, 14 frames shown]
[im 2/43]
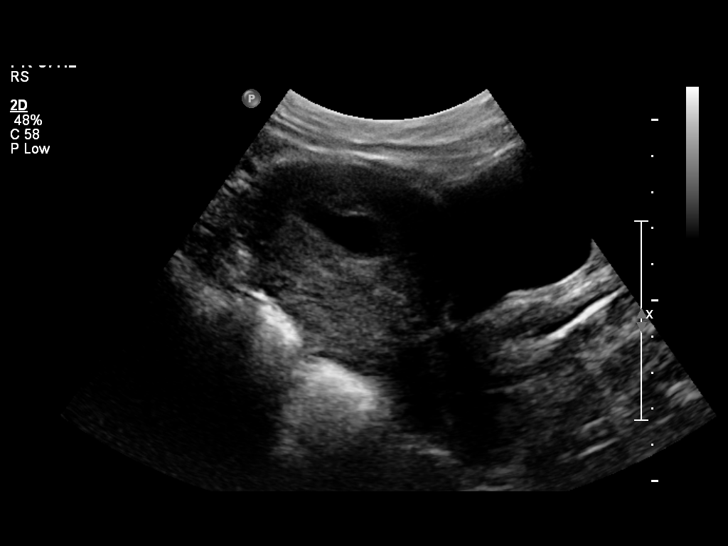
[im 5/43]
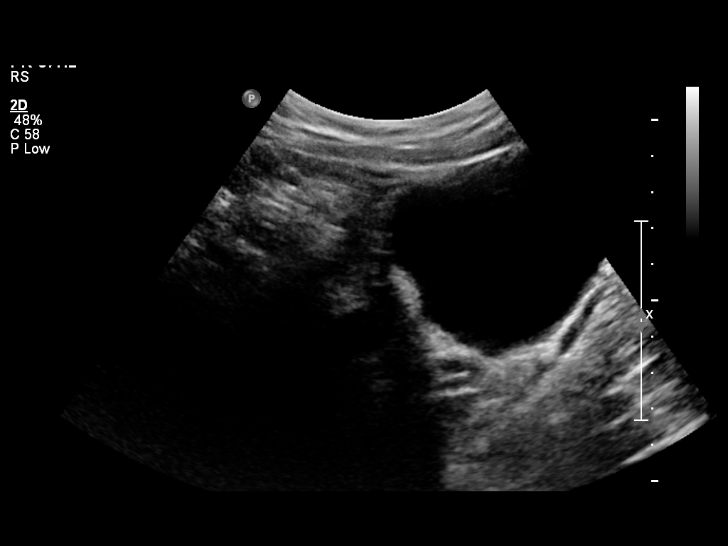
[im 8/43]
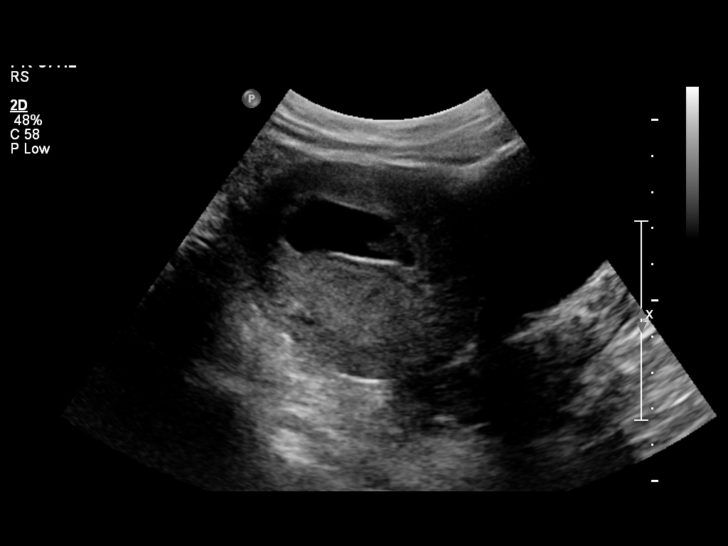
[im 11/43]
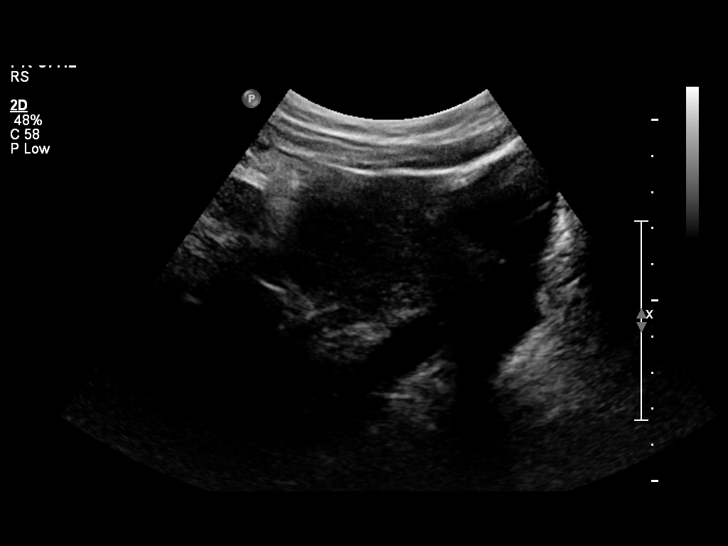
[im 15/43]
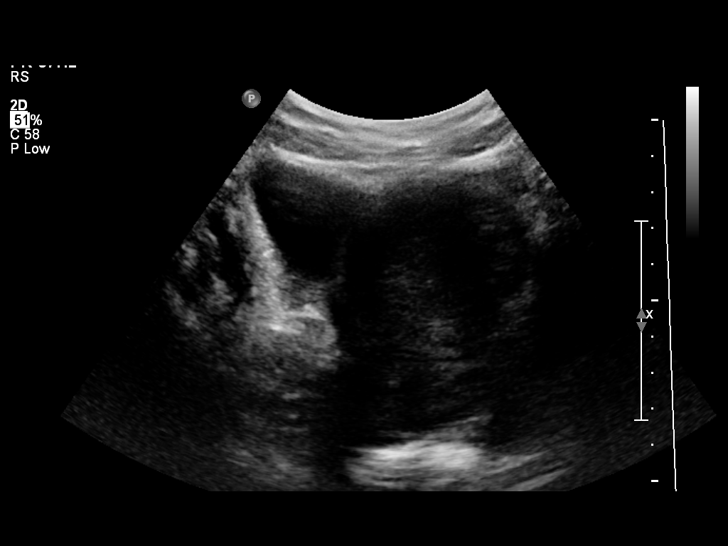
[im 18/43]
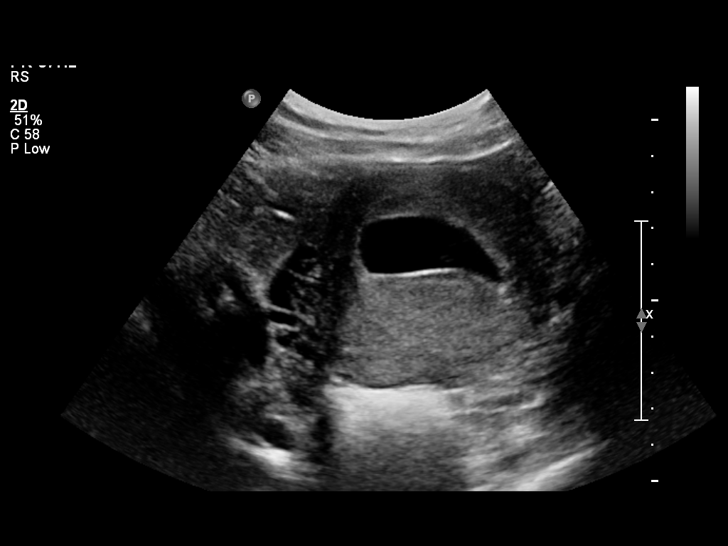
[im 21/43]
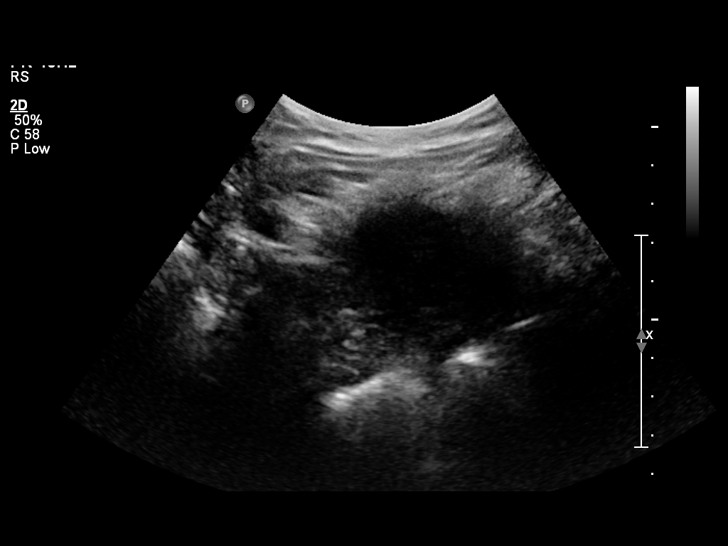
[im 24/43]
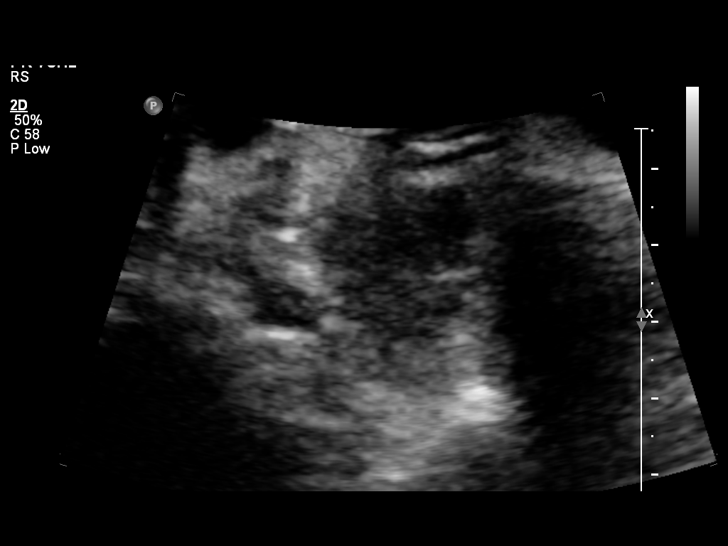
[im 27/43]
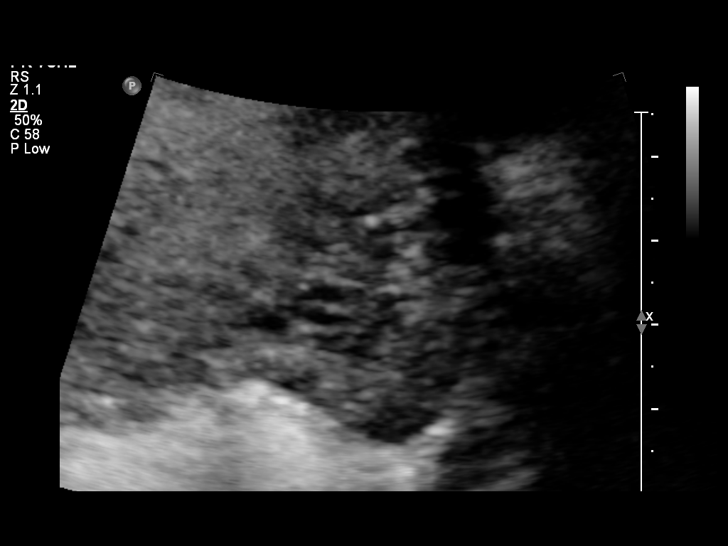
[im 30/43]
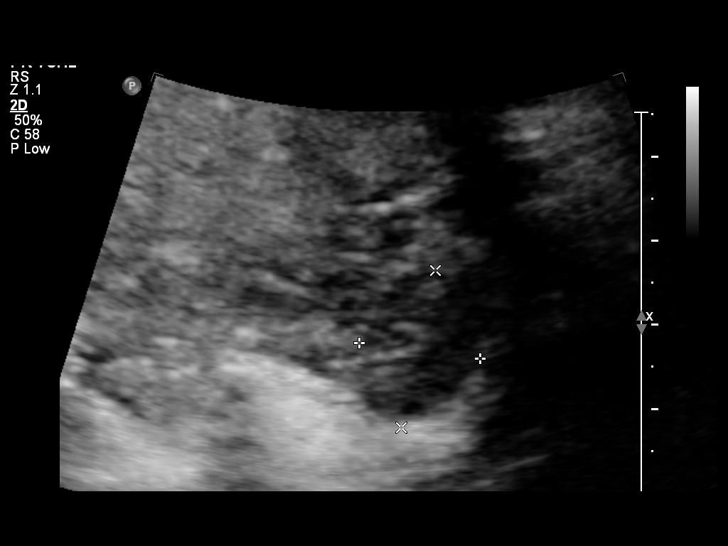
[im 33/43]
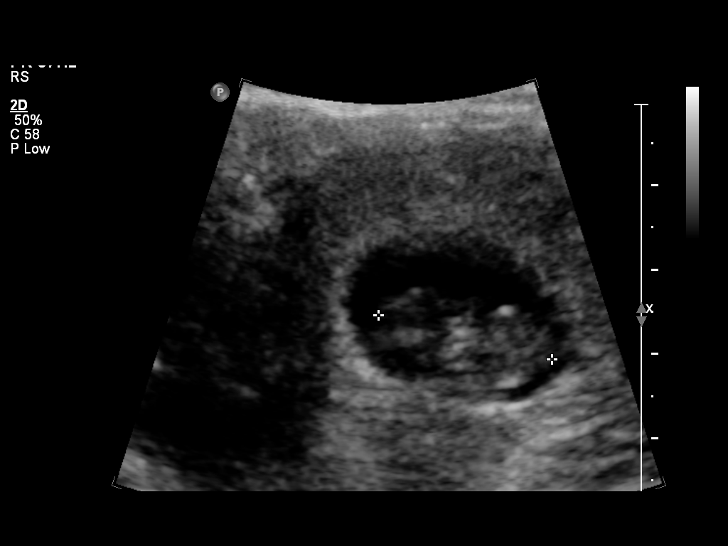
[im 36/43]
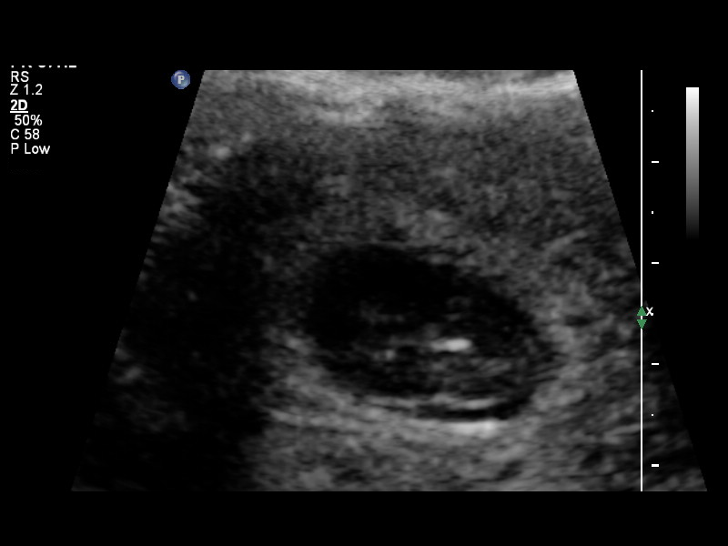
[im 39/43]
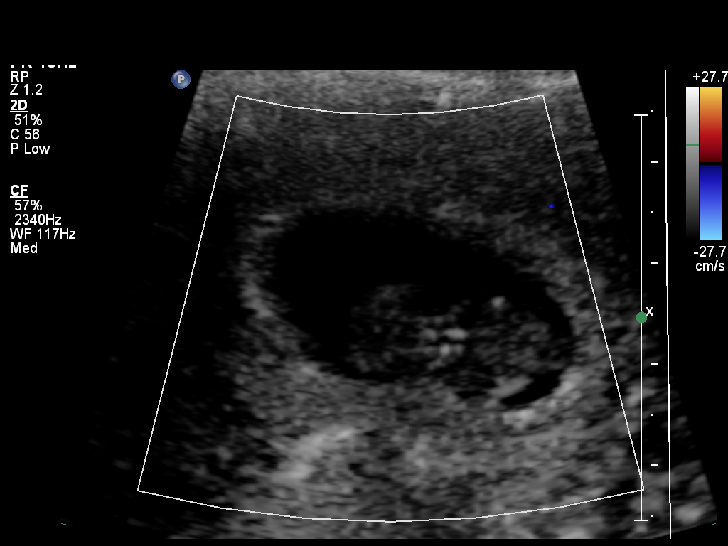
[im 43/43]
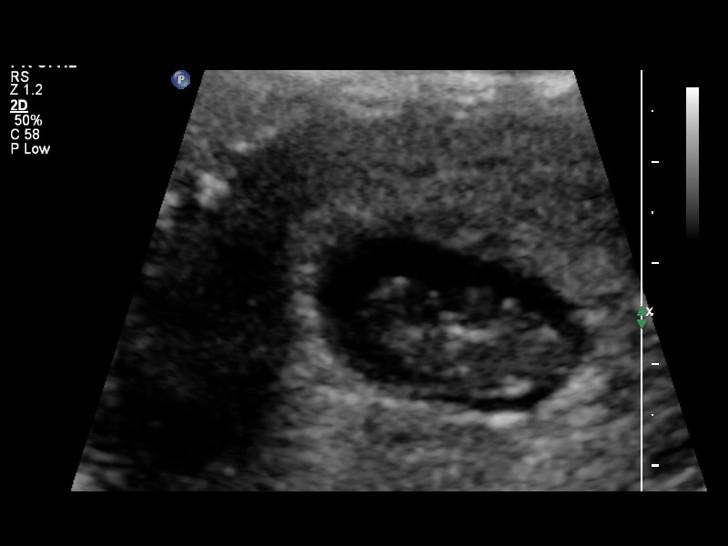

[14 of 28 positions shown; findings below may reference images not displayed]

FINDINGS: Intrauterine gestational sac: Present but appears abnormally
elongated

Yolk sac:  Not identified

Embryo:  Present

Cardiac Activity: None identified

CRL:   22  mm   8 w 6 d                  US EDC: 03/21/2014

Maternal uterus/adnexae: No subchorionic hemorrhage or free fluid.
Ovaries appear normal.
IMPRESSION: Although an intrauterine gestation is identified, the findings are
concerning for fetal demise.

## 2015-04-12 ENCOUNTER — Encounter: Payer: Medicaid Other | Admitting: Certified Nurse Midwife

## 2015-04-13 ENCOUNTER — Ambulatory Visit (INDEPENDENT_AMBULATORY_CARE_PROVIDER_SITE_OTHER): Payer: Medicaid Other | Admitting: Certified Nurse Midwife

## 2015-04-13 VITALS — BP 109/69 | HR 84 | Temp 97.8°F | Wt 140.0 lb

## 2015-04-13 DIAGNOSIS — Z8619 Personal history of other infectious and parasitic diseases: Secondary | ICD-10-CM

## 2015-04-13 DIAGNOSIS — Z3483 Encounter for supervision of other normal pregnancy, third trimester: Secondary | ICD-10-CM

## 2015-04-13 MED ORDER — VALACYCLOVIR HCL 500 MG PO TABS: 500.0000 mg | ORAL_TABLET | Freq: Two times a day (BID) | ORAL | Status: DC

## 2015-04-13 NOTE — Progress Notes (Signed)
Subjective:    Stacy Kelley is a 24 y.o. female being seen today for her obstetrical visit. She is at 7147w3d gestation. Patient reports backache, no bleeding, no contractions, no cramping, no leaking and lower pelvic pressure, states that she is wearing maternity support belt and that her work hours have been decreased to 5 hours/day. Fetal movement: normal.  Problem List Items Addressed This Visit    None    Visit Diagnoses    H/O herpes genitalis    -  Primary    Relevant Medications    valACYclovir (VALTREX) 500 MG tablet    Encounter for supervision of other normal pregnancy in third trimester        Relevant Orders    Strep B DNA probe      Patient Active Problem List   Diagnosis Date Noted  . Low back pain 11/16/2014   Objective:    BP 109/69 mmHg  Pulse 84  Temp(Src) 97.8 F (36.6 C)  Wt 140 lb (63.504 kg)  LMP 08/14/2014 FHT:  145 BPM  Uterine Size: size equals dates  Presentation: cephalic   Cervix:  1cm, anterior, 25% effaced, -3 station.    Assessment:    Pregnancy @ 1647w3d weeks   Doing well.   Plan:     labs reviewed, problem list updated Consent signed. GBS sent TDAP offered  Rhogam given for RH negative Pediatrician: discussed. Infant feeding: plans to breastfeed. Maternity leave: discussed. Cigarette smoking: never smoked. Orders Placed This Encounter  Procedures  . Strep B DNA probe   Meds ordered this encounter  Medications  . valACYclovir (VALTREX) 500 MG tablet    Sig: Take 1 tablet (500 mg total) by mouth 2 (two) times daily.    Dispense:  60 tablet    Refill:  4   Follow up in 1 Week.

## 2015-04-14 LAB — STREP B DNA PROBE: GBSP: NOT DETECTED

## 2015-04-19 ENCOUNTER — Ambulatory Visit (INDEPENDENT_AMBULATORY_CARE_PROVIDER_SITE_OTHER): Payer: Medicaid Other | Admitting: Obstetrics

## 2015-04-19 ENCOUNTER — Encounter: Payer: Self-pay | Admitting: Obstetrics

## 2015-04-19 VITALS — BP 109/70 | HR 78 | Temp 98.5°F | Wt 141.0 lb

## 2015-04-19 DIAGNOSIS — Z3483 Encounter for supervision of other normal pregnancy, third trimester: Secondary | ICD-10-CM | POA: Diagnosis not present

## 2015-04-19 LAB — POCT URINALYSIS DIPSTICK
BILIRUBIN UA: NEGATIVE
Glucose, UA: NEGATIVE
Ketones, UA: NEGATIVE
Nitrite, UA: NEGATIVE
Protein, UA: NEGATIVE
RBC UA: NEGATIVE
SPEC GRAV UA: 1.015
UROBILINOGEN UA: NEGATIVE
pH, UA: 5.5

## 2015-04-19 NOTE — Progress Notes (Signed)
Subjective:    Stacy Kelley is a 24 y.o. female being seen today for her obstetrical visit. She is at [redacted]w[redacted]d gestation. Patient reports heartburn. Fetal movement: normal.  Problem List Items Addressed This Visit    None    Visit Diagnoses    Encounter for supervision of other normal pregnancy in third trimester    -  Primary    Relevant Orders    POCT urinalysis dipstick (Completed)      Patient Active Problem List   Diagnosis Date Noted  . Low back pain 11/16/2014   Objective:    BP 109/70 mmHg  Pulse 78  Temp(Src) 98.5 F (36.9 C)  Wt 141 lb (63.957 kg)  LMP 08/14/2014 FHT:  150 BPM  Uterine Size: size equals dates  Presentation: cephalic     Assessment:    Pregnancy @ [redacted]w[redacted]d weeks   Plan:     labs reviewed, problem list updated Consent signed. GBS sent TDAP offered  Rhogam given for RH negative Pediatrician: discussed. Infant feeding: plans to breastfeed. Maternity leave: discussed. Cigarette smoking: never smoked. Orders Placed This Encounter  Procedures  . POCT urinalysis dipstick   No orders of the defined types were placed in this encounter.   Follow up in 1 Week.

## 2015-04-26 ENCOUNTER — Encounter: Payer: Self-pay | Admitting: Obstetrics

## 2015-04-26 ENCOUNTER — Ambulatory Visit (INDEPENDENT_AMBULATORY_CARE_PROVIDER_SITE_OTHER): Payer: Medicaid Other | Admitting: Obstetrics

## 2015-04-26 VITALS — BP 113/67 | HR 76 | Temp 98.1°F | Wt 144.0 lb

## 2015-04-26 DIAGNOSIS — Z3483 Encounter for supervision of other normal pregnancy, third trimester: Secondary | ICD-10-CM

## 2015-04-26 LAB — POCT URINALYSIS DIPSTICK
BILIRUBIN UA: NEGATIVE
Glucose, UA: NEGATIVE
KETONES UA: NEGATIVE
Leukocytes, UA: NEGATIVE
NITRITE UA: NEGATIVE
Protein, UA: NEGATIVE
RBC UA: NEGATIVE
Spec Grav, UA: 1.015
UROBILINOGEN UA: NEGATIVE
pH, UA: 7

## 2015-04-26 NOTE — Progress Notes (Signed)
Subjective:    Stacy Kelley is a 24 y.o. female being seen today for her obstetrical visit. She is at [redacted]w[redacted]d gestation. Patient reports no complaints. Fetal movement: normal.  Problem List Items Addressed This Visit    None    Visit Diagnoses    Encounter for supervision of other normal pregnancy in third trimester    -  Primary    Relevant Orders    POCT urinalysis dipstick      Patient Active Problem List   Diagnosis Date Noted  . Low back pain 11/16/2014    Objective:    BP 113/67 mmHg  Pulse 76  Temp(Src) 98.1 F (36.7 C)  Wt 144 lb (65.318 kg)  LMP 08/14/2014 FHT: 150 BPM  Uterine Size: size equals dates  Presentations: unsure    Assessment:    Pregnancy @ [redacted]w[redacted]d weeks   Plan:   Plans for delivery: Vaginal anticipated; labs reviewed; problem list updated Counseling: Consent signed. Infant feeding: plans to breastfeed. Cigarette smoking: never smoked. L&D discussion: symptoms of labor, discussed when to call, discussed what number to call, anesthetic/analgesic options reviewed and delivering clinician:  plans no preference. Postpartum supports and preparation: circumcision discussed and contraception plans discussed.  Follow up in 1 Week.

## 2015-05-03 ENCOUNTER — Ambulatory Visit (INDEPENDENT_AMBULATORY_CARE_PROVIDER_SITE_OTHER): Payer: Medicaid Other | Admitting: Certified Nurse Midwife

## 2015-05-03 VITALS — BP 111/80 | HR 77 | Temp 98.4°F | Wt 147.0 lb

## 2015-05-03 DIAGNOSIS — Z3483 Encounter for supervision of other normal pregnancy, third trimester: Secondary | ICD-10-CM

## 2015-05-03 NOTE — Progress Notes (Signed)
Subjective:    Stacy Kelley is a 24 y.o. female being seen today for her obstetrical visit. She is at [redacted]w[redacted]d gestation. Patient reports backache, no bleeding, no contractions, no cramping and no leaking. Fetal movement: normal.  Problem List Items Addressed This Visit    None    Visit Diagnoses    Encounter for supervision of other normal pregnancy in third trimester    -  Primary    Relevant Orders    POCT urinalysis dipstick      Patient Active Problem List   Diagnosis Date Noted  . Low back pain 11/16/2014    Objective:    BP 111/80 mmHg  Pulse 77  Temp(Src) 98.4 F (36.9 C)  Wt 147 lb (66.679 kg)  LMP 08/14/2014 FHT: 135 BPM  Uterine Size: size equals dates  Presentations: cephalic  Pelvic Exam:              Dilation: 2cm       Effacement: 50%             Station:  -3    Consistency: soft            Position: anterior     Assessment:    Pregnancy @ [redacted]w[redacted]d weeks   Plan:   Plans for delivery: Vaginal anticipated; labs reviewed; problem list updated Counseling: Consent signed. Infant feeding: plans to breastfeed. Cigarette smoking: never smoked. L&D discussion: symptoms of labor, discussed when to call, discussed what number to call, anesthetic/analgesic options reviewed and delivering clinician:  plans no preference. Postpartum supports and preparation: circumcision discussed and contraception plans discussed.  Follow up in 1 Week.

## 2015-05-10 ENCOUNTER — Inpatient Hospital Stay (HOSPITAL_COMMUNITY)
Admission: AD | Admit: 2015-05-10 | Discharge: 2015-05-10 | Disposition: A | Discharge status: Home / Self Care | Payer: Medicaid Other | Source: Ambulatory Visit | Attending: Obstetrics | Admitting: Obstetrics

## 2015-05-10 ENCOUNTER — Ambulatory Visit: Payer: Medicaid Other | Admitting: Certified Nurse Midwife

## 2015-05-10 ENCOUNTER — Encounter (HOSPITAL_COMMUNITY): Payer: Self-pay | Admitting: *Deleted

## 2015-05-10 VITALS — BP 123/68 | HR 86 | Temp 97.7°F | Wt 147.0 lb

## 2015-05-10 DIAGNOSIS — Z3493 Encounter for supervision of normal pregnancy, unspecified, third trimester: Secondary | ICD-10-CM | POA: Insufficient documentation

## 2015-05-10 DIAGNOSIS — Z3483 Encounter for supervision of other normal pregnancy, third trimester: Secondary | ICD-10-CM

## 2015-05-10 LAB — POCT URINALYSIS DIPSTICK
Bilirubin, UA: NEGATIVE
Blood, UA: NEGATIVE
Glucose, UA: NORMAL
Ketones, UA: NEGATIVE
LEUKOCYTES UA: NEGATIVE
Nitrite, UA: NEGATIVE
Protein, UA: NEGATIVE
SPEC GRAV UA: 1.015
UROBILINOGEN UA: NEGATIVE
pH, UA: 6

## 2015-05-10 NOTE — MAU Note (Signed)
Pt went to regular prenatal appointment and stated she was having contractions and bloody show.  Pt states they told her she was 3 cm dilated.  Pt states she was sent to the hospital for monitoring and a labor evaluation.  Pt states there was no leaking just bloody show.  Pt states she is feeling the baby move.

## 2015-05-10 NOTE — Progress Notes (Signed)
Subjective:    Stacy Kelley is a 24 y.o. female being seen today for her obstetrical visit. She is at [redacted]w[redacted]d gestation. Patient reports backache, no leaking and contractions about every 2 minutes for several hours. Fetal movement: normal.  Problem List Items Addressed This Visit    None    Visit Diagnoses    Encounter for supervision of other normal pregnancy in third trimester    -  Primary    Relevant Orders    POCT urinalysis dipstick (Completed)      Patient Active Problem List   Diagnosis Date Noted  . Low back pain 11/16/2014    Objective:    BP 123/68 mmHg  Pulse 86  Temp(Src) 97.7 F (36.5 C)  Wt 147 lb (66.679 kg)  LMP 08/14/2014 FHT: 135 BPM  Uterine Size: size equals dates  Presentations: cephalic  Pelvic Exam:              Dilation: 3cm       Effacement: 50%             Station:  -3    Consistency: soft            Position: middle     Assessment:    Pregnancy @ [redacted]w[redacted]d weeks   ?early labor sent to [facility]U  Plan:   Plans for delivery: Vaginal anticipated; labs reviewed; problem list updated Counseling: Consent signed. Infant feeding: plans to breastfeed. Cigarette smoking: never smoked. L&D discussion: symptoms of labor, discussed when to call, discussed what number to call, anesthetic/analgesic options reviewed and delivering clinician:  plans Certified Nurse-Midwife. Postpartum supports and preparation: circumcision discussed and contraception plans discussed.  Follow up in 1 Week.

## 2015-05-10 NOTE — Discharge Instructions (Signed)
Third Trimester of Pregnancy °The third trimester is from week 29 through week 42, months 7 through 9. The third trimester is a time when the fetus is growing rapidly. At the end of the ninth month, the fetus is about 20 inches in length and weighs 6-10 pounds.  °BODY CHANGES °Your body goes through many changes during pregnancy. The changes vary from woman to woman.  °· Your weight will continue to increase. You can expect to gain 25-35 pounds (11-16 kg) by the end of the pregnancy. °· You may begin to get stretch marks on your hips, abdomen, and breasts. °· You may urinate more often because the fetus is moving lower into your pelvis and pressing on your bladder. °· You may develop or continue to have heartburn as a result of your pregnancy. °· You may develop constipation because certain hormones are causing the muscles that push waste through your intestines to slow down. °· You may develop hemorrhoids or swollen, bulging veins (varicose veins). °· You may have pelvic pain because of the weight gain and pregnancy hormones relaxing your joints between the bones in your pelvis. Backaches may result from overexertion of the muscles supporting your posture. °· You may have changes in your hair. These can include thickening of your hair, rapid growth, and changes in texture. Some women also have hair loss during or after pregnancy, or hair that feels dry or thin. Your hair will most likely return to normal after your baby is born. °· Your breasts will continue to grow and be tender. A yellow discharge may leak from your breasts called colostrum. °· Your belly button may stick out. °· You may feel short of breath because of your expanding uterus. °· You may notice the fetus "dropping," or moving lower in your abdomen. °· You may have a bloody mucus discharge. This usually occurs a few days to a week before labor begins. °· Your cervix becomes thin and soft (effaced) near your due date. °WHAT TO EXPECT AT YOUR PRENATAL  EXAMS  °You will have prenatal exams every 2 weeks until week 36. Then, you will have weekly prenatal exams. During a routine prenatal visit: °· You will be weighed to make sure you and the fetus are growing normally. °· Your blood pressure is taken. °· Your abdomen will be measured to track your baby's growth. °· The fetal heartbeat will be listened to. °· Any test results from the previous visit will be discussed. °· You may have a cervical check near your due date to see if you have effaced. °At around 36 weeks, your caregiver will check your cervix. At the same time, your caregiver will also perform a test on the secretions of the vaginal tissue. This test is to determine if a type of bacteria, Group B streptococcus, is present. Your caregiver will explain this further. °Your caregiver may ask you: °· What your birth plan is. °· How you are feeling. °· If you are feeling the baby move. °· If you have had any abnormal symptoms, such as leaking fluid, bleeding, severe headaches, or abdominal cramping. °· If you have any questions. °Other tests or screenings that may be performed during your third trimester include: °· Blood tests that check for low iron levels (anemia). °· Fetal testing to check the health, activity level, and growth of the fetus. Testing is done if you have certain medical conditions or if there are problems during the pregnancy. °FALSE LABOR °You may feel small, irregular contractions that   eventually go away. These are called Braxton Hicks contractions, or false labor. Contractions may last for hours, days, or even weeks before true labor sets in. If contractions come at regular intervals, intensify, or become painful, it is best to be seen by your caregiver.  °SIGNS OF LABOR  °· Menstrual-like cramps. °· Contractions that are 5 minutes apart or less. °· Contractions that start on the top of the uterus and spread down to the lower abdomen and back. °· A sense of increased pelvic pressure or back  pain. °· A watery or bloody mucus discharge that comes from the vagina. °If you have any of these signs before the 37th week of pregnancy, call your caregiver right away. You need to go to the hospital to get checked immediately. °HOME CARE INSTRUCTIONS  °· Avoid all smoking, herbs, alcohol, and unprescribed drugs. These chemicals affect the formation and growth of the baby. °· Follow your caregiver's instructions regarding medicine use. There are medicines that are either safe or unsafe to take during pregnancy. °· Exercise only as directed by your caregiver. Experiencing uterine cramps is a good sign to stop exercising. °· Continue to eat regular, healthy meals. °· Wear a good support bra for breast tenderness. °· Do not use hot tubs, steam rooms, or saunas. °· Wear your seat belt at all times when driving. °· Avoid raw meat, uncooked cheese, cat litter boxes, and soil used by cats. These carry germs that can cause birth defects in the baby. °· Take your prenatal vitamins. °· Try taking a stool softener (if your caregiver approves) if you develop constipation. Eat more high-fiber foods, such as fresh vegetables or fruit and whole grains. Drink plenty of fluids to keep your urine clear or pale yellow. °· Take warm sitz baths to soothe any pain or discomfort caused by hemorrhoids. Use hemorrhoid cream if your caregiver approves. °· If you develop varicose veins, wear support hose. Elevate your feet for 15 minutes, 3-4 times a day. Limit salt in your diet. °· Avoid heavy lifting, wear low heal shoes, and practice good posture. °· Rest a lot with your legs elevated if you have leg cramps or low back pain. °· Visit your dentist if you have not gone during your pregnancy. Use a soft toothbrush to brush your teeth and be gentle when you floss. °· A sexual relationship may be continued unless your caregiver directs you otherwise. °· Do not travel far distances unless it is absolutely necessary and only with the approval  of your caregiver. °· Take prenatal classes to understand, practice, and ask questions about the labor and delivery. °· Make a trial run to the hospital. °· Pack your hospital bag. °· Prepare the baby's nursery. °· Continue to go to all your prenatal visits as directed by your caregiver. °SEEK MEDICAL CARE IF: °· You are unsure if you are in labor or if your water has broken. °· You have dizziness. °· You have mild pelvic cramps, pelvic pressure, or nagging pain in your abdominal area. °· You have persistent nausea, vomiting, or diarrhea. °· You have a bad smelling vaginal discharge. °· You have pain with urination. °SEEK IMMEDIATE MEDICAL CARE IF:  °· You have a fever. °· You are leaking fluid from your vagina. °· You have spotting or bleeding from your vagina. °· You have severe abdominal cramping or pain. °· You have rapid weight loss or gain. °· You have shortness of breath with chest pain. °· You notice sudden or extreme swelling   of your face, hands, ankles, feet, or legs. °· You have not felt your baby move in over an hour. °· You have severe headaches that do not go away with medicine. °· You have vision changes. °Document Released: 09/09/2001 Document Revised: 09/20/2013 Document Reviewed: 11/16/2012 °[employer]® Patient Information ©2015 [employer]. This information is not intended to replace advice given to you by your health care provider. Make sure you discuss any questions you have with your health care provider. °Fetal Movement Counts °Patient Name: __________________________________________________ Patient Due Date: ____________________ °Performing a fetal movement count is highly recommended in high-risk pregnancies, but it is good for every pregnant woman to do. Your health care provider may ask you to start counting fetal movements at 28 weeks of the pregnancy. Fetal movements often increase: °· After eating a full meal. °· After physical activity. °· After eating or drinking something sweet or  cold. °· At rest. °Pay attention to when you feel the baby is most active. This will help you notice a pattern of your baby's sleep and wake cycles and what factors contribute to an increase in fetal movement. It is important to perform a fetal movement count at the same time each day when your baby is normally most active.  °HOW TO COUNT FETAL MOVEMENTS °· Find a quiet and comfortable area to sit or lie down on your left side. Lying on your left side provides the best blood and oxygen circulation to your baby. °· Write down the day and time on a sheet of paper or in a journal. °· Start counting kicks, flutters, swishes, rolls, or jabs in a 2-hour period. You should feel at least 10 movements within 2 hours. °· If you do not feel 10 movements in 2 hours, wait 2-3 hours and count again. Look for a change in the pattern or not enough counts in 2 hours. °SEEK MEDICAL CARE IF: °· You feel less than 10 counts in 2 hours, tried twice. °· There is no movement in over an hour. °· The pattern is changing or taking longer each day to reach 10 counts in 2 hours. °· You feel the baby is not moving as he or she usually does. °Date: ____________ Movements: ____________ Start time: ____________ Finish time: ____________  °Date: ____________ Movements: ____________ Start time: ____________ Finish time: ____________ °Date: ____________ Movements: ____________ Start time: ____________ Finish time: ____________ °Date: ____________ Movements: ____________ Start time: ____________ Finish time: ____________ °Date: ____________ Movements: ____________ Start time: ____________ Finish time: ____________ °Date: ____________ Movements: ____________ Start time: ____________ Finish time: ____________ °Date: ____________ Movements: ____________ Start time: ____________ Finish time: ____________ °Date: ____________ Movements: ____________ Start time: ____________ Finish time: ____________  °Date: ____________ Movements: ____________ Start time:  ____________ Finish time: ____________ °Date: ____________ Movements: ____________ Start time: ____________ Finish time: ____________ °Date: ____________ Movements: ____________ Start time: ____________ Finish time: ____________ °Date: ____________ Movements: ____________ Start time: ____________ Finish time: ____________ °Date: ____________ Movements: ____________ Start time: ____________ Finish time: ____________ °Date: ____________ Movements: ____________ Start time: ____________ Finish time: ____________ °Date: ____________ Movements: ____________ Start time: ____________ Finish time: ____________  °Date: ____________ Movements: ____________ Start time: ____________ Finish time: ____________ °Date: ____________ Movements: ____________ Start time: ____________ Finish time: ____________ °Date: ____________ Movements: ____________ Start time: ____________ Finish time: ____________ °Date: ____________ Movements: ____________ Start time: ____________ Finish time: ____________ °Date: ____________ Movements: ____________ Start time: ____________ Finish time: ____________ °Date: ____________ Movements: ____________ Start time: ____________ Finish time: ____________ °Date: ____________ Movements: ____________ Start time: ____________ Finish time:   ____________  °Date: ____________ Movements: ____________ Start time: ____________ Finish time: ____________ °Date: ____________ Movements: ____________ Start time: ____________ Finish time: ____________ °Date: ____________ Movements: ____________ Start time: ____________ Finish time: ____________ °Date: ____________ Movements: ____________ Start time: ____________ Finish time: ____________ °Date: ____________ Movements: ____________ Start time: ____________ Finish time: ____________ °Date: ____________ Movements: ____________ Start time: ____________ Finish time: ____________ °Date: ____________ Movements: ____________ Start time: ____________ Finish time: ____________  °Date:  ____________ Movements: ____________ Start time: ____________ Finish time: ____________ °Date: ____________ Movements: ____________ Start time: ____________ Finish time: ____________ °Date: ____________ Movements: ____________ Start time: ____________ Finish time: ____________ °Date: ____________ Movements: ____________ Start time: ____________ Finish time: ____________ °Date: ____________ Movements: ____________ Start time: ____________ Finish time: ____________ °Date: ____________ Movements: ____________ Start time: ____________ Finish time: ____________ °Date: ____________ Movements: ____________ Start time: ____________ Finish time: ____________  °Date: ____________ Movements: ____________ Start time: ____________ Finish time: ____________ °Date: ____________ Movements: ____________ Start time: ____________ Finish time: ____________ °Date: ____________ Movements: ____________ Start time: ____________ Finish time: ____________ °Date: ____________ Movements: ____________ Start time: ____________ Finish time: ____________ °Date: ____________ Movements: ____________ Start time: ____________ Finish time: ____________ °Date: ____________ Movements: ____________ Start time: ____________ Finish time: ____________ °Date: ____________ Movements: ____________ Start time: ____________ Finish time: ____________  °Date: ____________ Movements: ____________ Start time: ____________ Finish time: ____________ °Date: ____________ Movements: ____________ Start time: ____________ Finish time: ____________ °Date: ____________ Movements: ____________ Start time: ____________ Finish time: ____________ °Date: ____________ Movements: ____________ Start time: ____________ Finish time: ____________ °Date: ____________ Movements: ____________ Start time: ____________ Finish time: ____________ °Date: ____________ Movements: ____________ Start time: ____________ Finish time: ____________ °Date: ____________ Movements: ____________ Start  time: ____________ Finish time: ____________  °Date: ____________ Movements: ____________ Start time: ____________ Finish time: ____________ °Date: ____________ Movements: ____________ Start time: ____________ Finish time: ____________ °Date: ____________ Movements: ____________ Start time: ____________ Finish time: ____________ °Date: ____________ Movements: ____________ Start time: ____________ Finish time: ____________ °Date: ____________ Movements: ____________ Start time: ____________ Finish time: ____________ °Date: ____________ Movements: ____________ Start time: ____________ Finish time: ____________ °Document Released: 10/15/2006 Document Revised: 01/30/2014 Document Reviewed: 07/12/2012 °[employer]® Patient Information ©2015 [employer]. This information is not intended to replace advice given to you by your health care provider. Make sure you discuss any questions you have with your health care provider. °Braxton Hicks Contractions °Contractions of the uterus can occur throughout pregnancy. Contractions are not always a sign that you are in labor.  °WHAT ARE BRAXTON HICKS CONTRACTIONS?  °Contractions that occur before labor are called Braxton Hicks contractions, or false labor. Toward the end of pregnancy (32-34 weeks), these contractions can develop more often and may become more forceful. This is not true labor because these contractions do not result in opening (dilatation) and thinning of the cervix. They are sometimes difficult to tell apart from true labor because these contractions can be forceful and people have different pain tolerances. You should not feel embarrassed if you go to the hospital with false labor. Sometimes, the only way to tell if you are in true labor is for your health care provider to look for changes in the cervix. °If there are no prenatal problems or other health problems associated with the pregnancy, it is completely safe to be sent home with false labor and await the  onset of true labor. °HOW CAN YOU TELL THE DIFFERENCE BETWEEN TRUE AND FALSE LABOR? °False Labor °· The contractions of false labor are usually shorter and not as hard as those of true labor.   °· The contractions   are usually irregular.   °· The contractions are often felt in the front of the lower abdomen and in the groin.   °· The contractions may go away when you walk around or change positions while lying down.   °· The contractions get weaker and are shorter lasting as time goes on.   °· The contractions do not usually become progressively stronger, regular, and closer together as with true labor.   °True Labor °· Contractions in true labor last 30-70 seconds, become very regular, usually become more intense, and increase in frequency.   °· The contractions do not go away with walking.   °· The discomfort is usually felt in the top of the uterus and spreads to the lower abdomen and low back.   °· True labor can be determined by your health care provider with an exam. This will show that the cervix is dilating and getting thinner.   °WHAT TO REMEMBER °· Keep up with your usual exercises and follow other instructions given by your health care provider.   °· Take medicines as directed by your health care provider.   °· Keep your regular prenatal appointments.   °· Eat and drink lightly if you think you are going into labor.   °· If Braxton Hicks contractions are making you uncomfortable:   °· Change your position from lying down or resting to walking, or from walking to resting.   °· Sit and rest in a tub of warm water.   °· Drink 2-3 glasses of water. Dehydration may cause these contractions.   °· Do slow and deep breathing several times an hour.   °WHEN SHOULD I SEEK IMMEDIATE MEDICAL CARE? °Seek immediate medical care if: °· Your contractions become stronger, more regular, and closer together.   °· You have fluid leaking or gushing from your vagina.   °· You have a fever.   °· You pass blood-tinged mucus.    °· You have vaginal bleeding.   °· You have continuous abdominal pain.   °· You have low back pain that you never had before.   °· You feel your baby's head pushing down and causing pelvic pressure.   °· Your baby is not moving as much as it used to.   °Document Released: 09/15/2005 Document Revised: 09/20/2013 Document Reviewed: 06/27/2013 °[employer]® Patient Information ©2015 [employer]. This information is not intended to replace advice given to you by your health care provider. Make sure you discuss any questions you have with your health care provider. ° °

## 2015-05-11 ENCOUNTER — Inpatient Hospital Stay (HOSPITAL_COMMUNITY)
Admission: AD | Admit: 2015-05-11 | Discharge: 2015-05-13 | DRG: 775 | Disposition: A | Discharge status: Home / Self Care | Payer: Medicaid Other | Source: Ambulatory Visit | Attending: Obstetrics | Admitting: Obstetrics

## 2015-05-11 ENCOUNTER — Encounter (HOSPITAL_COMMUNITY): Payer: Self-pay | Admitting: *Deleted

## 2015-05-11 DIAGNOSIS — Z8249 Family history of ischemic heart disease and other diseases of the circulatory system: Secondary | ICD-10-CM | POA: Diagnosis not present

## 2015-05-11 DIAGNOSIS — Z833 Family history of diabetes mellitus: Secondary | ICD-10-CM | POA: Diagnosis not present

## 2015-05-11 DIAGNOSIS — O3421 Maternal care for scar from previous cesarean delivery: Secondary | ICD-10-CM | POA: Diagnosis present

## 2015-05-11 DIAGNOSIS — Z3A39 39 weeks gestation of pregnancy: Secondary | ICD-10-CM | POA: Diagnosis present

## 2015-05-11 DIAGNOSIS — IMO0002 Reserved for concepts with insufficient information to code with codable children: Secondary | ICD-10-CM | POA: Diagnosis present

## 2015-05-11 LAB — CBC
HCT: 39.9 % (ref 36.0–46.0)
HEMOGLOBIN: 13.4 g/dL (ref 12.0–15.0)
MCH: 29.2 pg (ref 26.0–34.0)
MCHC: 33.6 g/dL (ref 30.0–36.0)
MCV: 86.9 fL (ref 78.0–100.0)
PLATELETS: 181 10*3/uL (ref 150–400)
RBC: 4.59 MIL/uL (ref 3.87–5.11)
RDW: 15.1 % (ref 11.5–15.5)
WBC: 18.6 10*3/uL — ABNORMAL HIGH (ref 4.0–10.5)

## 2015-05-11 LAB — TYPE AND SCREEN
ABO/RH(D): O POS
Antibody Screen: NEGATIVE

## 2015-05-11 MED ORDER — LACTATED RINGERS IV BOLUS (SEPSIS)
1000.0000 mL | Freq: Once | INTRAVENOUS | Status: AC
  Administered 2015-05-11: 1000 mL via INTRAVENOUS

## 2015-05-11 MED ORDER — ZOLPIDEM TARTRATE 5 MG PO TABS: 5.0000 mg | ORAL_TABLET | Freq: Every evening | ORAL | Status: DC | PRN

## 2015-05-11 MED ORDER — ACETAMINOPHEN 325 MG PO TABS: 650.0000 mg | ORAL_TABLET | ORAL | Status: DC | PRN

## 2015-05-11 MED ORDER — FENTANYL CITRATE (PF) 100 MCG/2ML IJ SOLN
INTRAMUSCULAR | Status: AC
  Administered 2015-05-11: 100 ug
  Filled 2015-05-11: qty 2

## 2015-05-11 MED ORDER — TETANUS-DIPHTH-ACELL PERTUSSIS 5-2.5-18.5 LF-MCG/0.5 IM SUSP: 0.5000 mL | Freq: Once | INTRAMUSCULAR | Status: DC

## 2015-05-11 MED ORDER — OXYTOCIN BOLUS FROM INFUSION: 500.0000 mL | INTRAVENOUS | Status: DC

## 2015-05-11 MED ORDER — SIMETHICONE 80 MG PO CHEW
80.0000 mg | CHEWABLE_TABLET | ORAL | Status: DC | PRN
Start: 2015-05-11 — End: 2015-05-13

## 2015-05-11 MED ORDER — OXYTOCIN 40 UNITS IN LACTATED RINGERS INFUSION - SIMPLE MED: 62.5000 mL/h | INTRAVENOUS | Status: DC | PRN

## 2015-05-11 MED ORDER — DIBUCAINE 1 % RE OINT: 1.0000 "application " | TOPICAL_OINTMENT | RECTAL | Status: DC | PRN

## 2015-05-11 MED ORDER — IBUPROFEN 600 MG PO TABS
600.0000 mg | ORAL_TABLET | Freq: Four times a day (QID) | ORAL | Status: DC
  Administered 2015-05-11 – 2015-05-13 (×6): 600 mg via ORAL
  Filled 2015-05-11 (×8): qty 1

## 2015-05-11 MED ORDER — LANOLIN HYDROUS EX OINT
TOPICAL_OINTMENT | CUTANEOUS | Status: DC | PRN
Start: 2015-05-11 — End: 2015-05-13

## 2015-05-11 MED ORDER — OXYCODONE-ACETAMINOPHEN 5-325 MG PO TABS: 2.0000 | ORAL_TABLET | ORAL | Status: DC | PRN

## 2015-05-11 MED ORDER — CITRIC ACID-SODIUM CITRATE 334-500 MG/5ML PO SOLN: 30.0000 mL | ORAL | Status: DC | PRN

## 2015-05-11 MED ORDER — LACTATED RINGERS IV SOLN: INTRAVENOUS | Status: DC

## 2015-05-11 MED ORDER — SENNOSIDES-DOCUSATE SODIUM 8.6-50 MG PO TABS
2.0000 | ORAL_TABLET | ORAL | Status: DC
  Administered 2015-05-12: 2 via ORAL
  Filled 2015-05-11: qty 2

## 2015-05-11 MED ORDER — LIDOCAINE HCL (PF) 1 % IJ SOLN
INTRAMUSCULAR | Status: AC
  Filled 2015-05-11: qty 30

## 2015-05-11 MED ORDER — IBUPROFEN 600 MG PO TABS
600.0000 mg | ORAL_TABLET | Freq: Once | ORAL | Status: AC
  Administered 2015-05-11: 600 mg via ORAL
  Filled 2015-05-11: qty 1

## 2015-05-11 MED ORDER — LACTATED RINGERS IV SOLN
500.0000 mL | INTRAVENOUS | Status: DC | PRN
Start: 2015-05-11 — End: 2015-05-11

## 2015-05-11 MED ORDER — FLEET ENEMA 7-19 GM/118ML RE ENEM: 1.0000 | ENEMA | RECTAL | Status: DC | PRN

## 2015-05-11 MED ORDER — FENTANYL CITRATE (PF) 100 MCG/2ML IJ SOLN: 100.0000 ug | INTRAMUSCULAR | Status: DC | PRN

## 2015-05-11 MED ORDER — PRENATAL MULTIVITAMIN CH: 1.0000 | ORAL_TABLET | Freq: Every day | ORAL | Status: DC

## 2015-05-11 MED ORDER — OXYCODONE-ACETAMINOPHEN 5-325 MG PO TABS: 1.0000 | ORAL_TABLET | ORAL | Status: DC | PRN

## 2015-05-11 MED ORDER — OXYTOCIN 40 UNITS IN LACTATED RINGERS INFUSION - SIMPLE MED
INTRAVENOUS | Status: AC
Start: 2015-05-11 — End: 2015-05-12
  Filled 2015-05-11: qty 1000

## 2015-05-11 MED ORDER — ONDANSETRON HCL 4 MG PO TABS: 4.0000 mg | ORAL_TABLET | ORAL | Status: DC | PRN

## 2015-05-11 MED ORDER — OB COMPLETE PETITE 35-5-1-200 MG PO CAPS: 1.0000 | ORAL_CAPSULE | Freq: Every day | ORAL | Status: DC

## 2015-05-11 MED ORDER — ONDANSETRON HCL 4 MG/2ML IJ SOLN: 4.0000 mg | INTRAMUSCULAR | Status: DC | PRN

## 2015-05-11 MED ORDER — DIPHENHYDRAMINE HCL 25 MG PO CAPS: 25.0000 mg | ORAL_CAPSULE | Freq: Four times a day (QID) | ORAL | Status: DC | PRN

## 2015-05-11 MED ORDER — OXYTOCIN 40 UNITS IN LACTATED RINGERS INFUSION - SIMPLE MED: 62.5000 mL/h | INTRAVENOUS | Status: DC

## 2015-05-11 MED ORDER — BENZOCAINE-MENTHOL 20-0.5 % EX AERO: 1.0000 "application " | INHALATION_SPRAY | CUTANEOUS | Status: DC | PRN

## 2015-05-11 MED ORDER — ONDANSETRON HCL 4 MG/2ML IJ SOLN: 4.0000 mg | Freq: Four times a day (QID) | INTRAMUSCULAR | Status: DC | PRN

## 2015-05-11 MED ORDER — WITCH HAZEL-GLYCERIN EX PADS: 1.0000 "application " | MEDICATED_PAD | CUTANEOUS | Status: DC | PRN

## 2015-05-11 MED ORDER — LIDOCAINE HCL (PF) 1 % IJ SOLN
30.0000 mL | INTRAMUSCULAR | Status: DC | PRN
  Filled 2015-05-11: qty 30

## 2015-05-11 NOTE — Progress Notes (Signed)
Req IFSE for second time from [place], [place]. CNM attempted to place. IFSE not working.

## 2015-05-11 NOTE — Progress Notes (Signed)
Spoke with Dr. Clearance Coots informed him FHR non-reassuring with pt pushing. Req he come evaluate for VE delivery urgently.

## 2015-05-11 NOTE — Progress Notes (Signed)
Req Dr. Shawnie Pons in house attending attend delivery due to nonreassuring FHR and awaiting arrival of Dr. Clearance Coots.

## 2015-05-11 NOTE — MA[facility] Note (Signed)
Pt been contracting for a few hours getting stronger and closer. Was here yesterday and had appt yesterday, was checked and sent home. No bleeding, reports leaking for 2 days

## 2015-05-11 NOTE — H&P (Signed)
Stacy Kelley is a 24 y.o. female presenting for UC's. Maternal Medical History:  Reason for admission: Contractions.   Fetal activity: Perceived fetal activity is normal.   Last perceived fetal movement was within the past hour.    Prenatal complications: no prenatal complications   OB History    Gravida Para Term Preterm AB TAB SAB Ectopic Multiple Living   0 2     Past Medical History  Diagnosis Date  . Genital herpes   . Gonorrhea    Past Surgical History  Procedure Laterality Date  . Cesarean section     Family History: family history includes Diabetes in her maternal grandfather, maternal grandmother, paternal grandfather, and paternal grandmother; Hypertension in her paternal grandfather. Social History:  reports that she has never smoked. She has never used smokeless tobacco. She reports that she does not drink alcohol or use illicit drugs.   Prenatal Transfer Tool  Maternal Diabetes: No Genetic Screening: Normal Maternal Ultrasounds/Referrals: Normal Fetal Ultrasounds or other Referrals:  None Maternal Substance Abuse:  No Significant Maternal Medications:  None Significant Maternal Lab Results:  None Other Comments:  None  Review of Systems  All other systems reviewed and are negative.   Dilation: 10 Effacement (%): 100 Station: Crowning Exam by:: bp Blood pressure 114/59, pulse 86, temperature 99.6 F (37.6 C), temperature source Oral, resp. rate 18, last menstrual period 08/14/2014, SpO2 100 %, unknown if currently breastfeeding. Maternal Exam:  Uterine Assessment: Contraction strength is firm.  Abdomen: Patient reports no abdominal tenderness. Fetal presentation: vertex  Introitus: Normal vulva. Normal vagina.  Pelvis: adequate for delivery.   Cervix: Cervix evaluated by digital exam.     Physical Exam  Nursing note and vitals reviewed. Constitutional: She is oriented to person, place, and time. She appears well-developed and  well-nourished.  HENT:  Head: Normocephalic and atraumatic.  Eyes: Conjunctivae are normal. Pupils are equal, round, and reactive to light.  Neck: Normal range of motion. Neck supple.  Cardiovascular: Normal rate and regular rhythm.   Respiratory: Effort normal and breath sounds normal.  GI: Soft.  Genitourinary: Vagina normal and uterus normal.  Musculoskeletal: Normal range of motion.  Neurological: She is alert and oriented to person, place, and time.  Skin: Skin is warm and dry.  Psychiatric: She has a normal mood and affect. Her behavior is normal. Judgment and thought content normal.    Prenatal labs: ABO, Rh: --/--/O POS (08/12 1605) Antibody: NEG (08/12 1605) Rubella: 23.10 (03/23 1632) RPR: NON REAC (05/19 1344)  HBsAg: NEGATIVE (03/23 1632)  HIV: NONREACTIVE (05/19 1344)  GBS: NOT DETECTED (07/15 1323)   Assessment/Plan: 39.3 weeks.  Active labor.  Admit.    Yaileen Hofferber A 05/11/2015, 6:49 PM

## 2015-05-11 NOTE — Progress Notes (Signed)
I saw [name], Cory, and baby Chloe when Chloe was brought to NICU.  I offered Kandee Keen emotional support as he stood watching his baby being admitted and I then went downstairs to offer Delicia support and update her on what they were doing to care for Chloe.  They are still in shock and processing all of the information.  We will continue to offer support whenever we see them, but please also page as needs arise.  [address] Dyanne Carrel, Bcc Pager, [phone] 4:20 PM    05/11/15 1600  Clinical Encounter Type  Visited With Patient;Family  Visit Type Spiritual support

## 2015-05-12 ENCOUNTER — Encounter (HOSPITAL_COMMUNITY): Payer: Self-pay | Admitting: *Deleted

## 2015-05-12 LAB — CBC
HEMATOCRIT: 37.2 % (ref 36.0–46.0)
Hemoglobin: 12.3 g/dL (ref 12.0–15.0)
MCH: 28.9 pg (ref 26.0–34.0)
MCHC: 33.1 g/dL (ref 30.0–36.0)
MCV: 87.3 fL (ref 78.0–100.0)
PLATELETS: 170 10*3/uL (ref 150–400)
RBC: 4.26 MIL/uL (ref 3.87–5.11)
RDW: 15.4 % (ref 11.5–15.5)
WBC: 18.5 10*3/uL — ABNORMAL HIGH (ref 4.0–10.5)

## 2015-05-12 LAB — RPR: RPR: NONREACTIVE

## 2015-05-12 LAB — HIV ANTIBODY (ROUTINE TESTING W REFLEX): HIV SCREEN 4TH GENERATION: NONREACTIVE

## 2015-05-12 NOTE — Progress Notes (Signed)
Pt had blankets up over head. Woke pt and refused assessment and Motrin and requested to return @ 0830.

## 2015-05-12 NOTE — Lactation Note (Addendum)
This note was copied from the chart of Stacy Myrlene Riera. Lactation Consultation Note  Patient Name: Stacy Kelley [id number] Date: 05/12/2015 Reason for consult: Initial assessment;NICU baby;Infant < 6lbs  Baby is 23 hours , in NICU , apgar scores 1-4-5 term infant,  Per mom the RN set me up with a DEBP yesterday and I've pumped with the  Pump x4 since yesterday but haven't got'en anything. Mom mentioned she knows how to hand express And feels comfortable. LC discussed the importance of consistent pumping about every 2-3 hours ( 8 times a day )  Also discussed power pumping once a day , along with hand expressing. LC encouraged mom to call WIC and leave a message for need for DEBP, since she is active with WIC. Mother informed of post-discharge support and given phone number to the lactation department, including services for  phone call assistance; out-patient appointments; and breastfeeding support group. List of other breastfeeding resources  in the community given in the handout. Encouraged mother to call for problems or concerns related to breastfeeding. Mom may be D/C tomorrow , LC informed mom of the [facility] loaner program for DEBP  And $30.00.    Maternal Data Has patient been taught Hand Expression?: Yes (per mom the RN showed me how to hand express and I feel comfortable with it )  Feeding    LATCH Score/Interventions                      Lactation Tools Discussed/Used WIC Program: Yes (per mom active . LC enc to call and leave a message for DEBP need )   Consult Status Consult Status: Follow-up Date: 05/13/15 Follow-up type: In-patient    Kathrin Greathouse 05/12/2015, 1:42 PM

## 2015-05-12 NOTE — Progress Notes (Signed)
Post Partum Day 1 Subjective: no complaints  Objective: Blood pressure 108/45, pulse 78, temperature 97.8 F (36.6 C), temperature source Oral, resp. rate 18, last menstrual period 08/14/2014, SpO2 100 %, unknown if currently breastfeeding.  Physical Exam:  General: alert and no distress Lochia: appropriate Uterine Fundus: firm Incision: healing well DVT Evaluation: No evidence of DVT seen on physical exam.   Recent Labs  05/11/15 1605 05/12/15 0552  HGB 13.4 12.3  HCT 39.9 37.2    Assessment/Plan: Plan for discharge tomorrow   LOS: 1 day   HARPER,CHARLES A 05/12/2015, 9:03 AM

## 2015-05-13 MED ORDER — OXYCODONE-ACETAMINOPHEN 5-325 MG PO TABS: 1.0000 | ORAL_TABLET | ORAL | Status: DC | PRN

## 2015-05-13 MED ORDER — IBUPROFEN 600 MG PO TABS: 600.0000 mg | ORAL_TABLET | Freq: Four times a day (QID) | ORAL | Status: DC | PRN

## 2015-05-13 NOTE — Lactation Note (Signed)
This note was copied from the chart of Stacy Kelley. Lactation Consultation Note  Patient Name: Stacy Kelley [id number] Date: 05/13/2015   Visited with Mom and [name] on day of Mom's discharge.  Baby in NICU, 57 hrs old, baby [redacted]w[redacted]d (32lbs8.2oz) admitted r/o meconium aspiration.  Encouraging Mom to visit with baby often, and pump as close to 8 times in 24 hrs.  Offered [facility] loaner pump, but Mom declined.  She plans to call [facility] tomorrow (Monday) about obtaining a DEBP.  [facility] referral faxed.  Reassured Mom that she can call Lactation if she has any questions, that we were here to support her efforts to provide breast milk for her baby.  Currently baby is NPO.  Mom to call prn for support.      Judee Clara 05/13/2015, 11:23 AM

## 2015-05-13 NOTE — Progress Notes (Signed)
Post Partum Day 2 Subjective: no complaints  Objective: Blood pressure 98/54, pulse 70, temperature 98.7 F (37.1 C), temperature source Oral, resp. rate 15, height  (1.626 m), weight 137 lb (62.143 kg), last menstrual period 08/14/2014, SpO2 100 %, unknown if currently breastfeeding.  Physical Exam:  General: alert and no distress Lochia: appropriate Uterine Fundus: firm Incision: none DVT Evaluation: No evidence of DVT seen on physical exam.   Recent Labs  05/11/15 1605 05/12/15 0552  HGB 13.4 12.3  HCT 39.9 37.2    Assessment/Plan: Discharge home   LOS: 2 days   HARPER,CHARLES A 05/13/2015, 7:08 AM

## 2015-05-14 ENCOUNTER — Encounter: Payer: Self-pay | Admitting: *Deleted

## 2015-05-14 NOTE — Discharge Summary (Signed)
Obstetric Discharge Summary Reason for Admission: onset of labor Prenatal Procedures: ultrasound Intrapartum Procedures: spontaneous vaginal delivery Postpartum Procedures: none Complications-Operative and Postpartum: none HEMOGLOBIN  Date Value Ref Range Status  05/12/2015 12.3 12.0 - 15.0 g/dL Final   HCT  Date Value Ref Range Status  05/12/2015 37.2 36.0 - 46.0 % Final    Physical Exam:  General: alert and no distress Lochia: appropriate Uterine Fundus: firm Incision: healing well DVT Evaluation: No evidence of DVT seen on physical exam.  Discharge Diagnoses: Term Pregnancy-delivered  Discharge Information: Date: 05/14/2015 Activity: pelvic rest Diet: routine Medications: PNV, Ibuprofen, Colace and Percocet Condition: stable Instructions: refer to practice specific booklet Discharge to: home Follow-up Information    Follow up with Roe Coombs, CNM In 1 week.   Specialty:  Certified Nurse Midwife   Contact information:   [address] [place] [address] [phone]       Newborn Data: Live born female  Birth Weight: 5 lb 4 oz (2380 g) APGAR: 1, 4  Baby in NICU  HARPER,CHARLES A 05/14/2015, 9:03 AM

## 2015-05-21 ENCOUNTER — Other Ambulatory Visit (HOSPITAL_COMMUNITY)
Admission: RE | Admit: 2015-05-21 | Discharge: 2015-05-21 | Disposition: A | Discharge status: Home / Self Care | Payer: Medicaid Other | Source: Ambulatory Visit | Attending: Neonatology | Admitting: Neonatology

## 2015-05-23 ENCOUNTER — Ambulatory Visit: Payer: Self-pay

## 2015-05-23 LAB — PLATELET ANTIBODY PROFILE, SERUM
HLA AB SER QL EIA: POSITIVE — AB
IA/IIA ANTIBODY: NEGATIVE
IB/IX ANTIBODY: NEGATIVE
IIB/IIIA ANTIBODY: POSITIVE — AB

## 2015-05-23 NOTE — Lactation Note (Signed)
This note was copied from the chart of Stacy Jacqualynn Parco. Lactation Consultation Note  Patient Name: Stacy Kelley [id number] Date: 05/23/2015 Reason for consult: Follow-up assessment;NICU baby NICU baby 9 days old. Mom states that her milk is "drying up." Mom has not been pumping regularly, and states that it is just too hard to keep pumping. Older child in NICU with mom at infant's bedside and mom states that she did not nurse the older child at all. Mom states that she will need to return the [facility] pump she has as well. Mom states that she just doesn't really have any milk anymore. Mom states that she understands that the milk is "drying up" because she hasn't really been pumping.   Maternal Data    Feeding Feeding Type: Formula  LATCH Score/Interventions                      Lactation Tools Discussed/Used     Consult Status Consult Status: PRN    Geralynn Ochs 05/23/2015, 12:08 PM

## 2015-05-24 ENCOUNTER — Ambulatory Visit: Payer: Medicaid Other | Admitting: Obstetrics

## 2015-05-24 ENCOUNTER — Ambulatory Visit (INDEPENDENT_AMBULATORY_CARE_PROVIDER_SITE_OTHER): Payer: Medicaid Other | Admitting: Certified Nurse Midwife

## 2015-05-24 ENCOUNTER — Encounter: Payer: Self-pay | Admitting: Certified Nurse Midwife

## 2015-05-24 DIAGNOSIS — Z9189 Other specified personal risk factors, not elsewhere classified: Secondary | ICD-10-CM

## 2015-05-24 MED ORDER — METOCLOPRAMIDE HCL 10 MG PO TABS: 10.0000 mg | ORAL_TABLET | Freq: Four times a day (QID) | ORAL | Status: DC

## 2015-05-24 NOTE — Progress Notes (Addendum)
Patient ID: Stacy Kelley, female   DOB: Jun 04, 1991, 24 y.o.   MRN: [id number]  Subjective:     Stacy Kelley is a 24 y.o. female who presents for a postpartum visit. She is 2 weeks postpartum following a spontaneous vaginal delivery. I have fully reviewed the prenatal and intrapartum course. The delivery was at 39 gestational weeks. Outcome: vaginal birth after cesarean (VBAC). Anesthesia: none. Postpartum course has been normal. Baby's course has been abnormal, NICU admission at delivery. Baby is feeding by OG tube feeds/bottle feeds in NICU.  Mom is pumping. . Bleeding thin lochia and pink. Bowel function is normal. Bladder function is normal. Patient is not sexually active. Contraception method is abstinence. Postpartum depression screening: negative.  Tobacco, alcohol and substance abuse history reviewed.  Adult immunizations reviewed including TDAP, rubella and varicella.  Discussed at length the delivery and encouraged [facility].  States she is happy her daughter is doing better.    The following portions of the patient's history were reviewed and updated as appropriate: allergies, current medications, past family history, past medical history, past social history, past surgical history and problem list.  Review of Systems A comprehensive review of systems was negative.   Objective:    BP 117/78 mmHg  Pulse 67  Temp(Src) 97.8 F (36.6 C)  Wt 134 lb (60.782 kg)  LMP 08/14/2014  Breastfeeding? Yes  General:  alert, cooperative and no distress   Breasts:  inspection negative, no nipple discharge or bleeding, no masses or nodularity palpable  Lungs: clear to auscultation bilaterally  Heart:  regular rate and rhythm, S1, S2 normal, no murmur, click, rub or gallop  Abdomen: soft, non-tender; bowel sounds normal; no masses,  no organomegaly   Vulva:  not evaluated  Vagina: not evaluated  Cervix:  not evaluated  Corpus: enlarged, 16 weeks size  Adnexa:  not evaluated  Rectal  Exam: Not performed.          50% of 15 min visit spent on counseling and coordination of care.  Assessment:     Normal 2 week postpartum exam. Pap smear not done at today's visit.   Decreased breast milk production.   Plan:    1. Contraception: abstinence 2. Discussed contraception options, she is undecided.   3. Follow up in: 4 weeks or as needed.   4. Galactagogues: Reglan RX 2hr GTT for h/o GDM/screening for DM q 3 yrs per ADA recommendations Preconception counseling provided Healthy lifestyle practices reviewed

## 2015-05-24 NOTE — Addendum Note (Signed)
Addended by: Samantha Crimes on: 05/24/2015 05:32 PM   Modules accepted: Orders

## 2015-06-21 ENCOUNTER — Ambulatory Visit (INDEPENDENT_AMBULATORY_CARE_PROVIDER_SITE_OTHER): Payer: Medicaid Other | Admitting: Certified Nurse Midwife

## 2015-06-21 ENCOUNTER — Encounter: Payer: Self-pay | Admitting: Certified Nurse Midwife

## 2015-06-21 ENCOUNTER — Encounter: Payer: Self-pay | Admitting: *Deleted

## 2015-06-21 MED ORDER — MEDROXYPROGESTERONE ACETATE 150 MG/ML IM SUSP: 150.0000 mg | INTRAMUSCULAR | Status: DC

## 2015-06-21 NOTE — Progress Notes (Signed)
Patient ID: Stacy Kelley, female   DOB: 04/18/1991, 24 y.o.   MRN: [id number]  Subjective:     Stacy Kelley is a 24 y.o. female who presents for a postpartum visit. She is 6 weeks postpartum following a spontaneous vaginal delivery. I have fully reviewed the prenatal and intrapartum course. The delivery was at 39 gestational weeks. Outcome: spontaneous vaginal delivery. Anesthesia: none. Postpartum course has been normal. Baby's course has been altered by NICU admission, home now and doing well. Baby is feeding by bottle - Similac Advance. Bleeding no bleeding. Bowel function is normal. Bladder function is normal. Patient is not sexually active. Contraception method is abstinence. Postpartum depression screening: negative.  Tobacco, alcohol and substance abuse history reviewed.  Adult immunizations reviewed including TDAP, rubella and varicella. Desires to return to work at [employer].    The following portions of the patient's history were reviewed and updated as appropriate: allergies, current medications, past family history, past medical history, past social history, past surgical history and problem list.  Review of Systems A comprehensive review of systems was negative.   Objective:    BP 107/71 mmHg  Pulse 63  Temp(Src) 98.3 F (36.8 C)  Ht  (1.575 m)  Wt 129 lb (58.514 kg)  BMI 23.59 kg/m2  Breastfeeding? No  General:  alert, cooperative and no distress   Breasts:  inspection negative, no nipple discharge or bleeding, no masses or nodularity palpable  Lungs: clear to auscultation bilaterally  Heart:  regular rate and rhythm, S1, S2 normal, no murmur, click, rub or gallop  Abdomen: soft, non-tender; bowel sounds normal; no masses,  no organomegaly   Vulva:  normal  Vagina: normal vagina  Cervix:  No CMT  Corpus: normal size, contour, position, consistency, mobility, non-tender  Adnexa:  normal adnexa  Rectal Exam: Not performed.          50% of 15 min visit spent on  counseling and coordination of care.  Assessment:     Normal 6 week postpartum exam. Pap smear not done at today's visit.    On menses  Contraception counseling Plan:    1. Contraception: Depo-Provera injections 2. Return to work note given 3. Follow up in: 3 months or as needed.  2hr GTT for h/o GDM/screening for DM q 3 yrs per ADA recommendations Preconception counseling provided Healthy lifestyle practices reviewed

## 2015-08-07 ENCOUNTER — Encounter: Payer: Self-pay | Admitting: Certified Nurse Midwife

## 2015-08-07 ENCOUNTER — Ambulatory Visit (INDEPENDENT_AMBULATORY_CARE_PROVIDER_SITE_OTHER): Payer: Medicaid Other | Admitting: Certified Nurse Midwife

## 2015-08-07 VITALS — BP 140/87 | HR 68 | Temp 98.2°F | Ht 63.0 in | Wt 126.0 lb

## 2015-08-07 DIAGNOSIS — N898 Other specified noninflammatory disorders of vagina: Secondary | ICD-10-CM

## 2015-08-07 MED ORDER — FLUCONAZOLE 100 MG PO TABS: 100.0000 mg | ORAL_TABLET | Freq: Once | ORAL | Status: DC

## 2015-08-07 MED ORDER — MEDROXYPROGESTERONE ACETATE 150 MG/ML IM SUSP: 150.0000 mg | INTRAMUSCULAR | Status: DC

## 2015-08-07 MED ORDER — METRONIDAZOLE 500 MG PO TABS: 500.0000 mg | ORAL_TABLET | Freq: Two times a day (BID) | ORAL | Status: DC

## 2015-08-07 NOTE — Progress Notes (Signed)
Patient ID: Stacy Kelley, female   DOB: 22-Nov-1990, 24 y.o.   MRN: [id number]   Chief Complaint  Patient presents with  . Vaginitis    Discharge with odor    HPI Stacy Kelley is a 24 y.o. female.  Has had recent sexual intercourse, states that she used a condom.  Has had discharge since Sunday with an odor, denies any vaginal itching or problems with urination.  Has not tried anything for the odor.  Has not had her Depo shot.     HPI  Past Medical History  Diagnosis Date  . Genital herpes   . Gonorrhea     Past Surgical History  Procedure Laterality Date  . Cesarean section      Family History  Problem Relation Age of Onset  . Diabetes Maternal Grandmother   . Diabetes Maternal Grandfather   . Diabetes Paternal Grandmother   . Hypertension Paternal Grandfather   . Diabetes Paternal Grandfather     Social History Social History  Substance Use Topics  . Smoking status: Never Smoker   . Smokeless tobacco: Never Used  . Alcohol Use: No    No Known Allergies  Current Outpatient Prescriptions  Medication Sig Dispense Refill  . fluconazole (DIFLUCAN) 100 MG tablet Take 1 tablet (100 mg total) by mouth once. Repeat dose in 48-72 hour. 3 tablet 0  . medroxyPROGESTERone (DEPO-PROVERA) 150 MG/ML injection Inject 1 mL (150 mg total) into the muscle every 3 (three) months. 1 mL 4  . metroNIDAZOLE (FLAGYL) 500 MG tablet Take 1 tablet (500 mg total) by mouth 2 (two) times daily. 14 tablet 0   No current facility-administered medications for this visit.    Review of Systems Review of Systems Constitutional: negative for fatigue and weight loss Respiratory: negative for cough and wheezing Cardiovascular: negative for chest pain, fatigue and palpitations Gastrointestinal: negative for abdominal pain and change in bowel habits Genitourinary:negative Integument/breast: negative for nipple discharge Musculoskeletal:negative for myalgias Neurological: negative for gait  problems and tremors Behavioral/Psych: negative for abusive relationship, depression Endocrine: negative for temperature intolerance     Blood pressure 140/87, pulse 68, temperature 98.2 F (36.8 C), height  (1.6 m), weight 126 lb (57.153 kg), last menstrual period 07/11/2015, not currently breastfeeding.  Physical Exam Physical Exam General:   alert  Skin:   no rash or abnormalities  Lungs:   clear to auscultation bilaterally  Heart:   regular rate and rhythm, S1, S2 normal, no murmur, click, rub or gallop  Breasts:   normal without suspicious masses, skin or nipple changes or axillary nodes  Abdomen:  normal findings: no organomegaly, soft, non-tender and no hernia  Pelvis:  External genitalia: normal general appearance Urinary system: urethral meatus normal and bladder without fullness, nontender Vaginal: normal without tenderness, induration or masses Cervix: normal appearance Adnexa: normal bimanual exam Uterus: anteverted and non-tender, normal size    50% of 15 min visit spent on counseling and coordination of care.   Data Reviewed Previous medical hx, labs, meds  Assessment     BV Vaginitis  STD screening exam Contraception management    Plan    Orders Placed This Encounter  Procedures  . SureSwab, Vaginosis/Vaginitis Plus  . GC/Chlamydia Probe Amp   Meds ordered this encounter  Medications  . metroNIDAZOLE (FLAGYL) 500 MG tablet    Sig: Take 1 tablet (500 mg total) by mouth 2 (two) times daily.    Dispense:  14 tablet    Refill:  0  .  fluconazole (DIFLUCAN) 100 MG tablet    Sig: Take 1 tablet (100 mg total) by mouth once. Repeat dose in 48-72 hour.    Dispense:  3 tablet    Refill:  0  . medroxyPROGESTERone (DEPO-PROVERA) 150 MG/ML injection    Sig: Inject 1 mL (150 mg total) into the muscle every 3 (three) months.    Dispense:  1 mL    Refill:  4    Possible management options include: another form of contraception Follow up as needed.

## 2015-08-08 LAB — GC/CHLAMYDIA PROBE AMP
CT Probe RNA: NEGATIVE
GC Probe RNA: NEGATIVE

## 2015-08-10 LAB — SURESWAB, VAGINOSIS/VAGINITIS PLUS
ATOPOBIUM VAGINAE: 6.5 Log (cells/mL)
BV CATEGORY: UNDETERMINED — AB
C. ALBICANS, DNA: NOT DETECTED
C. TRACHOMATIS RNA, TMA: NOT DETECTED
C. glabrata, DNA: NOT DETECTED
C. parapsilosis, DNA: NOT DETECTED
C. tropicalis, DNA: NOT DETECTED
LACTOBACILLUS SPECIES: DETECTED Log (cells/mL)
MEGASPHAERA SPECIES: 7.9 Log (cells/mL)
N. gonorrhoeae RNA, TMA: NOT DETECTED
T. VAGINALIS RNA, QL TMA: NOT DETECTED

## 2015-08-15 ENCOUNTER — Other Ambulatory Visit: Payer: Self-pay | Admitting: Certified Nurse Midwife

## 2015-08-16 ENCOUNTER — Ambulatory Visit (INDEPENDENT_AMBULATORY_CARE_PROVIDER_SITE_OTHER): Payer: Medicaid Other | Admitting: *Deleted

## 2015-08-16 VITALS — BP 111/78 | HR 90 | Temp 98.3°F | Ht 62.0 in | Wt 122.0 lb

## 2015-08-16 DIAGNOSIS — Z30013 Encounter for initial prescription of injectable contraceptive: Secondary | ICD-10-CM | POA: Diagnosis not present

## 2015-08-16 MED ORDER — MEDROXYPROGESTERONE ACETATE 150 MG/ML IM SUSP
150.0000 mg | INTRAMUSCULAR | Status: DC
  Administered 2015-08-16 – 2015-11-08 (×2): 150 mg via INTRAMUSCULAR

## 2015-08-16 NOTE — Progress Notes (Signed)
Patient in office for a pregnancy test to start her Depo. Patient is on her menstrual cycle. Pregnancy test in office is negative.  Patient tolerated injection well. Patient due for next injection November 07, 2015   BP 111/78 mmHg  Pulse 90  Temp(Src) 98.3 F (36.8 C)  Ht 5\' 2"  (1.575 m)  Wt 122 lb (55.339 kg)  BMI 22.31 kg/m2  LMP 08/11/2015   Administrations This Visit    medroxyPROGESTERone (DEPO-PROVERA) injection 150 mg    Admin Date Action Dose Route Administered By         08/16/2015 Given 150 mg Intramuscular Henriette CombsAndrea L Yarely Bebee, LPN

## 2015-10-08 ENCOUNTER — Encounter (HOSPITAL_COMMUNITY): Payer: Self-pay | Admitting: Emergency Medicine

## 2015-10-08 ENCOUNTER — Emergency Department (HOSPITAL_COMMUNITY)
Admission: EM | Admit: 2015-10-08 | Discharge: 2015-10-08 | Disposition: A | Discharge status: Home / Self Care | Payer: Medicaid Other | Source: Home / Self Care | Attending: Family Medicine | Admitting: Family Medicine

## 2015-10-08 DIAGNOSIS — L42 Pityriasis rosea: Secondary | ICD-10-CM

## 2015-10-08 DIAGNOSIS — T50905A Adverse effect of unspecified drugs, medicaments and biological substances, initial encounter: Principal | ICD-10-CM

## 2015-10-08 MED ORDER — TRIAMCINOLONE ACETONIDE 0.1 % EX CREA: 1.0000 "application " | TOPICAL_CREAM | Freq: Two times a day (BID) | CUTANEOUS | Status: DC

## 2015-10-08 NOTE — Discharge Instructions (Signed)
Pityriasis Rosea  Pityriasis rosea is a rash that usually appears on the trunk of the body. It may also appear on the upper arms and upper legs. It usually begins as a single patch, and then more patches begin to develop. The rash may cause mild itching, but it normally does not cause other problems. It usually goes away without treatment. However, it may take weeks or months for the rash to go away completely.  CAUSES  The cause of this condition is not known. The condition does not spread from person to person (is noncontagious).  RISK FACTORS  This condition is more likely to develop in young adults and children. It is most common in the spring and fall.  SYMPTOMS  The main symptom of this condition is a rash.  · The rash usually begins with a single oval patch that is larger than the ones that follow. This is called a herald patch. It generally appears a week or more before the rest of the rash appears.  · When more patches start to develop, they spread quickly on the trunk, back, and arms. These patches are smaller than the first one.  · The patches that make up the rash are usually oval-shaped and pink or red in color. They are usually flat, but they may sometimes be raised so that they can be felt with a finger. They may also be finely crinkled and have a scaly ring around the edge.  · The rash does not typically appear on areas of the skin that are exposed to the sun.  Most people who have this condition do not have other symptoms, but some have mild itching. In a few cases, a mild headache or body aches may occur before the rash appears and then go away.  DIAGNOSIS  Your health care provider may diagnose this condition by doing a physical exam and taking your medical history. To rule out other possible causes for the rash, the health care provider may order blood tests or take a skin sample from the rash to be looked at under a microscope.  TREATMENT  Usually, treatment is not needed for this condition. The  rash will probably go away on its own in 4-8 weeks. In some cases, a health care provider may recommend or prescribe medicine to reduce itching.  HOME CARE INSTRUCTIONS  · Take medicines only as directed by your health care provider.  · Avoid scratching the affected areas of skin.  · Do not take hot baths or use a sauna. Use only warm water when bathing or showering. Heat can increase itching.  SEEK MEDICAL CARE IF:  · Your rash does not go away in 8 weeks.  · Your rash gets much worse.  · You have a fever.  · You have swelling or pain in the rash area.  · You have fluid, blood, or pus coming from the rash area.     This information is not intended to replace advice given to you by your health care provider. Make sure you discuss any questions you have with your health care provider.     Document Released: 10/22/2001 Document Revised: 01/30/2015 Document Reviewed: 08/23/2014  Elsevier Interactive Patient Education ©2016 [employer]

## 2015-10-08 NOTE — ED Notes (Signed)
Rash since November 2016.  Patient has varying sizes of itchy areas.  Patient reports back, legs and arms.

## 2015-10-08 NOTE — ED Provider Notes (Signed)
CSN: [id number]     Arrival date & time 10/08/15  1411 History   First MD Initiated Contact with Patient 10/08/15 1548     Chief Complaint  Patient presents with  . Rash   (Consider location/radiation/quality/duration/timing/severity/associated sxs/prior Treatment) HPI Comments: 25 year old female complaining of an itchy rash for approximately 2 months. Initially started on the trauma. She also has lesions to the arms and legs. She has seen her PCP and had blood work drawn. She states that this visit was a normal one in which no abnormal findings occurred. She states lower was normal. No mention about the etiology of a rash. She  does not recall a herald patch or how it exactly began. The rash conforms to the skin lines along the back. The lesions vary in size and shape, some ovoid some papular some raised and patchy and many isolated annular lesions.  She does not feel ill. She feels generally well. No fevers, chills, fatigue and malaise. Her primary complaint is itching of the lesions.    Past Medical History  Diagnosis Date  . Genital herpes   . Gonorrhea    Past Surgical History  Procedure Laterality Date  . Cesarean section     Family History  Problem Relation Age of Onset  . Diabetes Maternal Grandmother   . Diabetes Maternal Grandfather   . Diabetes Paternal Grandmother   . Hypertension Paternal Grandfather   . Diabetes Paternal Grandfather    Social History  Substance Use Topics  . Smoking status: Never Smoker   . Smokeless tobacco: Never Used  . Alcohol Use: No   OB History    Gravida Para Term Preterm AB TAB SAB Ectopic Multiple Living   5 2 2  3 1 2   0 2     Review of Systems  Constitutional: Negative for fever, activity change and fatigue.  HENT: Negative.   Eyes: Negative.   Respiratory: Negative.   Cardiovascular: Negative.   Genitourinary: Negative.   Skin: Positive for rash.  Neurological: Negative.     Allergies  Review of patient's allergies  indicates no known allergies.  Home Medications   Prior to Admission medications   Medication Sig Start Date End Date Taking? Authorizing Provider  medroxyPROGESTERone (DEPO-PROVERA) 150 MG/ML injection Inject 1 mL (150 mg total) into the muscle every 3 (three) months. Patient taking differently: Inject 150 mg into the muscle every 3 (three) months. First injection was November 2016 08/07/15   Rachelle A Denney, CNM  triamcinolone cream (KENALOG) 0.1 % Apply 1 application topically 2 (two) times daily. 10/08/15   Hayden Rasmussenavid Talishia Betzler, NP   Meds Ordered and Administered this Visit  Medications - No data to display  BP 155/84 mmHg  Pulse 91  Temp(Src) 99 F (37.2 C) (Oral)  Resp 18  SpO2 100% No data found.   Physical Exam  Constitutional: She is oriented to person, place, and time. She appears well-developed. No distress.  Neck: Normal range of motion. Neck supple.  Cardiovascular: Normal rate, regular rhythm and normal heart sounds.   Pulmonary/Chest: Effort normal and breath sounds normal. No respiratory distress.  Musculoskeletal: Normal range of motion. She exhibits no edema or tenderness.  Neurological: She is alert and oriented to person, place, and time. No cranial nerve deficit. She exhibits normal muscle tone.  Skin: Skin is warm and dry. Rash noted.  Rash as described in history of present illness.  Psychiatric: She has a normal mood and affect.  Nursing note and vitals reviewed.  ED Course  Procedures (including critical care time)  Labs Review Labs Reviewed - No data to display  Imaging Review No results found.   Visual Acuity Review  Right Eye Distance:   Left Eye Distance:   Bilateral Distance:    Right Eye Near:   Left Eye Near:    Bilateral Near:         MDM   1. Pityriasis rosea-like drug eruption    Triamcinolone cream.  Benadryl cream and orally for itching. May also use Claritin or Zyrtec. Information on pityriasis given. Some of these lesions  appear to be like bug bites or even bedbugs, particularly on the extremities. Most of the other lesions are fairly consistent with pityriasis. She sleeps with her son and he has no rash.   Hayden Rasmussen, NP 10/08/15 1644

## 2015-11-07 ENCOUNTER — Ambulatory Visit: Payer: Medicaid Other

## 2015-11-08 ENCOUNTER — Ambulatory Visit (INDEPENDENT_AMBULATORY_CARE_PROVIDER_SITE_OTHER): Payer: Medicaid Other | Admitting: *Deleted

## 2015-11-08 VITALS — BP 119/81 | HR 72 | Temp 98.4°F | Wt 118.0 lb

## 2015-11-08 DIAGNOSIS — Z3042 Encounter for surveillance of injectable contraceptive: Secondary | ICD-10-CM

## 2015-11-14 ENCOUNTER — Other Ambulatory Visit: Payer: Medicaid Other

## 2015-11-22 ENCOUNTER — Ambulatory Visit (INDEPENDENT_AMBULATORY_CARE_PROVIDER_SITE_OTHER): Payer: Medicaid Other | Admitting: Certified Nurse Midwife

## 2015-11-22 VITALS — BP 122/74 | HR 89 | Wt 117.0 lb

## 2015-11-22 DIAGNOSIS — Z30018 Encounter for initial prescription of other contraceptives: Secondary | ICD-10-CM | POA: Diagnosis not present

## 2015-11-22 DIAGNOSIS — R21 Rash and other nonspecific skin eruption: Secondary | ICD-10-CM

## 2015-11-22 DIAGNOSIS — Z113 Encounter for screening for infections with a predominantly sexual mode of transmission: Secondary | ICD-10-CM | POA: Diagnosis not present

## 2015-11-22 MED ORDER — DIPHENHYDRAMINE HCL 25 MG PO TABS: 50.0000 mg | ORAL_TABLET | Freq: Every day | ORAL | Status: DC

## 2015-11-22 MED ORDER — PREDNISONE 10 MG (48) PO TBPK: ORAL_TABLET | ORAL | Status: DC

## 2015-11-22 MED ORDER — ETONOGESTREL-ETHINYL ESTRADIOL 0.12-0.015 MG/24HR VA RING: VAGINAL_RING | VAGINAL | Status: DC

## 2015-11-23 ENCOUNTER — Encounter: Payer: Self-pay | Admitting: Certified Nurse Midwife

## 2015-11-23 DIAGNOSIS — R21 Rash and other nonspecific skin eruption: Secondary | ICD-10-CM | POA: Insufficient documentation

## 2015-11-23 LAB — RPR

## 2015-11-23 LAB — HIV ANTIBODY (ROUTINE TESTING W REFLEX): HIV: NONREACTIVE

## 2015-11-23 LAB — HEPATITIS C ANTIBODY: HCV Ab: NEGATIVE

## 2015-11-23 LAB — HEPATITIS B SURFACE ANTIGEN: Hepatitis B Surface Ag: NEGATIVE

## 2015-11-23 NOTE — Progress Notes (Addendum)
Patient ID: Stacy Kelley, female   DOB: Jul 27, 1991, 25 y.o.   MRN: [id number]  Chief Complaint  Patient presents with  . Follow-up    would like HIV/Panel testing, was not done at previous visit.    HPI Stacy Kelley is a 25 y.o. female.  Patient here for STI testing.  She also desires to change birth control methods.  Is currently on Depo injections, is having spotting with the Depo.  Has lost weight since last exam.  Is currently working at [employer].  Presents with a rash that started in November.  Denies any change in soaps, new furniture, pets, or any one else in the home with a rash.  Denies any change in medications.  States that the rash is very itchy. Generalized rash, hands, legs, arms, face, back, abdomen, hips.   No hx of allergies or eczema. Denies having any new sexual partners.  Is not currently sexually active.  Was due for Depo injection at the start of the month.  Desires to try the [employer].  Educated on its use.   Denies any recent infections, fever or joint pain.  Was seen in the ED 10/08/15 for the rash, did not have improvement in condition with Benadryl or Kenalog cream.    HPI  Past Medical History  Diagnosis Date  . Genital herpes   . Gonorrhea     Past Surgical History  Procedure Laterality Date  . Cesarean section      Family History  Problem Relation Age of Onset  . Diabetes Maternal Grandmother   . Diabetes Maternal Grandfather   . Diabetes Paternal Grandmother   . Hypertension Paternal Grandfather   . Diabetes Paternal Grandfather     Social History Social History  Substance Use Topics  . Smoking status: Never Smoker   . Smokeless tobacco: Never Used  . Alcohol Use: No    No Known Allergies  Current Outpatient Prescriptions  Medication Sig Dispense Refill  . diphenhydrAMINE (BENADRYL) 25 MG tablet Take 2 tablets (50 mg total) by mouth at bedtime. 90 tablet 4  . etonogestrel-ethinyl estradiol (NUVARING) 0.12-0.015 MG/24HR vaginal ring Insert  vaginally and leave in place for 3 consecutive weeks, then remove for 1 week. 1 each 12  . medroxyPROGESTERone (DEPO-PROVERA) 150 MG/ML injection Inject 1 mL (150 mg total) into the muscle every 3 (three) months. (Patient not taking: Reported on 11/22/2015) 1 mL 4  . predniSONE (STERAPRED UNI-PAK 48 TAB) 10 MG (48) TBPK tablet Take as directed. 48 tablet 0  . triamcinolone cream (KENALOG) 0.1 % Apply 1 application topically 2 (two) times daily. (Patient not taking: Reported on 11/08/2015) 30 g 0   Current Facility-Administered Medications  Medication Dose Route Frequency Provider Last Rate Last Dose  . medroxyPROGESTERone (DEPO-PROVERA) injection 150 mg  150 mg Intramuscular Q90 days Roe Coombs, CNM   150 mg at 08/16/15 1016    Review of Systems Review of Systems Constitutional: negative for fatigue and weight loss Respiratory: negative for cough and wheezing Cardiovascular: negative for chest pain, fatigue and palpitations Gastrointestinal: negative for abdominal pain and change in bowel habits Genitourinary: + irregular bleeding with Depo injections Integument/breast: negative for nipple discharge, + generalized body RASH Musculoskeletal:negative for myalgias Neurological: negative for gait problems and tremors Behavioral/Psych: negative for abusive relationship, depression Endocrine: negative for temperature intolerance     Blood pressure 154/98, pulse 89, weight 117 lb (53.071 kg), not currently breastfeeding.  Blood pressure recheck: 122/78 Physical Exam Physical Exam General:  alert  Skin:   + erythremic in places, scaling rash on arms, legs, hip, back, face, hands  Lungs:   clear to auscultation bilaterally  Heart:   regular rate and rhythm, S1, S2 normal, no murmur, click, rub or gallop  Breasts:   deferred  Abdomen:  normal findings: no organomegaly, soft, non-tender and no hernia  Pelvis: deferred    50% of 30 min visit spent on counseling and coordination of care.    Consulted with Dr. Clearance Coots regarding POC and generalized rash, steroid trial recommended.   Data Reviewed Previous medical hx, meds, labs  Assessment     Generalized rash of unknown origin ?eczema, lupus, chronic dermatitis Contraception management  STD screening exam.      Plan    Orders Placed This Encounter  Procedures  . HIV antibody  . Hepatitis B surface antigen  . Hepatitis C antibody  . RPR  . Lupus anticoagulant panel  . Ambulatory referral to Dermatology    Referral Priority:  Urgent    Referral Type:  Consultation    Referral Reason:  Specialty Services Required    Requested Specialty:  Dermatology    Number of Visits Requested:  1   Meds ordered this encounter  Medications  . predniSONE (STERAPRED UNI-PAK 48 TAB) 10 MG (48) TBPK tablet    Sig: Take as directed.    Dispense:  48 tablet    Refill:  0  . diphenhydrAMINE (BENADRYL) 25 MG tablet    Sig: Take 2 tablets (50 mg total) by mouth at bedtime.    Dispense:  90 tablet    Refill:  4  . etonogestrel-ethinyl estradiol (NUVARING) 0.12-0.015 MG/24HR vaginal ring    Sig: Insert vaginally and leave in place for 3 consecutive weeks, then remove for 1 week.    Dispense:  1 each    Refill:  12     Possible management options include:skin biopsy Follow up in 3 months.

## 2015-11-24 LAB — RFX DRVVT SCR W/RFLX CONF 1:1 MIX: DRVVT SCREEN: 36 s (ref <=45)

## 2015-11-24 LAB — RFX PTT-LA W/RFX TO HEX PHASE CONF: PTT-LA Screen: 42 s — ABNORMAL HIGH (ref <=40)

## 2015-11-24 LAB — LUPUS ANTICOAGULANT PANEL

## 2015-11-24 LAB — RFLX HEXAGONAL PHASE CONFIRM: HEXAGONAL PHASE CONFIRM: NEGATIVE

## 2015-12-21 NOTE — Progress Notes (Signed)
Patient in office for Depo injection.  Patient is on time for her injection. Patient tolerated injection well. Patient due for next injection on Jan 30, 2016.  BP 119/81 mmHg  Pulse 72  Temp(Src) 98.4 F (36.9 C)  Wt 118 lb (53.524 kg)   Administrations This Visit    medroxyPROGESTERone (DEPO-PROVERA) injection 150 mg    Admin Date Action Dose Route Administered By         11/08/2015 Given 150 mg Intramuscular Henriette CombsAndrea L Abbegail Matuska, LPN

## 2016-02-20 ENCOUNTER — Ambulatory Visit: Payer: Medicaid Other | Admitting: Certified Nurse Midwife

## 2016-03-19 ENCOUNTER — Ambulatory Visit: Payer: Medicaid Other | Admitting: Certified Nurse Midwife

## 2016-03-26 ENCOUNTER — Ambulatory Visit: Payer: Medicaid Other | Admitting: Certified Nurse Midwife

## 2016-03-28 ENCOUNTER — Emergency Department (HOSPITAL_COMMUNITY)
Admission: EM | Admit: 2016-03-28 | Discharge: 2016-03-28 | Disposition: A | Discharge status: Home / Self Care | Payer: Medicaid Other | Attending: Emergency Medicine | Admitting: Emergency Medicine

## 2016-03-28 ENCOUNTER — Encounter (HOSPITAL_COMMUNITY): Payer: Self-pay | Admitting: Emergency Medicine

## 2016-03-28 DIAGNOSIS — R609 Edema, unspecified: Secondary | ICD-10-CM

## 2016-03-28 DIAGNOSIS — H9201 Otalgia, right ear: Secondary | ICD-10-CM | POA: Diagnosis present

## 2016-03-28 DIAGNOSIS — K118 Other diseases of salivary glands: Secondary | ICD-10-CM | POA: Insufficient documentation

## 2016-03-28 MED ORDER — AMOXICILLIN-POT CLAVULANATE 875-125 MG PO TABS: 1.0000 | ORAL_TABLET | Freq: Two times a day (BID) | ORAL | Status: DC

## 2016-03-28 NOTE — ED Notes (Signed)
C/o R ear pain since yesterday. 

## 2016-03-28 NOTE — ED Provider Notes (Signed)
CSN: [id number]     Arrival date & time 03/28/16  1851 History  By signing my name below, I, Ronney LionSuzanne Le, attest that this documentation has been prepared under the direction and in the presence of Newell RubbermaidJeffrey Avryl Roehm, PA-C. Electronically Signed: Ronney LionSuzanne Le, ED Scribe. 03/28/2016. 8:02 PM.    Chief Complaint  Patient presents with  . Otalgia   The history is provided by the patient. No language interpreter was used.    HPI Comments: Stacy Kelley is a 25 y.o. female who presents to the Emergency Department complaining of 5/10 right otalgia that began yesterday. No associated symptoms were noted. She denies a history of frequent ear infections, although she recalls having an ear infection when she was a child. No treatments or modifying factors were noted. She denies left otalgia or fever. Patient states she is scheduled to see her PCP in the next week.   Past Medical History  Diagnosis Date  . Genital herpes   . Gonorrhea    Past Surgical History  Procedure Laterality Date  . Cesarean section     Family History  Problem Relation Age of Onset  . Diabetes Maternal Grandmother   . Diabetes Maternal Grandfather   . Diabetes Paternal Grandmother   . Hypertension Paternal Grandfather   . Diabetes Paternal Grandfather    Social History  Substance Use Topics  . Smoking status: Never Smoker   . Smokeless tobacco: Never Used  . Alcohol Use: No   OB History    Gravida Para Term Preterm AB TAB SAB Ectopic Multiple Living   5 2 2  3 1 2   0 2     Review of Systems  Constitutional: Positive for chills. Negative for fever.  HENT: Positive for ear pain.     Allergies  Review of patient's allergies indicates no known allergies.  Home Medications   Prior to Admission medications   Medication Sig Start Date End Date Taking? Authorizing Provider  amoxicillin-clavulanate (AUGMENTIN) 875-125 MG tablet Take 1 tablet by mouth every 12 (twelve) hours. 03/28/16   Eyvonne MechanicJeffrey Taris Galindo, PA-C   diphenhydrAMINE (BENADRYL) 25 MG tablet Take 2 tablets (50 mg total) by mouth at bedtime. 11/22/15   Roe Coombsachelle A Denney, CNM  etonogestrel-ethinyl estradiol (NUVARING) 0.12-0.015 MG/24HR vaginal ring Insert vaginally and leave in place for 3 consecutive weeks, then remove for 1 week. 11/22/15   Rachelle A Denney, CNM  medroxyPROGESTERone (DEPO-PROVERA) 150 MG/ML injection Inject 1 mL (150 mg total) into the muscle every 3 (three) months. Patient not taking: Reported on 11/22/2015 08/07/15   Rachelle A Denney, CNM  predniSONE (STERAPRED UNI-PAK 48 TAB) 10 MG (48) TBPK tablet Take as directed. 11/22/15   Brock Badharles A Harper, MD  triamcinolone cream (KENALOG) 0.1 % Apply 1 application topically 2 (two) times daily. Patient not taking: Reported on 11/08/2015 10/08/15   Hayden Rasmussenavid Mabe, NP   BP 120/95 mmHg  Pulse 74  Temp(Src) 98.3 F (36.8 C) (Oral)  Resp 18  Ht 5\' 3"  (1.6 m)  Wt 50.667 kg  BMI 19.79 kg/m2  SpO2 100%  LMP 02/28/2016 Physical Exam  Constitutional: She is oriented to person, place, and time. She appears well-developed and well-nourished. No distress.  HENT:  Head: Normocephalic and atraumatic.  Tenderness to palpation of the right angle of the jaw up to the external ear at the posterior parotid. No significant redness, warmth to touch. External auditory canal clear normal, no pain with manipulation of the ear, tympanic membrane normal. No tenderness to palpation of  the mastoid or surrounding bones. Hearing intact.  Oral exam normal, nontender to palpation of the gum line or posterior jaw, no tenderness to palpation of salivary ducts  Eyes: Conjunctivae and EOM are normal.  Neck: Neck supple. No tracheal deviation present.  Neck is supple with full active range of motion  Cardiovascular: Normal rate.   Pulmonary/Chest: Effort normal. No respiratory distress.  Musculoskeletal: Normal range of motion.  Neurological: She is alert and oriented to person, place, and time.  Skin: Skin is warm and  dry.  Psychiatric: She has a normal mood and affect. Her behavior is normal.  Nursing note and vitals reviewed.   ED Course  Procedures (including critical care time)  DIAGNOSTIC STUDIES: Oxygen Saturation is 100% on RA, normal by my interpretation.    COORDINATION OF CARE: 7:43 PM - Discussed treatment plan with pt at bedside which includes follow-up with PCP or return to ED if symptoms worsen over the next few days. Pt verbalized understanding and agreed to plan.   MDM   Final diagnoses:  Parotid swelling   Labs:  Imaging:  Consults:  Therapeutics:  Discharge Meds: Augmentin  Assessment/Plan:25 year old female presents today with ear pain. She has minor swelling at the posterior parotid, she is afebrile nontoxic in no acute distress. Question early parotitis, stone. Patient will be started on antibiotics, encouraged follow-up in 3 days for reevaluation. She is instructed to return immediately if any new or worsening signs or symptoms present. Patient verbalized understanding and agreement to today's plan and had no further questions or concerns at the time of discharge. Patient's case was shared with attending provider Dr. Rhunette CroftNanavati who agreed to assessment and plan.   I personally performed the services described in this documentation, which was scribed in my presence. The recorded information has been reviewed and is accurate.    Eyvonne MechanicJeffrey Paislei Dorval, PA-C 03/29/16 [id number]  Derwood KaplanAnkit Nanavati, MD 03/29/16 [phone]

## 2016-03-28 NOTE — Discharge Instructions (Signed)
Please take Anaprox as directed, please follow-up with primary care provider in 3 days for reevaluation. If symptoms worsen, or you experience any new signs or symptoms please return immediately for further evaluation.

## 2016-04-08 ENCOUNTER — Encounter (HOSPITAL_COMMUNITY): Payer: Self-pay | Admitting: *Deleted

## 2016-04-08 ENCOUNTER — Emergency Department (HOSPITAL_COMMUNITY)
Admission: EM | Admit: 2016-04-08 | Discharge: 2016-04-08 | Disposition: A | Discharge status: Home / Self Care | Payer: Medicaid Other | Attending: Emergency Medicine | Admitting: Emergency Medicine

## 2016-04-08 DIAGNOSIS — R21 Rash and other nonspecific skin eruption: Secondary | ICD-10-CM | POA: Diagnosis not present

## 2016-04-08 DIAGNOSIS — T7840XA Allergy, unspecified, initial encounter: Secondary | ICD-10-CM | POA: Diagnosis present

## 2016-04-08 MED ORDER — PREDNISONE 20 MG PO TABS: 40.0000 mg | ORAL_TABLET | Freq: Every day | ORAL | Status: DC

## 2016-04-08 MED ORDER — DIPHENHYDRAMINE HCL 25 MG PO TABS: 25.0000 mg | ORAL_TABLET | Freq: Four times a day (QID) | ORAL | Status: DC

## 2016-04-08 MED ORDER — RANITIDINE HCL 150 MG/10ML PO SYRP: 150.0000 mg | ORAL_SOLUTION | Freq: Once | ORAL | Status: DC

## 2016-04-08 MED ORDER — DIPHENHYDRAMINE HCL 25 MG PO CAPS
25.0000 mg | ORAL_CAPSULE | Freq: Once | ORAL | Status: AC
  Administered 2016-04-08: 25 mg via ORAL

## 2016-04-08 MED ORDER — DEXAMETHASONE 1 MG/ML PO CONC: 4.0000 mg | Freq: Once | ORAL | Status: DC

## 2016-04-08 MED ORDER — ONDANSETRON 4 MG PO TBDP
4.0000 mg | ORAL_TABLET | Freq: Once | ORAL | Status: AC | PRN
  Administered 2016-04-08: 4 mg via ORAL

## 2016-04-08 MED ORDER — DEXAMETHASONE 10 MG/ML FOR PEDIATRIC ORAL USE
4.0000 mg | Freq: Once | INTRAMUSCULAR | Status: DC
  Filled 2016-04-08: qty 0.4

## 2016-04-08 MED ORDER — DIPHENHYDRAMINE HCL 25 MG PO CAPS
ORAL_CAPSULE | ORAL | Status: AC
  Filled 2016-04-08: qty 1

## 2016-04-08 MED ORDER — CLINDAMYCIN HCL 150 MG PO CAPS: 450.0000 mg | ORAL_CAPSULE | Freq: Four times a day (QID) | ORAL | Status: DC

## 2016-04-08 MED ORDER — ONDANSETRON 4 MG PO TBDP
ORAL_TABLET | ORAL | Status: AC
  Filled 2016-04-08: qty 1

## 2016-04-08 MED ORDER — DEXAMETHASONE SODIUM PHOSPHATE 4 MG/ML IJ SOLN: 4.0000 mg | Freq: Once | INTRAMUSCULAR | Status: DC

## 2016-04-08 NOTE — ED Provider Notes (Signed)
CSN: [id number]     Arrival date & time 04/08/16  1946 History  By signing my name below, I, Stacy Kelley, attest that this documentation has been prepared under the direction and in the presence of Stacy Born, PA-C Electronically Signed: Soijett Kelley, ED Scribe. 04/08/2016. 9:55 PM.   Chief Complaint  Patient presents with  . Allergic Reaction    The history is provided by the patient. No language interpreter was used.    HPI Comments: Stacy Kelley is a 25 y.o. female who presents to the Emergency Department complaining of allergic reaction onset today. Pt notes that she was recently seen in the ED for right ear pain and Rx abx medications. Pt reports that she only took the abx Rx for one day, stopped taking it for 2 days and then began to take the Rx again today. She states that she is having associated symptoms of resolved pruritic rash, resolved vomiting and sore throat due to vomiting. She states that she has not tried any medications for the with no relief for her symptoms. She denies trouble breathing and any other symptoms.  Per pt chart review: Pt was seen in the ED on 03/28/2016 for right ear pain. Pt was Rx augmentin and informed to follow up in 3 days for a re-evaluation.    Past Medical History  Diagnosis Date  . Genital herpes   . Gonorrhea    Past Surgical History  Procedure Laterality Date  . Cesarean section     Family History  Problem Relation Age of Onset  . Diabetes Maternal Grandmother   . Diabetes Maternal Grandfather   . Diabetes Paternal Grandmother   . Hypertension Paternal Grandfather   . Diabetes Paternal Grandfather    Social History  Substance Use Topics  . Smoking status: Never Smoker   . Smokeless tobacco: Never Used  . Alcohol Use: No   OB History    Gravida Para Term Preterm AB TAB SAB Ectopic Multiple Living   0 2     Review of Systems  Constitutional: Negative for fever and chills.  HENT: Positive for sore throat  (due to vomiting).   Respiratory: Negative for shortness of breath.   Gastrointestinal: Positive for vomiting (resolved).      Allergies  Review of patient's allergies indicates no known allergies.  Home Medications   Prior to Admission medications   Medication Sig Start Date End Date Taking? Authorizing Provider  amoxicillin-clavulanate (AUGMENTIN) 875-125 MG tablet Take 1 tablet by mouth every 12 (twelve) hours. 03/28/16   Eyvonne Mechanic, PA-C  diphenhydrAMINE (BENADRYL) 25 MG tablet Take 2 tablets (50 mg total) by mouth at bedtime. 11/22/15   Roe Coombs, CNM  etonogestrel-ethinyl estradiol (NUVARING) 0.12-0.015 MG/24HR vaginal ring Insert vaginally and leave in place for 3 consecutive weeks, then remove for 1 week. 11/22/15   Rachelle A Denney, CNM  medroxyPROGESTERone (DEPO-PROVERA) 150 MG/ML injection Inject 1 mL (150 mg total) into the muscle every 3 (three) months. Patient not taking: Reported on 11/22/2015 08/07/15   Rachelle A Denney, CNM  predniSONE (STERAPRED UNI-PAK 48 TAB) 10 MG (48) TBPK tablet Take as directed. 11/22/15   Brock Bad, MD  triamcinolone cream (KENALOG) 0.1 % Apply 1 application topically 2 (two) times daily. Patient not taking: Reported on 11/08/2015 10/08/15   Hayden Rasmussen, NP   BP 122/85 mmHg  Pulse 83  Temp(Src) 98 F (36.7 C) (Oral)  Resp 18  SpO2  96%  LMP 03/30/2016   Physical Exam  Constitutional: She is oriented to person, place, and time. She appears well-developed and well-nourished. No distress.  Not totally cooperative with exam.   HENT:  Head: Normocephalic and atraumatic.  Mouth/Throat: Uvula is midline, oropharynx is clear and moist and mucous membranes are normal.  No lip swelling. No throat swelling. Swallowing secretions.   Eyes: EOM are normal.  Neck: Neck supple.  Cardiovascular: Normal rate.   Pulmonary/Chest: Effort normal. No tachypnea. No respiratory distress.  No tachypnea or respiratory distress.   Abdominal: She  exhibits no distension.  Musculoskeletal: Normal range of motion.  Neurological: She is alert and oriented to person, place, and time.  Skin: Skin is warm and dry.  No rash. Pt reports subjective itching.  Psychiatric: She has a normal mood and affect. Her behavior is normal.  Nursing note and vitals reviewed.   ED Course  Procedures (including critical care time) DIAGNOSTIC STUDIES: Oxygen Saturation is 96% on RA, nl by my interpretation.    COORDINATION OF CARE: 9:54 PM Discussed treatment plan with pt at bedside which includes zantac, decadron, zofran, benadryl, clindamycin Rx, and pt agreed to plan.   MDM   Final diagnoses:  Rash and nonspecific skin eruption   25 year old female presents with subjective rash and itching following taking Augmentin. Patient is afebrile, not tachycardic or tachypneic, normotensive, and not hypoxic. She is largely uncooperative with exam. She was given Benadryl and states she is less itchy and rash is gone. Unclear if this is a true drug reaction however advised her to stop Augmentin and will rx Clindamycin instead since she has not taken her full course of antibiotics. Attempted to give her steroids and H2 blocker however patient is requesting to be d/c'ed home. Will d/c with prednisone and benadryl. Patient is NAD, non-toxic, with stable VS. Patient is informed of clinical course, understands medical decision making process, and agrees with plan. Opportunity for questions provided and all questions answered. Return precautions given.  I personally performed the services described in this documentation, which was scribed in my presence. The recorded information has been reviewed and is accurate.    Stacy BornKelly Marie Julieanne Hadsall, PA-C 04/09/16 0102  Marily MemosJason Mesner, MD 04/12/16 [id number]

## 2016-04-08 NOTE — Discharge Instructions (Signed)
Drug Rash °A drug rash is a change in the color or texture of the skin that is caused by a drug. It can develop minutes, hours, or days after the person takes the drug. °CAUSES °This condition is usually caused by a drug allergy. It can also be caused by exposure to sunlight after taking a drug that makes the skin sensitive to light. Drugs that commonly cause rashes include: °· Penicillin. °· Antibiotic medicines. °· Medicines that treat seizures. °· Medicines that treat cancer (chemotherapy). °· Aspirin and other nonsteroidal anti-inflammatory drugs (NSAIDs). °· Injectable dyes that contain iodine. °· Insulin. °SYMPTOMS °Symptoms of this condition include: °· Redness. °· Tiny bumps. °· Peeling. °· Itching. °· Itchy welts (hives). °· Swelling. °The rash may appear on a small area of skin or all over the body. °DIAGNOSIS °To diagnose the condition, your health care provider will do a physical exam. He or she may also order tests to find out which drug caused the rash. Tests to find the cause of a rash include: °· Skin tests. °· Blood tests. °· Drug challenge. For this test, you stop taking all of the drugs that you do not need to take, and then you start taking them again by adding back one of the drugs at a time. °TREATMENT °A drug rash may be treated with medicines, including: °· Antihistamines. These may be given to relieve itching. °· An NSAID. This may be given to reduce swelling and treat pain. °· A steroid drug. This may be given to reduce swelling. °The rash usually goes away when the person stops taking the drug that caused it. °HOME CARE INSTRUCTIONS °· Take medicines only as directed by your health care provider. °· Let all of your health care providers know about any drug reactions you have had in the past. °· If you have hives, take a cool shower or use a cool compress to relieve itchiness. °SEEK MEDICAL CARE IF: °· You have a fever. °· Your rash is not going away. °· Your rash gets worse. °· Your rash  comes back. °· You have wheezing or coughing. °SEEK IMMEDIATE MEDICAL CARE IF: °· You start to have breathing problems. °· You start to have shortness of breath. °· You face or throat starts to swell. °· You have severe weakness with dizziness or fainting. °· You have chest pain. °  °This information is not intended to replace advice given to you by your health care provider. Make sure you discuss any questions you have with your health care provider. °  °Document Released: 10/23/2004 Document Revised: 10/06/2014 Document Reviewed: 07/12/2014 °Elsevier Interactive Patient Education ©2016 [employer] ° °

## 2016-04-08 NOTE — ED Notes (Signed)
Patient denies pain and is resting comfortably.  

## 2016-04-08 NOTE — ED Notes (Signed)
Patient with episode of vomiting

## 2016-04-08 NOTE — ED Notes (Signed)
PT st's she has been taking antibiotic for infection  St's  Today after taking she started to itch.

## 2016-04-08 NOTE — ED Notes (Signed)
Patient has been taken amoxicillin since visit on 29 for ear infection, tonight states she feels itchy and short of breath. Patient reports diarrhea as well. No swelling to tongue or throat. No swelling to eyes or lips.

## 2016-04-11 ENCOUNTER — Ambulatory Visit: Payer: Medicaid Other | Admitting: Certified Nurse Midwife

## 2016-07-28 ENCOUNTER — Encounter (HOSPITAL_COMMUNITY): Payer: Self-pay

## 2016-07-28 ENCOUNTER — Emergency Department (HOSPITAL_COMMUNITY)
Admission: EM | Admit: 2016-07-28 | Discharge: 2016-07-28 | Disposition: A | Discharge status: Home / Self Care | Payer: Medicaid Other | Attending: Emergency Medicine | Admitting: Emergency Medicine

## 2016-07-28 DIAGNOSIS — Y939 Activity, unspecified: Secondary | ICD-10-CM | POA: Insufficient documentation

## 2016-07-28 DIAGNOSIS — Y999 Unspecified external cause status: Secondary | ICD-10-CM | POA: Insufficient documentation

## 2016-07-28 DIAGNOSIS — Y929 Unspecified place or not applicable: Secondary | ICD-10-CM | POA: Insufficient documentation

## 2016-07-28 DIAGNOSIS — X509XXA Other and unspecified overexertion or strenuous movements or postures, initial encounter: Secondary | ICD-10-CM | POA: Insufficient documentation

## 2016-07-28 DIAGNOSIS — S161XXA Strain of muscle, fascia and tendon at neck level, initial encounter: Secondary | ICD-10-CM | POA: Insufficient documentation

## 2016-07-28 MED ORDER — METHOCARBAMOL 500 MG PO TABS: 500.0000 mg | ORAL_TABLET | Freq: Two times a day (BID) | ORAL | 0 refills | Status: DC

## 2016-07-28 MED ORDER — NAPROXEN 500 MG PO TABS: 500.0000 mg | ORAL_TABLET | Freq: Two times a day (BID) | ORAL | 0 refills | Status: DC

## 2016-07-28 NOTE — ED Triage Notes (Signed)
Per Pt, Pt is coming from home with complaints of neck pain that started on Thursday when she woke up. Reports having neck pain that worsened after she stretched, and she "heard a pop." Pt is alert and oriented x4.

## 2016-07-28 NOTE — ED Notes (Signed)
Declined W/C at D/C and was escorted to lobby by RN. 

## 2016-07-28 NOTE — ED Provider Notes (Signed)
[facility]-EMERGENCY DEPT Provider Note   CSN: [id number] Arrival date & time: 07/28/16  1746  By signing my name below, I, Phillis HaggisGabriella Gaje, attest that this documentation has been prepared under the direction and in the presence of Revere Maahs, PA-C. Electronically Signed: Phillis HaggisGabriella Gaje, ED Scribe. 07/28/16. 6:53 PM.  History   Chief Complaint Chief Complaint  Patient presents with  . Neck Pain   The history is provided by the patient. No language interpreter was used.  HPI Comments: Stacy Kelley is a 25 y.o. female who presents to the Emergency Department complaining of gradually worsening neck pain onset 4 days ago. Pt reports that she was stretching when she felt a "pop" in her neck. She will have intermittently worsening pain with turning her head to the left. She has not taken anything for her symptoms. Patient denies neuro deficits, nausea/vomiting, or any other complaints.     Past Medical History:  Diagnosis Date  . Genital herpes   . Gonorrhea     Patient Active Problem List   Diagnosis Date Noted  . Generalized rash 11/23/2015  . NSVD (normal spontaneous vaginal delivery) 05/11/2015    Past Surgical History:  Procedure Laterality Date  . CESAREAN SECTION      OB History    Gravida Para Term Preterm AB Living   5 2 2   3 2    SAB TAB Ectopic Multiple Live Births   2 1   0 2       Home Medications    Prior to Admission medications   Medication Sig Start Date End Date Taking? Authorizing Provider  amoxicillin-clavulanate (AUGMENTIN) 875-125 MG tablet Take 1 tablet by mouth every 12 (twelve) hours. Patient not taking: Reported on 04/08/2016 03/28/16   Eyvonne MechanicJeffrey Hedges, PA-C  clindamycin (CLEOCIN) 150 MG capsule Take 3 capsules (450 mg total) by mouth 4 (four) times daily. 04/08/16   Bethel BornKelly Marie Gekas, PA-C  diphenhydrAMINE (BENADRYL) 25 MG tablet Take 1 tablet (25 mg total) by mouth every 6 (six) hours. 04/08/16   Bethel BornKelly Marie Gekas, PA-C  etonogestrel-ethinyl  estradiol (NUVARING) 0.12-0.015 MG/24HR vaginal ring Insert vaginally and leave in place for 3 consecutive weeks, then remove for 1 week. Patient not taking: Reported on 04/08/2016 11/22/15   Rachelle A Denney, CNM  methocarbamol (ROBAXIN) 500 MG tablet Take 1 tablet (500 mg total) by mouth 2 (two) times daily. 07/28/16   Amarilis Belflower C Meila Berke, PA-C  naproxen (NAPROSYN) 500 MG tablet Take 1 tablet (500 mg total) by mouth 2 (two) times daily. 07/28/16   Chandlor Noecker C Trea Carnegie, PA-C  predniSONE (DELTASONE) 20 MG tablet Take 2 tablets (40 mg total) by mouth daily. 04/08/16   Bethel BornKelly Marie Gekas, PA-C    Family History Family History  Problem Relation Age of Onset  . Diabetes Maternal Grandmother   . Diabetes Maternal Grandfather   . Diabetes Paternal Grandmother   . Hypertension Paternal Grandfather   . Diabetes Paternal Grandfather     Social History Social History  Substance Use Topics  . Smoking status: Never Smoker  . Smokeless tobacco: Never Used  . Alcohol use No     Allergies   Augmentin [amoxicillin-pot clavulanate]   Review of Systems Review of Systems  Constitutional: Negative for chills and fever.  Musculoskeletal: Positive for neck pain. Negative for back pain.  Neurological: Negative for weakness and numbness.  All other systems reviewed and are negative.  Physical Exam Updated Vital Signs BP 128/84 (BP Location: Right Arm)   Pulse 78  Temp 99 F (37.2 C) (Oral)   Resp 16   Ht 5\' 2"  (1.575 m)   Wt 119 lb (54 kg)   SpO2 100%   BMI 21.77 kg/m   Physical Exam  Constitutional: She appears well-developed and well-nourished. No distress.  HENT:  Head: Normocephalic and atraumatic.  Eyes: Conjunctivae and EOM are normal. Pupils are equal, round, and reactive to light.  Neck: Normal range of motion. Neck supple.  Cardiovascular: Normal rate, regular rhythm and intact distal pulses.   Pulmonary/Chest: Effort normal. No respiratory distress.  Abdominal: Soft. There is no tenderness.  There is no guarding.  Musculoskeletal: She exhibits no edema.  Increased pain with turning head to left, pain to the right cervical musculature with deep palpation. Full ROM in all extremities and spine. No midline spinal tenderness.   Neurological: She is alert.  No sensory deficits. Strength 5/5 in all extremities. No gait disturbance. Coordination intact. Cranial nerves III-XII grossly intact.   Skin: Skin is warm and dry. She is not diaphoretic.  Psychiatric: She has a normal mood and affect. Her behavior is normal.  Nursing note and vitals reviewed.  ED Treatments / Results  DIAGNOSTIC STUDIES: Oxygen Saturation is 100% on RA, normal by my interpretation.    COORDINATION OF CARE: 6:52 PM-Discussed treatment plan which includes anti-inflammatories with pt at bedside and pt agreed to plan.    Labs (all labs ordered are listed, but only abnormal results are displayed) Labs Reviewed - No data to display  EKG  EKG Interpretation None       Radiology No results found.  Procedures Procedures (including critical care time)  Medications Ordered in ED Medications - No data to display   Initial Impression / Assessment and Plan / ED Course  I have reviewed the triage vital signs and the nursing notes.  Pertinent labs & imaging results that were available during my care of the patient were reviewed by me and considered in my medical decision making (see chart for details).  Clinical Course    Patient presents with neck pain for the last 4 days. Possible cervical strain. No neurological or functional deficits. The patient was given instructions for home care as well as return precautions. Patient voices understanding of these instructions, accepts the plan, and is comfortable with discharge.   Final Clinical Impressions(s) / ED Diagnoses   Final diagnoses:  Acute strain of neck muscle, initial encounter     New Prescriptions Discharge Medication List as of 07/28/2016   6:55 PM    START taking these medications   Details  methocarbamol (ROBAXIN) 500 MG tablet Take 1 tablet (500 mg total) by mouth 2 (two) times daily., Starting Mon 07/28/2016, Print    naproxen (NAPROSYN) 500 MG tablet Take 1 tablet (500 mg total) by mouth 2 (two) times daily., Starting Mon 07/28/2016, Print       I personally performed the services described in this documentation, which was scribed in my presence. The recorded information has been reviewed and is accurate.    Anselm PancoastShawn C Aamya Orellana, PA-C 07/30/16 0127    Gerhard Munchobert Lockwood, MD 07/31/16 1739

## 2016-07-28 NOTE — Discharge Instructions (Signed)
Take it easy, but do not lay around too much as this may make the stiffness worse. Take 500 mg of naproxen every 12 hours or 800 mg of ibuprofen every 8 hours for the next 3 days. Take these medications with food to avoid upset stomach. Robaxin is a muscle relaxer and may help loosen stiff muscles. Do not take the Robaxin while driving or performing other dangerous activities.  Follow-up with a primary care provider for chronic management of this issue.

## 2016-07-30 ENCOUNTER — Emergency Department (HOSPITAL_COMMUNITY): Payer: Self-pay

## 2016-07-30 ENCOUNTER — Emergency Department (HOSPITAL_COMMUNITY)
Admission: EM | Admit: 2016-07-30 | Discharge: 2016-07-30 | Disposition: A | Discharge status: Home / Self Care | Payer: Self-pay | Attending: Emergency Medicine | Admitting: Emergency Medicine

## 2016-07-30 ENCOUNTER — Encounter (HOSPITAL_COMMUNITY): Payer: Self-pay | Admitting: *Deleted

## 2016-07-30 DIAGNOSIS — M542 Cervicalgia: Secondary | ICD-10-CM

## 2016-07-30 DIAGNOSIS — Y999 Unspecified external cause status: Secondary | ICD-10-CM | POA: Insufficient documentation

## 2016-07-30 DIAGNOSIS — X501XXA Overexertion from prolonged static or awkward postures, initial encounter: Secondary | ICD-10-CM | POA: Insufficient documentation

## 2016-07-30 DIAGNOSIS — Y9389 Activity, other specified: Secondary | ICD-10-CM | POA: Insufficient documentation

## 2016-07-30 DIAGNOSIS — M62838 Other muscle spasm: Secondary | ICD-10-CM | POA: Insufficient documentation

## 2016-07-30 DIAGNOSIS — Y929 Unspecified place or not applicable: Secondary | ICD-10-CM | POA: Insufficient documentation

## 2016-07-30 MED ORDER — ACETAMINOPHEN 325 MG PO TABS
650.0000 mg | ORAL_TABLET | Freq: Once | ORAL | Status: AC
  Administered 2016-07-30: 650 mg via ORAL
  Filled 2016-07-30: qty 2

## 2016-07-30 MED ORDER — KETOROLAC TROMETHAMINE 60 MG/2ML IM SOLN
60.0000 mg | Freq: Once | INTRAMUSCULAR | Status: AC
  Administered 2016-07-30: 60 mg via INTRAMUSCULAR
  Filled 2016-07-30: qty 2

## 2016-07-30 MED ORDER — DIAZEPAM 5 MG PO TABS
5.0000 mg | ORAL_TABLET | Freq: Once | ORAL | Status: DC
  Filled 2016-07-30: qty 1

## 2016-07-30 MED ORDER — OXYCODONE-ACETAMINOPHEN 5-325 MG PO TABS
1.0000 | ORAL_TABLET | Freq: Once | ORAL | Status: DC
  Filled 2016-07-30: qty 1

## 2016-07-30 NOTE — ED Triage Notes (Signed)
The pt is c/o neck pain  After she popped her neck last Thursday  When she popped her neck  She has been seen here for the same problem this past Monday  lmp oct

## 2016-07-30 NOTE — ED Provider Notes (Signed)
[facility]-EMERGENCY DEPT Provider Note   CSN: [id number] Arrival date & time: 07/30/16  0241   By signing my name below, I, Stacy Kelley, attest that this documentation has been prepared under the direction and in the presence of Azalia BilisKevin Tenzin Pavon, MD . Electronically Signed: Freida Busmaniana Kelley, Scribe. 07/30/2016. 3:58 AM.  History   Chief Complaint Chief Complaint  Patient presents with  . Torticollis     The history is provided by the patient. No language interpreter was used.    HPI Comments:  Stacy Kelley is a 25 y.o. female who presents to the Emergency Department complaining of constant, moderate right sided neck pain x ~6 days. She notes the pain began after she stretched and felt a pop. She has applied heat and massaged the site without relief. Pt was seen in the ED on 07/28/16 for this pain and discharged with Robaxin and Naprosyn. Pt has no other complaints or symptoms at this time; no weakness of her BUE.   Past Medical History:  Diagnosis Date  . Genital herpes   . Gonorrhea     Patient Active Problem List   Diagnosis Date Noted  . Generalized rash 11/23/2015  . NSVD (normal spontaneous vaginal delivery) 05/11/2015    Past Surgical History:  Procedure Laterality Date  . CESAREAN SECTION      OB History    Gravida Para Term Preterm AB Living   5 2 2   3 2    SAB TAB Ectopic Multiple Live Births   2 1   0 2       Home Medications    Prior to Admission medications   Medication Sig Start Date End Date Taking? Authorizing Provider  amoxicillin-clavulanate (AUGMENTIN) 875-125 MG tablet Take 1 tablet by mouth every 12 (twelve) hours. Patient not taking: Reported on 04/08/2016 03/28/16   Eyvonne MechanicJeffrey Hedges, PA-C  clindamycin (CLEOCIN) 150 MG capsule Take 3 capsules (450 mg total) by mouth 4 (four) times daily. 04/08/16   Bethel BornKelly Marie Gekas, PA-C  diphenhydrAMINE (BENADRYL) 25 MG tablet Take 1 tablet (25 mg total) by mouth every 6 (six) hours. 04/08/16   Bethel BornKelly Marie Gekas, PA-C    etonogestrel-ethinyl estradiol (NUVARING) 0.12-0.015 MG/24HR vaginal ring Insert vaginally and leave in place for 3 consecutive weeks, then remove for 1 week. Patient not taking: Reported on 04/08/2016 11/22/15   Rachelle A Denney, CNM  methocarbamol (ROBAXIN) 500 MG tablet Take 1 tablet (500 mg total) by mouth 2 (two) times daily. 07/28/16   Shawn C Joy, PA-C  naproxen (NAPROSYN) 500 MG tablet Take 1 tablet (500 mg total) by mouth 2 (two) times daily. 07/28/16   Shawn C Joy, PA-C  predniSONE (DELTASONE) 20 MG tablet Take 2 tablets (40 mg total) by mouth daily. 04/08/16   Bethel BornKelly Marie Gekas, PA-C    Family History Family History  Problem Relation Age of Onset  . Diabetes Maternal Grandmother   . Diabetes Maternal Grandfather   . Diabetes Paternal Grandmother   . Hypertension Paternal Grandfather   . Diabetes Paternal Grandfather     Social History Social History  Substance Use Topics  . Smoking status: Never Smoker  . Smokeless tobacco: Never Used  . Alcohol use No     Allergies   Augmentin [amoxicillin-pot clavulanate]   Review of Systems Review of Systems 10 systems reviewed and all are negative for acute change except as noted in the HPI.  Physical Exam Updated Vital Signs BP 103/65 (BP Location: Left Arm)   Pulse 76  Temp 98 F (36.7 C) (Oral)   Resp 19   LMP 07/16/2016   SpO2 100%   Physical Exam  Constitutional: She is oriented to person, place, and time. She appears well-developed and well-nourished.  HENT:  Head: Normocephalic.  Eyes: EOM are normal.  Neck: Normal range of motion.  No c-spine tenderness Mild paracervical  tenderness R>L with spasm  FROM of neck   Pulmonary/Chest: Effort normal.  Abdominal: She exhibits no distension.  Musculoskeletal: Normal range of motion.  Neurological: She is alert and oriented to person, place, and time.  Psychiatric: She has a normal mood and affect.  Nursing note and vitals reviewed.    ED Treatments /  Results  DIAGNOSTIC STUDIES:  Oxygen Saturation is 100% on RA, normal by my interpretation.    COORDINATION OF CARE:  3:55 AM Discussed treatment plan with pt at bedside and pt agreed to plan.  Labs (all labs ordered are listed, but only abnormal results are displayed) Labs Reviewed - No data to display  EKG  EKG Interpretation None       Radiology No results found.  Procedures Procedures (including critical care time)  Medications Ordered in ED Medications - No data to display   Initial Impression / Assessment and Plan / ED Course  I have reviewed the triage vital signs and the nursing notes.  Pertinent labs & imaging results that were available during my care of the patient were reviewed by me and considered in my medical decision making (see chart for details).  Clinical Course    Likely cervical strain.  No weakness of her upper extremity as.  Plain film without acute osseous injury.  Discharge home with ongoing treatment with anti-inflammatories and muscle relaxants as well as heat.  Patient was offered Percocet and Valium in the emergency department but she would prefer to go home and drive home.   Final Clinical Impressions(s) / ED Diagnoses   Final diagnoses:  Neck pain    New Prescriptions New Prescriptions   No medications on file   I personally performed the services described in this documentation, which was scribed in my presence. The recorded information has been reviewed and is accurate.        Azalia BilisKevin Rillie Riffel, MD 07/30/16 [phone]

## 2017-03-21 ENCOUNTER — Emergency Department (HOSPITAL_COMMUNITY)
Admission: EM | Admit: 2017-03-21 | Discharge: 2017-03-21 | Disposition: A | Discharge status: Home / Self Care | Payer: Medicaid Other | Attending: Emergency Medicine | Admitting: Emergency Medicine

## 2017-03-21 ENCOUNTER — Encounter (HOSPITAL_COMMUNITY): Payer: Self-pay | Admitting: Emergency Medicine

## 2017-03-21 DIAGNOSIS — S161XXA Strain of muscle, fascia and tendon at neck level, initial encounter: Secondary | ICD-10-CM | POA: Insufficient documentation

## 2017-03-21 DIAGNOSIS — M549 Dorsalgia, unspecified: Secondary | ICD-10-CM | POA: Insufficient documentation

## 2017-03-21 DIAGNOSIS — Y999 Unspecified external cause status: Secondary | ICD-10-CM | POA: Insufficient documentation

## 2017-03-21 DIAGNOSIS — Y92410 Unspecified street and highway as the place of occurrence of the external cause: Secondary | ICD-10-CM | POA: Insufficient documentation

## 2017-03-21 DIAGNOSIS — Z880 Allergy status to penicillin: Secondary | ICD-10-CM | POA: Insufficient documentation

## 2017-03-21 DIAGNOSIS — Y939 Activity, unspecified: Secondary | ICD-10-CM | POA: Insufficient documentation

## 2017-03-21 MED ORDER — DICLOFENAC SODIUM 50 MG PO TBEC: 50.0000 mg | DELAYED_RELEASE_TABLET | Freq: Two times a day (BID) | ORAL | 0 refills | Status: DC

## 2017-03-21 MED ORDER — CYCLOBENZAPRINE HCL 5 MG PO TABS: 5.0000 mg | ORAL_TABLET | Freq: Three times a day (TID) | ORAL | 0 refills | Status: DC | PRN
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
## 2017-03-21 NOTE — Discharge Instructions (Signed)
Do not drive while taking the muscle relaxant as it will make you sleepy. Follow up with your doctor or return here as needed.  °
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
## 2018-02-04 ENCOUNTER — Encounter (HOSPITAL_COMMUNITY): Payer: Self-pay | Admitting: Emergency Medicine

## 2018-02-04 ENCOUNTER — Emergency Department (HOSPITAL_COMMUNITY)
Admission: EM | Admit: 2018-02-04 | Discharge: 2018-02-04 | Disposition: A | Discharge status: Home / Self Care | Payer: Self-pay | Attending: Emergency Medicine | Admitting: Emergency Medicine

## 2018-02-04 ENCOUNTER — Emergency Department (HOSPITAL_COMMUNITY): Payer: Self-pay

## 2018-02-04 ENCOUNTER — Other Ambulatory Visit: Payer: Self-pay

## 2018-02-04 DIAGNOSIS — Z79899 Other long term (current) drug therapy: Secondary | ICD-10-CM | POA: Insufficient documentation

## 2018-02-04 DIAGNOSIS — M25521 Pain in right elbow: Secondary | ICD-10-CM

## 2018-02-04 DIAGNOSIS — M542 Cervicalgia: Secondary | ICD-10-CM

## 2018-02-04 MED ORDER — IBUPROFEN 800 MG PO TABS: 800.0000 mg | ORAL_TABLET | Freq: Three times a day (TID) | ORAL | 0 refills | Status: DC | PRN

## 2018-02-04 MED ORDER — CYCLOBENZAPRINE HCL 10 MG PO TABS: 10.0000 mg | ORAL_TABLET | Freq: Two times a day (BID) | ORAL | 0 refills | Status: DC | PRN

## 2018-02-04 NOTE — ED Notes (Signed)
Patient transported to X-ray 
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
## 2018-02-04 NOTE — ED Notes (Signed)
Bed: WA09 Expected date:  Expected time:  Means of arrival:  Comments: 

## 2018-02-04 NOTE — Discharge Instructions (Addendum)
You were evaluated in the emergency department for ongoing neck and right elbow pain.  I think this is most likely muscular but your x-ray did note that there was some swelling in the joint and you may need an MRI.  We are treating you with some anti-inflammatories and some spasm medicine.  Please contact community health and wellness to set up to get up primary care doctor and return to the emergency department if any worsening symptoms.
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
## 2018-03-23 ENCOUNTER — Emergency Department (HOSPITAL_COMMUNITY)
Admission: EM | Admit: 2018-03-23 | Discharge: 2018-03-24 | Disposition: A | Discharge status: Home / Self Care | Payer: Self-pay | Attending: Emergency Medicine | Admitting: Emergency Medicine

## 2018-03-23 ENCOUNTER — Encounter (HOSPITAL_COMMUNITY): Payer: Self-pay | Admitting: Emergency Medicine

## 2018-03-23 ENCOUNTER — Other Ambulatory Visit: Payer: Self-pay

## 2018-03-23 DIAGNOSIS — R51 Headache: Secondary | ICD-10-CM

## 2018-03-23 DIAGNOSIS — K004 Disturbances in tooth formation: Secondary | ICD-10-CM | POA: Insufficient documentation

## 2018-03-23 DIAGNOSIS — Z79899 Other long term (current) drug therapy: Secondary | ICD-10-CM | POA: Insufficient documentation

## 2018-03-23 DIAGNOSIS — R519 Headache, unspecified: Secondary | ICD-10-CM

## 2018-03-23 NOTE — ED Triage Notes (Signed)
Pt arriving with left sided facial pain that began earlier today. Pt states it is hard to talk, and it feels like her face is getting tighter. Pt denies any injury that could have caused symptoms.

## 2018-03-24 ENCOUNTER — Encounter (HOSPITAL_COMMUNITY): Payer: Self-pay | Admitting: Emergency Medicine

## 2018-03-24 MED ORDER — ACETAMINOPHEN 500 MG PO TABS
1000.0000 mg | ORAL_TABLET | Freq: Once | ORAL | Status: AC
  Administered 2018-03-24: 1000 mg via ORAL
  Filled 2018-03-24: qty 2

## 2018-03-24 MED ORDER — IBUPROFEN 800 MG PO TABS: 800.0000 mg | ORAL_TABLET | Freq: Three times a day (TID) | ORAL | 0 refills | Status: DC

## 2018-03-24 MED ORDER — CLINDAMYCIN HCL 300 MG PO CAPS: 300.0000 mg | ORAL_CAPSULE | Freq: Four times a day (QID) | ORAL | 0 refills | Status: DC

## 2018-03-24 MED ORDER — CLINDAMYCIN HCL 300 MG PO CAPS
300.0000 mg | ORAL_CAPSULE | Freq: Once | ORAL | Status: AC
  Administered 2018-03-24: 300 mg via ORAL
  Filled 2018-03-24: qty 1

## 2018-03-24 MED ORDER — IBUPROFEN 800 MG PO TABS
800.0000 mg | ORAL_TABLET | Freq: Once | ORAL | Status: AC
  Administered 2018-03-24: 800 mg via ORAL
  Filled 2018-03-24: qty 1

## 2018-03-24 NOTE — ED Provider Notes (Signed)
[facility]-EMERGENCY DEPT Provider Note   CSN: [id number] Arrival date & time: 03/23/18  2241     History   Chief Complaint Chief Complaint  Patient presents with  . Facial Pain    left side    HPI Stacy Kelley is a 27 y.o. female.  The history is provided by the patient.  Illness  This is a new problem. The current episode started yesterday. The problem occurs constantly. The problem has not changed since onset.Pertinent negatives include no chest pain, no abdominal pain, no headaches and no shortness of breath. Nothing aggravates the symptoms. Nothing relieves the symptoms. She has tried nothing for the symptoms. The treatment provided no relief.  Left lower facial pain x 24 hours. States it feels swollen but does not appear so.  No change in voice no trismus.  No f/c/r.  No trauma.  No neck pain or stiffness.  No rashes on the skin.    Past Medical History:  Diagnosis Date  . Genital herpes   . Gonorrhea     Patient Active Problem List   Diagnosis Date Noted  . Generalized rash 11/23/2015  . NSVD (normal spontaneous vaginal delivery) 05/11/2015    Past Surgical History:  Procedure Laterality Date  . CESAREAN SECTION       OB History    Gravida  5   Para  2   Term  2   Preterm      AB  3   Living  2     SAB  2   TAB  1   Ectopic      Multiple  0   Live Births  2            Home Medications    Prior to Admission medications   Medication Sig Start Date End Date Taking? Authorizing Provider  amoxicillin-clavulanate (AUGMENTIN) 875-125 MG tablet Take 1 tablet by mouth every 12 (twelve) hours. Patient not taking: Reported on 04/08/2016 03/28/16   Hedges, Tinnie Gens, PA-C  clindamycin (CLEOCIN) 150 MG capsule Take 3 capsules (450 mg total) by mouth 4 (four) times daily. Patient not taking: Reported on 02/04/2018 04/08/16   Bethel Born, PA-C  cyclobenzaprine (FLEXERIL) 10 MG tablet Take 1 tablet (10 mg total) by mouth 2  (two) times daily as needed for muscle spasms. 02/04/18   Terrilee Files, MD  diclofenac (VOLTAREN) 50 MG EC tablet Take 1 tablet (50 mg total) by mouth 2 (two) times daily. Patient not taking: Reported on 02/04/2018 03/21/17   Janne Napoleon, NP  diphenhydrAMINE (BENADRYL) 25 MG tablet Take 1 tablet (25 mg total) by mouth every 6 (six) hours. Patient taking differently: Take 25 mg by mouth every 6 (six) hours as needed for allergies.  04/08/16   Bethel Born, PA-C  etonogestrel-ethinyl estradiol (NUVARING) 0.12-0.015 MG/24HR vaginal ring Insert vaginally and leave in place for 3 consecutive weeks, then remove for 1 week. Patient not taking: Reported on 04/08/2016 11/22/15   Orvilla Cornwall A, CNM  ibuprofen (ADVIL,MOTRIN) 800 MG tablet Take 1 tablet (800 mg total) by mouth every 8 (eight) hours as needed. 02/04/18   Terrilee Files, MD  methocarbamol (ROBAXIN) 500 MG tablet Take 1 tablet (500 mg total) by mouth 2 (two) times daily. Patient not taking: Reported on 02/04/2018 07/28/16   Joy, Shawn C, PA-C  naproxen (NAPROSYN) 500 MG tablet Take 1 tablet (500 mg total) by mouth 2 (two) times daily. 07/28/16   Harolyn Rutherford  C, PA-C  predniSONE (DELTASONE) 20 MG tablet Take 2 tablets (40 mg total) by mouth daily. Patient not taking: Reported on 02/04/2018 04/08/16   Bethel Born, PA-C    Family History Family History  Problem Relation Age of Onset  . Diabetes Maternal Grandmother   . Diabetes Maternal Grandfather   . Diabetes Paternal Grandmother   . Hypertension Paternal Grandfather   . Diabetes Paternal Grandfather     Social History Social History   Tobacco Use  . Smoking status: Never Smoker  . Smokeless tobacco: Never Used  Substance Use Topics  . Alcohol use: No    Alcohol/week: 0.0 oz  . Drug use: No     Allergies   Augmentin [amoxicillin-pot clavulanate]   Review of Systems Review of Systems  Constitutional: Negative for chills, diaphoresis and fever.  HENT: Negative  for facial swelling, mouth sores, sneezing, sore throat, tinnitus, trouble swallowing and voice change.   Respiratory: Negative for shortness of breath.   Cardiovascular: Negative for chest pain.  Gastrointestinal: Negative for abdominal pain.  Musculoskeletal: Negative for neck pain and neck stiffness.  Neurological: Negative for headaches.  All other systems reviewed and are negative.    Physical Exam Updated Vital Signs BP 123/78 (BP Location: Right Arm)   Pulse 88   Temp 98.3 F (36.8 C) (Oral)   Resp 18   Ht 5\' 3"  (1.6 m)   Wt 52.2 kg (115 lb)   LMP 03/09/2018   SpO2 99%   BMI 20.37 kg/m   Physical Exam  Constitutional: She is oriented to person, place, and time. She appears well-developed and well-nourished. No distress.  HENT:  Head: Normocephalic and atraumatic.  Right Ear: External ear normal.  Left Ear: External ear normal.  Nose: Nose normal.  Mouth/Throat: Oropharynx is clear and moist. No oropharyngeal exudate.  No trismus no swelling of the parotids, no swelling of the submandibular glands no facial swelling intact phonation no pain with displacement of the larynx.  No swelling of the lips tongue or uvula.  Mallempati class 1  Dental caries of the left lower second molar  Eyes: Pupils are equal, round, and reactive to light. Conjunctivae are normal.  Neck: Normal range of motion. Neck supple. No tracheal deviation present.  Cardiovascular: Normal rate, regular rhythm, normal heart sounds and intact distal pulses.  Pulmonary/Chest: Effort normal and breath sounds normal. No stridor. She has no wheezes. She has no rales.  Abdominal: Soft. Bowel sounds are normal. She exhibits no mass. There is no tenderness. There is no rebound and no guarding.  Musculoskeletal: Normal range of motion.  Lymphadenopathy:    She has no cervical adenopathy.  Neurological: She is alert and oriented to person, place, and time.  Skin: Skin is warm and dry. Capillary refill takes less  than 2 seconds.  Psychiatric: She has a normal mood and affect.  Nursing note and vitals reviewed.    ED Treatments / Results   Procedures Procedures (including critical care time)  Medications Ordered in ED Medications  ibuprofen (ADVIL,MOTRIN) tablet 800 mg (has no administration in time range)  acetaminophen (TYLENOL) tablet 1,000 mg (has no administration in time range)  clindamycin (CLEOCIN) capsule 300 mg (has no administration in time range)     Final Clinical Impressions(s) / ED Diagnoses  Follow up with your dentist this week. Take all medications as directed.     Return for weakness, numbness, changes in vision or speech, fevers >100.4 unrelieved by medication, shortness of breath, intractable  vomiting, or diarrhea, abdominal pain, Inability to tolerate liquids or food, cough, altered mental status or any concerns. No signs of systemic illness or infection. The patient is nontoxic-appearing on exam and vital signs are within normal limits.   I have reviewed the triage vital signs and the nursing notes. Pertinent labs &imaging results that were available during my care of the patient were reviewed by me and considered in my medical decision making (see chart for details).  After history, exam, and medical workup I feel the patient has been appropriately medically screened and is safe for discharge home. Pertinent diagnoses were discussed with the patient. Patient was given return precautions.    Thoma Paulsen, MD 03/24/18 0020

## 2018-05-30 ENCOUNTER — Emergency Department (HOSPITAL_COMMUNITY): Payer: Medicaid Other

## 2018-05-30 ENCOUNTER — Emergency Department (HOSPITAL_COMMUNITY)
Admission: EM | Admit: 2018-05-30 | Discharge: 2018-05-30 | Disposition: A | Discharge status: Home / Self Care | Payer: Medicaid Other | Attending: Emergency Medicine | Admitting: Emergency Medicine

## 2018-05-30 ENCOUNTER — Other Ambulatory Visit: Payer: Self-pay

## 2018-05-30 ENCOUNTER — Encounter (HOSPITAL_COMMUNITY): Payer: Self-pay | Admitting: Emergency Medicine

## 2018-05-30 DIAGNOSIS — Y999 Unspecified external cause status: Secondary | ICD-10-CM | POA: Insufficient documentation

## 2018-05-30 DIAGNOSIS — Y929 Unspecified place or not applicable: Secondary | ICD-10-CM | POA: Insufficient documentation

## 2018-05-30 DIAGNOSIS — X500XXA Overexertion from strenuous movement or load, initial encounter: Secondary | ICD-10-CM | POA: Diagnosis not present

## 2018-05-30 DIAGNOSIS — S60945A Unspecified superficial injury of left ring finger, initial encounter: Secondary | ICD-10-CM | POA: Diagnosis present

## 2018-05-30 DIAGNOSIS — S63635A Sprain of interphalangeal joint of left ring finger, initial encounter: Secondary | ICD-10-CM | POA: Insufficient documentation

## 2018-05-30 DIAGNOSIS — Y9389 Activity, other specified: Secondary | ICD-10-CM | POA: Diagnosis not present

## 2018-05-30 MED ORDER — IBUPROFEN 800 MG PO TABS
800.0000 mg | ORAL_TABLET | Freq: Once | ORAL | Status: AC
  Administered 2018-05-30: 800 mg via ORAL
  Filled 2018-05-30: qty 1

## 2018-05-30 MED ORDER — ACETAMINOPHEN 500 MG PO TABS
1000.0000 mg | ORAL_TABLET | Freq: Once | ORAL | Status: AC
Start: 2018-05-30 — End: 2018-05-30
  Administered 2018-05-30: 1000 mg via ORAL
  Filled 2018-05-30: qty 2

## 2018-05-30 NOTE — Discharge Instructions (Signed)
Follow up with your PCP.  You can buddy tape to the fingers next to it for support.
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
## 2018-05-30 NOTE — ED Notes (Signed)
Patient verbalizes understanding of discharge instructions. Opportunity for questioning and answers were provided. Pt discharged from ED. 

## 2018-07-12 DIAGNOSIS — L819 Disorder of pigmentation, unspecified: Secondary | ICD-10-CM | POA: Insufficient documentation

## 2018-10-07 ENCOUNTER — Other Ambulatory Visit: Payer: Self-pay | Admitting: Oral Surgery

## 2019-11-11 ENCOUNTER — Other Ambulatory Visit: Payer: Self-pay

## 2019-11-11 ENCOUNTER — Ambulatory Visit (HOSPITAL_COMMUNITY)
Admission: EM | Admit: 2019-11-11 | Discharge: 2019-11-11 | Disposition: A | Discharge status: Home / Self Care | Payer: Medicaid Other | Attending: Family Medicine | Admitting: Family Medicine

## 2019-11-11 ENCOUNTER — Encounter (HOSPITAL_COMMUNITY): Payer: Self-pay

## 2019-11-11 DIAGNOSIS — L03213 Periorbital cellulitis: Secondary | ICD-10-CM

## 2019-11-11 MED ORDER — CEFDINIR 300 MG PO CAPS: 300.0000 mg | ORAL_CAPSULE | Freq: Two times a day (BID) | ORAL | 0 refills | Status: AC

## 2019-11-11 NOTE — ED Provider Notes (Signed)
[place]    CSN: [id number] Arrival date & time: 11/11/19  2637      History   Chief Complaint Chief Complaint  Patient presents with  . Eye Swelling    HPI Stacy Kelley is a 29 y.o. female.   Patient is a 29 year old female presents today with right eye swelling that worsened yesterday.  Reporting for the past couple days she has had some generalized upper lid discomfort but no swelling until yesterday.  Tenderness around the eye.  Denies any injuries to the eye, fevers, significant pain.  She has been doing warm compresses to the eye.  ROS per HPI      Past Medical History:  Diagnosis Date  . Genital herpes   . Gonorrhea     Patient Active Problem List   Diagnosis Date Noted  . Generalized rash 11/23/2015  . NSVD (normal spontaneous vaginal delivery) 05/11/2015    Past Surgical History:  Procedure Laterality Date  . CESAREAN SECTION      OB History    Gravida  5   Para  2   Term  2   Preterm      AB  3   Living  2     SAB  2   TAB  1   Ectopic      Multiple  0   Live Births  2            Home Medications    Prior to Admission medications   Medication Sig Start Date End Date Taking? Authorizing Provider  cefdinir (OMNICEF) 300 MG capsule Take 1 capsule (300 mg total) by mouth 2 (two) times daily for 10 days. 11/11/19 11/21/19  Loura Halt A, NP  erythromycin ophthalmic ointment Place a 1/2 inch ribbon of ointment into the right lower eyelid., twice daily x 5 days 11/12/19   Scot Jun, FNP  diphenhydrAMINE (BENADRYL) 25 MG tablet Take 1 tablet (25 mg total) by mouth every 6 (six) hours. Patient not taking: Reported on 05/30/2018 04/08/16 11/11/19  Recardo Evangelist, PA-C  etonogestrel-ethinyl estradiol (NUVARING) 0.12-0.015 MG/24HR vaginal ring Insert vaginally and leave in place for 3 consecutive weeks, then remove for 1 week. Patient not taking: Reported on 04/08/2016 11/22/15 11/11/19  Morene Crocker, CNM     Family History Family History  Problem Relation Age of Onset  . Diabetes Maternal Grandmother   . Diabetes Maternal Grandfather   . Diabetes Paternal Grandmother   . Hypertension Paternal Grandfather   . Diabetes Paternal Grandfather     Social History Social History   Tobacco Use  . Smoking status: Never Smoker  . Smokeless tobacco: Never Used  Substance Use Topics  . Alcohol use: No    Alcohol/week: 0.0 standard drinks  . Drug use: No     Allergies   Augmentin [amoxicillin-pot clavulanate]   Review of Systems Review of Systems   Physical Exam Triage Vital Signs ED Triage Vitals  Enc Vitals Group     BP 11/11/19 1020 125/80     Pulse Rate 11/11/19 1020 85     Resp 11/11/19 1020 18     Temp 11/11/19 1020 99.2 F (37.3 C)     Temp Source 11/11/19 1020 Oral     SpO2 11/11/19 1020 98 %     Weight --      Height --      Head Circumference --      Peak Flow --  Pain Score 11/11/19 1019 5     Pain Loc --      Pain Edu? --      Excl. in GC? --    No data found.  Updated Vital Signs BP 125/80 (BP Location: Right Arm)   Pulse 85   Temp 99.2 F (37.3 C) (Oral)   Resp 18   SpO2 98%   Visual Acuity Right Eye Distance:   Left Eye Distance:   Bilateral Distance:    Right Eye Near:   Left Eye Near:    Bilateral Near:     Physical Exam Vitals and nursing note reviewed.  Constitutional:      General: She is not in acute distress.    Appearance: Normal appearance. She is not ill-appearing, toxic-appearing or diaphoretic.  HENT:     Head: Normocephalic.     Nose: Nose normal.     Mouth/Throat:     Pharynx: Oropharynx is clear.  Eyes:     Extraocular Movements: Extraocular movements intact.     Conjunctiva/sclera: Conjunctivae normal.     Pupils: Pupils are equal, round, and reactive to light.     Comments: See picture for detail.  No orbital tenderness or scleral injection.  Pulmonary:     Effort: Pulmonary effort is normal.   Musculoskeletal:        General: Normal range of motion.     Cervical back: Normal range of motion.  Skin:    General: Skin is warm and dry.     Findings: No rash.  Neurological:     Mental Status: She is alert.  Psychiatric:        Mood and Affect: Mood normal.        UC Treatments / Results  Labs (all labs ordered are listed, but only abnormal results are displayed) Labs Reviewed - No data to display  EKG   Radiology No results found.  Procedures Procedures (including critical care time)  Medications Ordered in UC Medications - No data to display  Initial Impression / Assessment and Plan / UC Course  I have reviewed the triage vital signs and the nursing notes.  Pertinent labs & imaging results that were available during my care of the patient were reviewed by me and considered in my medical decision making (see chart for details).     Preseptal cellulitis- treating with cefdinir Warm compresses. Strict return precautions given.  Follow up as needed for continued or worsening symptoms  Final Clinical Impressions(s) / UC Diagnoses   Final diagnoses:  Preseptal cellulitis of right eye     Discharge Instructions     Treating you for preseptal cellulitis.  Information given in your packet about this.  Take the antibiotics twice a day for the next 10 days.  If your symptoms worsen to include more severe swelling or pain over the next day or 2 you need to follow-up.     ED Prescriptions    Medication Sig Dispense Auth. Provider   cefdinir (OMNICEF) 300 MG capsule Take 1 capsule (300 mg total) by mouth 2 (two) times daily for 10 days. 20 capsule Dahlia Byes A, NP     PDMP not reviewed this encounter.   Dahlia Byes A, NP 11/13/19 1020

## 2019-11-11 NOTE — ED Triage Notes (Signed)
Pt presents with right eye swelling since yesterday; pt states the pain with the eye started 3 days ago.

## 2019-11-11 NOTE — Discharge Instructions (Signed)
Treating you for preseptal cellulitis.  Information given in your packet about this.  Take the antibiotics twice a day for the next 10 days.  If your symptoms worsen to include more severe swelling or pain over the next day or 2 you need to follow-up.

## 2019-11-12 ENCOUNTER — Ambulatory Visit (HOSPITAL_COMMUNITY)
Admission: EM | Admit: 2019-11-12 | Discharge: 2019-11-12 | Disposition: A | Discharge status: Home / Self Care | Payer: Medicaid Other | Attending: Family Medicine | Admitting: Family Medicine

## 2019-11-12 ENCOUNTER — Other Ambulatory Visit: Payer: Self-pay

## 2019-11-12 ENCOUNTER — Encounter (HOSPITAL_COMMUNITY): Payer: Self-pay

## 2019-11-12 DIAGNOSIS — H01001 Unspecified blepharitis right upper eyelid: Secondary | ICD-10-CM

## 2019-11-12 DIAGNOSIS — H1131 Conjunctival hemorrhage, right eye: Secondary | ICD-10-CM

## 2019-11-12 MED ORDER — ERYTHROMYCIN 5 MG/GM OP OINT: TOPICAL_OINTMENT | OPHTHALMIC | 0 refills | Status: DC

## 2019-11-12 NOTE — ED Provider Notes (Signed)
[place]    CSN: [id number] Arrival date & time: 11/12/19  1319      History   Chief Complaint Chief Complaint  Patient presents with  . Eye Problem    HPI Stacy Kelley is a 29 y.o. female.   HPI  Patient presents today for evaluation of a blood vessel rupture of the right eye.  Patient seen here at urgent care yesterday and treated for blepharitis of the right upper eyelid.  She reports taking 2 doses of medication and swelling has improved with the antibiotics prescribed yesterday.  She grew concerned as she developed a red linear mark of the right.  She denies rubbing eye and has been applying warm compresses to the multiple times a day.  She denies a sensation of any foreign body.  Her vision has not changed.  Past Medical History:  Diagnosis Date  . Genital herpes   . Gonorrhea     Patient Active Problem List   Diagnosis Date Noted  . Generalized rash 11/23/2015  . NSVD (normal spontaneous vaginal delivery) 05/11/2015    Past Surgical History:  Procedure Laterality Date  . CESAREAN SECTION      OB History    Gravida  5   Para  2   Term  2   Preterm      AB  3   Living  2     SAB  2   TAB  1   Ectopic      Multiple  0   Live Births  2            Home Medications    Prior to Admission medications   Medication Sig Start Date End Date Taking? Authorizing Provider  cefdinir (OMNICEF) 300 MG capsule Take 1 capsule (300 mg total) by mouth 2 (two) times daily for 10 days. 11/11/19 11/21/19  Loura Halt A, NP  diphenhydrAMINE (BENADRYL) 25 MG tablet Take 1 tablet (25 mg total) by mouth every 6 (six) hours. Patient not taking: Reported on 05/30/2018 04/08/16 11/11/19  Recardo Evangelist, PA-C  etonogestrel-ethinyl estradiol (NUVARING) 0.12-0.015 MG/24HR vaginal ring Insert vaginally and leave in place for 3 consecutive weeks, then remove for 1 week. Patient not taking: Reported on 04/08/2016 11/22/15 11/11/19  Morene Crocker, CNM     Family History Family History  Problem Relation Age of Onset  . Diabetes Maternal Grandmother   . Diabetes Maternal Grandfather   . Diabetes Paternal Grandmother   . Hypertension Paternal Grandfather   . Diabetes Paternal Grandfather     Social History Social History   Tobacco Use  . Smoking status: Never Smoker  . Smokeless tobacco: Never Used  Substance Use Topics  . Alcohol use: No    Alcohol/week: 0.0 standard drinks  . Drug use: No     Allergies   Augmentin [amoxicillin-pot clavulanate]   Review of Systems Review of Systems Pertinent negatives listed in HPI  Physical Exam Triage Vital Signs ED Triage Vitals  Enc Vitals Group     BP 11/12/19 1347 121/81     Pulse Rate 11/12/19 1347 64     Resp 11/12/19 1347 16     Temp 11/12/19 1347 98.6 F (37 C)     Temp Source 11/12/19 1347 Oral     SpO2 11/12/19 1347 100 %     Weight --      Height --      Head Circumference --      Peak Flow --  Pain Score 11/12/19 1348 4     Pain Loc --      Pain Edu? --      Excl. in GC? --    No data found.  Updated Vital Signs BP 121/81 (BP Location: Right Arm)   Pulse 64   Temp 98.6 F (37 C) (Oral)   Resp 16   SpO2 100%   Visual Acuity Right Eye Distance:   Left Eye Distance:   Bilateral Distance:    Right Eye Near: R Near: 20/20 Left Eye Near:  L Near: 20/20 Bilateral Near:  20/20  Physical Exam Eyes:     General: Lids are everted, no foreign bodies appreciated.        Right eye: No discharge.     Conjunctiva/sclera:     Right eye: Hemorrhage present. No exudate.     Comments: Upper eyelid edema and tenderness with palpation along the upper eye brow line   Cardiovascular:     Rate and Rhythm: Normal rate and regular rhythm.  Pulmonary:     Effort: Pulmonary effort is normal.     Breath sounds: Normal breath sounds.  Musculoskeletal:     Cervical back: Normal range of motion.  Skin:    General: Skin is warm.  Neurological:     Mental  Status: She is alert and oriented to person, place, and time.  Psychiatric:        Attention and Perception: Attention normal.    UC Treatments / Results  Labs (all labs ordered are listed, but only abnormal results are displayed) Labs Reviewed - No data to display  EKG   Radiology No results found.  Procedures Procedures (including critical care time)  Medications Ordered in UC Medications - No data to display  Initial Impression / Assessment and Plan / UC Course  I have reviewed the triage vital signs and the nursing notes.  Pertinent labs & imaging results that were available during my care of the patient were reviewed by me and considered in my medical decision making (see chart for details).     Subconjunctival hemorrhage of right eye Blepharitis of right upper eyelid, unspecified type  Continue Omnicef and complete remaining doses. I have added erythromycin applied to the lower eyelid of the right eye twice daily for 5 days.  Red flags discussed.  Courage patient to continue warm compresses and avoid rubbing eye.  Final Clinical Impressions(s) / UC Diagnoses   Final diagnoses:  Subconjunctival hemorrhage of right eye  Blepharitis of right upper eyelid, unspecified type     Discharge Instructions     Continue Omnicef for blepharitis which is infection of the right upper eyelid For subconjunctival hemorrhage I am adding erythromycin eye ointment.  Apply to twice per day x5 days. You to apply warm compresses multiple times throughout the day to reduce swelling.  If vision loss or eye swelling increases go immediately to ER for further evaluation.    ED Prescriptions    Medication Sig Dispense Auth. Provider   erythromycin ophthalmic ointment Place a 1/2 inch ribbon of ointment into the right lower eyelid., twice daily x 5 days 1 g Bing Neighbors, FNP     PDMP not reviewed this encounter.   Bing Neighbors, FNP 11/12/19 1428

## 2019-11-12 NOTE — ED Triage Notes (Signed)
Pt states she was just here for her right eye swelling . Pt states it's getting worst. Pt has a broken blood vessel in her right eye now. That happen yesterday.

## 2019-11-12 NOTE — Discharge Instructions (Addendum)
Continue Omnicef for blepharitis which is infection of the right upper eyelid For subconjunctival hemorrhage I am adding erythromycin eye ointment.  Apply to twice per day x5 days. You to apply warm compresses multiple times throughout the day to reduce swelling.  If vision loss or eye swelling increases go immediately to ER for further evaluation.

## 2020-04-10 ENCOUNTER — Encounter (HOSPITAL_COMMUNITY): Payer: Self-pay

## 2020-04-10 ENCOUNTER — Emergency Department (HOSPITAL_COMMUNITY)
Admission: EM | Admit: 2020-04-10 | Discharge: 2020-04-10 | Disposition: A | Discharge status: Home / Self Care | Payer: Medicaid Other | Attending: Emergency Medicine | Admitting: Emergency Medicine

## 2020-04-10 ENCOUNTER — Other Ambulatory Visit: Payer: Self-pay

## 2020-04-10 DIAGNOSIS — J029 Acute pharyngitis, unspecified: Secondary | ICD-10-CM | POA: Diagnosis not present

## 2020-04-10 DIAGNOSIS — Z20822 Contact with and (suspected) exposure to covid-19: Secondary | ICD-10-CM | POA: Diagnosis not present

## 2020-04-10 LAB — SARS CORONAVIRUS 2 BY RT PCR (HOSPITAL ORDER, PERFORMED IN ~~LOC~~ HOSPITAL LAB): SARS Coronavirus 2: NEGATIVE

## 2020-04-10 LAB — GROUP A STREP BY PCR: Group A Strep by PCR: NOT DETECTED

## 2020-04-10 LAB — MONONUCLEOSIS SCREEN: Mono Screen: NEGATIVE

## 2020-04-10 MED ORDER — IBUPROFEN 400 MG PO TABS
400.0000 mg | ORAL_TABLET | Freq: Once | ORAL | Status: AC | PRN
Start: 2020-04-10 — End: 2020-04-10
  Administered 2020-04-10: 400 mg via ORAL
  Filled 2020-04-10: qty 1

## 2020-04-10 NOTE — ED Provider Notes (Signed)
[facility] EMERGENCY DEPARTMENT Provider Note   CSN: [id number] Arrival date & time: 04/10/20  0844     History Chief Complaint  Patient presents with  . Sore Throat  . Generalized Body Aches    Stacy Kelley is a 29 y.o. female with no pertinent past medical history that presents the emergency department for sore throat and generalized body aches.  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  Denies any fevers, congestion, eye pain, ear pain, cough.  Denies any sick contacts.  Has not had Covid or got her Covid vaccines.  Denies any dysphagia.  Denies any difficulty breathing, chest pain.  Admits to myalgias all over her body.  Denies any headache, vision changes, weakness, paresthesias, gait abnormality.  Denies any abdominal pain, nausea, vomiting.  States has been urinating normally.  States that she has been drinking water and eating normally.  Denies any chance of orally transmitted sexual disease.  HPI     Past Medical History:  Diagnosis Date  . Genital herpes   . Gonorrhea     Patient Active Problem List   Diagnosis Date Noted  . Generalized rash 11/23/2015  . NSVD (normal spontaneous vaginal delivery) 05/11/2015    Past Surgical History:  Procedure Laterality Date  . CESAREAN SECTION       OB History    Gravida  5   Para  2   Term  2   Preterm      AB  3   Living  2     SAB  2   TAB  1   Ectopic      Multiple  0   Live Births  2           Family History  Problem Relation Age of Onset  . Diabetes Maternal Grandmother   . Diabetes Maternal Grandfather   . Diabetes Paternal Grandmother   . Hypertension Paternal Grandfather   . Diabetes Paternal Grandfather     Social History   Tobacco Use  . Smoking status: Never Smoker  . Smokeless tobacco: Never Used  Substance Use Topics  . Alcohol  use: No    Alcohol/week: 0.0 standard drinks  . Drug use: No    Home Medications Prior to Admission medications   Medication Sig Start Date End Date Taking? Authorizing Provider  medroxyPROGESTERone (DEPO-PROVERA) 150 MG/ML injection Inject 150 mg into the muscle every 3 (three) months. 03/12/20  Yes [provider]  diphenhydrAMINE (BENADRYL) 25 MG tablet Take 1 tablet (25 mg total) by mouth every 6 (six) hours. Patient not taking: Reported on 05/30/2018 04/08/16 11/11/19  Bethel Born, PA-C  etonogestrel-ethinyl estradiol (NUVARING) 0.12-0.015 MG/24HR vaginal ring Insert vaginally and leave in place for 3 consecutive weeks, then remove for 1 week. Patient not taking: Reported on 04/08/2016 11/22/15 11/11/19  Orvilla Cornwall A, CNM    Allergies    Augmentin [amoxicillin-pot clavulanate]  Review of Systems   Review of Systems  Constitutional: Negative for diaphoresis, fatigue and fever.  HENT: Positive for sore throat. Negative for congestion, dental problem, drooling, ear discharge, mouth sores, nosebleeds, postnasal drip, rhinorrhea, sinus pressure, sinus pain, sneezing, tinnitus, trouble swallowing and voice change.   Eyes: Negative for visual disturbance.  Respiratory: Negative for shortness of breath.   Cardiovascular: Negative for chest pain.  Gastrointestinal: Negative  for nausea and vomiting.  Musculoskeletal: Positive for arthralgias and myalgias. Negative for back pain, neck pain and neck stiffness.  Skin: Negative for color change, pallor, rash and wound.  Neurological: Negative for syncope, weakness, light-headedness, numbness and headaches.  Psychiatric/Behavioral: Negative for behavioral problems and confusion.    Physical Exam Updated Vital Signs BP 106/70 (BP Location: Right Arm)   Pulse 73   Temp 99.2 F (37.3 C) (Oral)   Resp 20   SpO2 100%   Physical Exam Constitutional:      General: She is not in acute distress.    Appearance: Normal  appearance. She is not ill-appearing, toxic-appearing or diaphoretic.     Comments: Patient without acute respiratory stress.  Patient is sitting comfortably in bed, no tripoding, use of accessory muscles.  Patient is speaking to me in full sentences.  Handling secretions well.  HENT:     Head: Normocephalic and atraumatic.     Jaw: There is normal jaw occlusion. No trismus, swelling or malocclusion.     Nose: No congestion or rhinorrhea.     Right Sinus: No maxillary sinus tenderness or frontal sinus tenderness.     Left Sinus: No maxillary sinus tenderness or frontal sinus tenderness.     Mouth/Throat:     Mouth: Mucous membranes are moist. No oral lesions.     Dentition: Normal dentition.     Tongue: No lesions.     Palate: No mass and lesions.     Pharynx: Oropharynx is clear. Uvula midline. No pharyngeal swelling, oropharyngeal exudate, posterior oropharyngeal erythema or uvula swelling.     Tonsils: No tonsillar exudate or tonsillar abscesses. 1+ on the right. 1+ on the left.     Comments: Patient without tonsillar enlargement or exudate.  No signs of peritonsillar abscess, palate without any tenderness or masses palpated.  No swelling under the tongue, uvula is midline without any inflammation. Eyes:     General: No visual field deficit.       Right eye: No discharge.        Left eye: No discharge.     Extraocular Movements: Extraocular movements intact.     Conjunctiva/sclera: Conjunctivae normal.     Pupils: Pupils are equal, round, and reactive to light.  Cardiovascular:     Rate and Rhythm: Normal rate and regular rhythm.     Pulses: Normal pulses.     Heart sounds: Normal heart sounds. No murmur heard.  No friction rub. No gallop.   Pulmonary:     Effort: Pulmonary effort is normal. No respiratory distress.     Breath sounds: Normal breath sounds. No stridor. No wheezing, rhonchi or rales.  Chest:     Chest wall: No tenderness.  Abdominal:     General: Abdomen is flat.  Bowel sounds are normal. There is no distension.     Palpations: Abdomen is soft.     Tenderness: There is no abdominal tenderness. There is no right CVA tenderness or left CVA tenderness.  Musculoskeletal:        General: No swelling or tenderness. Normal range of motion.     Cervical back: Normal range of motion. No rigidity or tenderness.     Right lower leg: No edema.     Left lower leg: No edema.  Lymphadenopathy:     Cervical: No cervical adenopathy.  Skin:    General: Skin is warm and dry.     Capillary Refill: Capillary refill takes less than 2 seconds.  Findings: No erythema or rash.  Neurological:     General: No focal deficit present.     Mental Status: She is alert and oriented to person, place, and time.     Cranial Nerves: Cranial nerves are intact. No cranial nerve deficit or facial asymmetry.     Motor: Motor function is intact. No weakness.     Coordination: Coordination is intact.     Gait: Gait is intact. Gait normal.  Psychiatric:        Mood and Affect: Mood normal.     ED Results / Procedures / Treatments   Labs (all labs ordered are listed, but only abnormal results are displayed) Labs Reviewed  GROUP A STREP BY PCR  SARS CORONAVIRUS 2 BY RT PCR (HOSPITAL ORDER, PERFORMED IN [place])  MONONUCLEOSIS SCREEN    EKG None  Radiology No results found.  Procedures Procedures (including critical care time)  Medications Ordered in ED Medications  ibuprofen (ADVIL) tablet 400 mg (400 mg Oral Given 04/10/20 6759)    ED Course  I have reviewed the triage vital signs and the nursing notes.  Pertinent labs & imaging results that were available during my care of the patient were reviewed by me and considered in my medical decision making (see chart for details).    MDM Rules/Calculators/A&P                         Stacy Kelley is a 29 y.o. female with no pertinent past medical history that presents the emergency department for sore  throat and generalized body aches.  Patient with normal exam, no trismus, respiratory stress, signs of peritonsillar abscess or deep space infection.  Tonsils are nonswollen, uvula is midline.  Patient has not gotten Covid vaccines.  Strep test and mono test negative.  Covid test pending.  Patient to be discharged for acute pharyngitis, comp respiratory infection.  Patient appears to be sitting comfortably in exam chair, asking for work note for 2 days.  Discussed patient in depth on return precautions and to follow CDC guidelines until Covid testing result.  Patient to follow-up with primary care in the next couple of days.  Patient given instructions on over-the-counter sore throat medications and to use Tylenol as prescribed on the bottle for pain.  Doubt need for further emergent work up at this time. I explained the diagnosis and have given explicit precautions to return to the ER including for any other new or worsening symptoms. The patient understands and accepts the medical plan as it's been dictated and I have answered their questions. Discharge instructions concerning home care and prescriptions have been given. The patient is STABLE and is discharged to home in good condition.   Final Clinical Impression(s) / ED Diagnoses Final diagnoses:  Acute pharyngitis, unspecified etiology    Rx / DC Orders ED Discharge Orders    None       Farrel Gordon, PA-C 04/10/20 1549    Wynetta Fines, MD 04/13/20 [id number]

## 2020-04-10 NOTE — ED Triage Notes (Signed)
Pt reports sore throat and generalized body aches since the weekend. States she works in a [employer] so she is inand out of a cooler a lot. Denies fever

## 2020-04-10 NOTE — Discharge Instructions (Addendum)
You are seen today for sore throat and generalized body aches, this is most likely due to common cold.  Your strep test and mono test were both negative.  I want you to follow-up with your primary care in the next couple of days.  I want you to gargle salt as we discussed and you can use the sore throat lozenges over-the-counter as we spoke about.  Only to take Tylenol over the next 3 days every 6 hours as prescribed on the bottle for pain.  I also want you to follow-up with your primary care in the next couple of days.  Use the attached instructions.  If you have any new or worsening concerning symptoms please come back to the emergency department, these can include trouble swallowing, difficulty breathing, chest pain, shortness of breath. Your COVID test will result on Mychart tonight, follow CDC guidelines until then.

## 2020-04-10 NOTE — ED Notes (Signed)
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

## 2021-09-11 ENCOUNTER — Emergency Department (HOSPITAL_COMMUNITY)
Admission: EM | Admit: 2021-09-11 | Discharge: 2021-09-11 | Disposition: A | Discharge status: Home / Self Care | Payer: Medicaid Other | Attending: Emergency Medicine | Admitting: Emergency Medicine

## 2021-09-11 ENCOUNTER — Encounter (HOSPITAL_COMMUNITY): Payer: Self-pay

## 2021-09-11 DIAGNOSIS — H00011 Hordeolum externum right upper eyelid: Secondary | ICD-10-CM | POA: Insufficient documentation

## 2021-09-11 DIAGNOSIS — H5711 Ocular pain, right eye: Secondary | ICD-10-CM | POA: Diagnosis present

## 2021-09-11 NOTE — ED Triage Notes (Signed)
Pt presents with c/o right eye pain. Pt reports the pain and redness has been present for approx 3 days.

## 2021-09-11 NOTE — Discharge Instructions (Signed)
As we discussed I recommend warm compresses multiple times daily, lubricating eyedrops as needed for symptomatic relief, tylenol, ibuprofen for pain.  Follow up with the ophthalmologist if your symptoms persist despite treatment for longer than 1 week.

## 2021-09-11 NOTE — ED Provider Notes (Signed)
[facility]-EMERGENCY DEPT Provider Note   CSN: [id number] Arrival date & time: 09/11/21  1331     History Chief Complaint  Patient presents with   Eye Pain    Stacy Kelley is a 30 y.o. female with no significant past medical history presents with right eye pain, redness, swelling for the last 3 days.  Patient reports redness started suddenly 3 days ago, some swelling and pain is worsened.  Patient reports minimal improvement with over-the-counter eyedrops.  Patient reports pain is 3/10.  Patient reports eye redness, watering.  Patient denies blurry vision, vision changes.  Patient denies purulent discharge.  Patient denies injury to the affected eye.   Eye Pain      Past Medical History:  Diagnosis Date   Genital herpes    Gonorrhea     Patient Active Problem List   Diagnosis Date Noted   Generalized rash 11/23/2015   NSVD (normal spontaneous vaginal delivery) 05/11/2015    Past Surgical History:  Procedure Laterality Date   CESAREAN SECTION       OB History     Gravida  5   Para  2   Term  2   Preterm      AB  3   Living  2      SAB  2   IAB  1   Ectopic      Multiple  0   Live Births  2           Family History  Problem Relation Age of Onset   Diabetes Maternal Grandmother    Diabetes Maternal Grandfather    Diabetes Paternal Grandmother    Hypertension Paternal Grandfather    Diabetes Paternal Grandfather     Social History   Tobacco Use   Smoking status: Never   Smokeless tobacco: Never  Substance Use Topics   Alcohol use: No    Alcohol/week: 0.0 standard drinks   Drug use: No    Home Medications Prior to Admission medications   Medication Sig Start Date End Date Taking? Authorizing Provider  medroxyPROGESTERone (DEPO-PROVERA) 150 MG/ML injection Inject 150 mg into the muscle every 3 (three) months. 03/12/20   [provider]  diphenhydrAMINE (BENADRYL) 25 MG tablet Take 1 tablet (25 mg  total) by mouth every 6 (six) hours. Patient not taking: Reported on 05/30/2018 04/08/16 11/11/19  Bethel Born, PA-C  etonogestrel-ethinyl estradiol (NUVARING) 0.12-0.015 MG/24HR vaginal ring Insert vaginally and leave in place for 3 consecutive weeks, then remove for 1 week. Patient not taking: Reported on 04/08/2016 11/22/15 11/11/19  Orvilla Cornwall A, CNM    Allergies    Augmentin [amoxicillin-pot clavulanate]  Review of Systems   Review of Systems  Eyes:  Positive for pain.  All other systems reviewed and are negative.  Physical Exam Updated Vital Signs BP 131/88 (BP Location: Left Arm)    Pulse 97    Temp 98 F (36.7 C) (Oral)    Resp 18    SpO2 100%   Physical Exam Vitals and nursing note reviewed.  Constitutional:      General: She is not in acute distress.    Appearance: Normal appearance.  HENT:     Head: Normocephalic and atraumatic.  Eyes:     General:        Right eye: No discharge.        Left eye: No discharge.     Comments: Healthy-appearing left eye with no discharge, redness, swelling.  Right eye with erythema of upper lid, small internal hordeolum noted on the upper lid towards the inner corner of the eye.  There is no evidence of purulent discharge.  Pupils are equal round reactive to light bilaterally.  Cardiovascular:     Rate and Rhythm: Normal rate and regular rhythm.  Pulmonary:     Effort: Pulmonary effort is normal. No respiratory distress.  Musculoskeletal:        General: No deformity.  Skin:    General: Skin is warm and dry.  Neurological:     Mental Status: She is alert and oriented to person, place, and time.  Psychiatric:        Mood and Affect: Mood normal.        Behavior: Behavior normal.    ED Results / Procedures / Treatments   Labs (all labs ordered are listed, but only abnormal results are displayed) Labs Reviewed - No data to display  EKG None  Radiology No results found.  Procedures Procedures   Medications Ordered  in ED Medications - No data to display  ED Course  I have reviewed the triage vital signs and the nursing notes.  Pertinent labs & imaging results that were available during my care of the patient were reviewed by me and considered in my medical decision making (see chart for details).    MDM Rules/Calculators/A&P                         Given duration of symptoms, acute onset, painful presentation, and clinical appearance believe that patient has early presentation of a hordeolum of the upper lid of the left eye.  Discussed treatment for this includes warm compresses, lubricating eyedrops as needed for symptomatic control, ibuprofen, Tylenol for pain, and expression of purulent drainage if the stye comes to ahead.  Patient given information for ophthalmology to follow-up if symptoms worsen or fail to improve.  Patient not complaining of blurry vision, or visual deficits.  Minimal clinical concern for bacterial infection of the eye.  Patient discharged in stable condition, return precautions given. Final Clinical Impression(s) / ED Diagnoses Final diagnoses:  Hordeolum externum of right upper eyelid    Rx / DC Orders ED Discharge Orders     None        [place] 09/11/21 Britt Bolognese, MD 09/13/21 2345

## 2021-09-14 ENCOUNTER — Other Ambulatory Visit: Payer: Self-pay

## 2021-09-14 ENCOUNTER — Emergency Department (HOSPITAL_COMMUNITY)
Admission: EM | Admit: 2021-09-14 | Discharge: 2021-09-14 | Disposition: A | Discharge status: Home / Self Care | Payer: Medicaid Other | Attending: Emergency Medicine | Admitting: Emergency Medicine

## 2021-09-14 ENCOUNTER — Encounter (HOSPITAL_COMMUNITY): Payer: Self-pay

## 2021-09-14 DIAGNOSIS — H00011 Hordeolum externum right upper eyelid: Secondary | ICD-10-CM | POA: Insufficient documentation

## 2021-09-14 DIAGNOSIS — H5789 Other specified disorders of eye and adnexa: Secondary | ICD-10-CM | POA: Diagnosis present

## 2021-09-14 MED ORDER — TETRACAINE HCL 0.5 % OP SOLN
1.0000 [drp] | Freq: Once | OPHTHALMIC | Status: AC
  Administered 2021-09-14: 1 [drp] via OPHTHALMIC
  Filled 2021-09-14: qty 4

## 2021-09-14 MED ORDER — CLINDAMYCIN HCL 300 MG PO CAPS: 300.0000 mg | ORAL_CAPSULE | Freq: Three times a day (TID) | ORAL | 0 refills | Status: AC

## 2021-09-14 MED ORDER — ACETAMINOPHEN 325 MG PO TABS
650.0000 mg | ORAL_TABLET | Freq: Once | ORAL | Status: AC
  Administered 2021-09-14: 650 mg via ORAL
  Filled 2021-09-14: qty 2

## 2021-09-14 MED ORDER — OFLOXACIN 0.3 % OP SOLN: 1.0000 [drp] | Freq: Four times a day (QID) | OPHTHALMIC | 0 refills | Status: AC

## 2021-09-14 NOTE — ED Triage Notes (Signed)
Pt states that she has been having R eye pain and drainage for the past 3 days, went to [facility] and was not prescribed anything

## 2021-09-14 NOTE — Discharge Instructions (Signed)
Call the ophthalmologist as discussed in the next 2-3 days.   Return immediately back to the ER if:  Your symptoms worsen within the next 12-24 hours. You develop new symptoms such as new fevers, persistent vomiting, new pain, shortness of breath, or new weakness or numbness, or if you have any other concerns.

## 2021-09-14 NOTE — ED Notes (Signed)
Discharge instructions reviewed with patient. Patient verbalized understanding of instructions. Follow-up care and medications were reviewed. Patient ambulatory with steady gait. VSS upon discharge.  ?

## 2021-09-14 NOTE — ED Provider Notes (Signed)
[facility] EMERGENCY DEPARTMENT Provider Note   CSN: [id number] Arrival date & time: 09/14/21  0017     History Chief Complaint  Patient presents with   Eye Drainage    Stacy Kelley is a 30 y.o. female.  Patient presents ER chief complaint of persistent right upper eyelid swelling and tenderness.  She has noticed it for about a week now.  She was seen at outside facility approximately 3 days ago and was discharged home with a diagnosis of stye.  She has persistent symptoms and presents to the ER.  Denies any change in vision.  Denies fevers or chills.  Denies vomiting or diarrhea.  She states swelling has not improved and presents to the ER.      Past Medical History:  Diagnosis Date   Genital herpes    Gonorrhea     Patient Active Problem List   Diagnosis Date Noted   Generalized rash 11/23/2015   NSVD (normal spontaneous vaginal delivery) 05/11/2015    Past Surgical History:  Procedure Laterality Date   CESAREAN SECTION       OB History     Gravida  5   Para  2   Term  2   Preterm      AB  3   Living  2      SAB  2   IAB  1   Ectopic      Multiple  0   Live Births  2           Family History  Problem Relation Age of Onset   Diabetes Maternal Grandmother    Diabetes Maternal Grandfather    Diabetes Paternal Grandmother    Hypertension Paternal Grandfather    Diabetes Paternal Grandfather     Social History   Tobacco Use   Smoking status: Never   Smokeless tobacco: Never  Substance Use Topics   Alcohol use: No    Alcohol/week: 0.0 standard drinks   Drug use: No    Home Medications Prior to Admission medications   Medication Sig Start Date End Date Taking? Authorizing Provider  clindamycin (CLEOCIN) 300 MG capsule Take 1 capsule (300 mg total) by mouth 3 (three) times daily for 7 days. 09/14/21 09/21/21 Yes Cheryll Cockayne, MD  ofloxacin (OCUFLOX) 0.3 % ophthalmic solution Place 1 drop into the right eye  4 (four) times daily for 7 days. 09/14/21 09/21/21 Yes Cheryll Cockayne, MD  medroxyPROGESTERone (DEPO-PROVERA) 150 MG/ML injection Inject 150 mg into the muscle every 3 (three) months. 03/12/20   [provider]  diphenhydrAMINE (BENADRYL) 25 MG tablet Take 1 tablet (25 mg total) by mouth every 6 (six) hours. Patient not taking: Reported on 05/30/2018 04/08/16 11/11/19  Bethel Born, PA-C  etonogestrel-ethinyl estradiol (NUVARING) 0.12-0.015 MG/24HR vaginal ring Insert vaginally and leave in place for 3 consecutive weeks, then remove for 1 week. Patient not taking: Reported on 04/08/2016 11/22/15 11/11/19  Orvilla Cornwall A, CNM    Allergies    Augmentin [amoxicillin-pot clavulanate]  Review of Systems   Review of Systems  Constitutional:  Negative for fever.  HENT:  Negative for ear pain.   Eyes:  Negative for visual disturbance.  Respiratory:  Negative for cough.   Cardiovascular:  Negative for chest pain.  Gastrointestinal:  Negative for abdominal pain.  Genitourinary:  Negative for flank pain.  Musculoskeletal:  Negative for back pain.  Skin:  Negative for rash.  Neurological:  Negative for headaches.  Physical Exam Updated Vital Signs BP 129/85 (BP Location: Left Arm)    Pulse 97    Temp 98.7 F (37.1 C) (Oral)    Resp 16    Ht 5\' 2"  (1.575 m)    Wt 52.2 kg    SpO2 100%    BMI 21.03 kg/m   Physical Exam Constitutional:      General: She is not in acute distress.    Appearance: Normal appearance.  HENT:     Head: Normocephalic.     Nose: Nose normal.  Eyes:     Extraocular Movements: Extraocular movements intact.     Conjunctiva/sclera: Conjunctivae normal.     Pupils: Pupils are equal, round, and reactive to light.     Comments: Right upper eyelid has a central lesion appears swollen approximately 0.5 cm in size.  No lesion noted at the margin of the eyelid.  Clinically concerning for stye versus hordeolum.  Cardiovascular:     Rate and Rhythm: Normal rate.   Pulmonary:     Effort: Pulmonary effort is normal.  Musculoskeletal:        General: Normal range of motion.     Cervical back: Normal range of motion.  Neurological:     General: No focal deficit present.     Mental Status: She is alert. Mental status is at baseline.    ED Results / Procedures / Treatments   Labs (all labs ordered are listed, but only abnormal results are displayed) Labs Reviewed - No data to display  EKG None  Radiology No results found.  Procedures Procedures   Medications Ordered in ED Medications  tetracaine (PONTOCAINE) 0.5 % ophthalmic solution 1 drop (has no administration in time range)  acetaminophen (TYLENOL) tablet 650 mg (has no administration in time range)    ED Course  I have reviewed the triage vital signs and the nursing notes.  Pertinent labs & imaging results that were available during my care of the patient were reviewed by me and considered in my medical decision making (see chart for details).    MDM Rules/Calculators/A&P                         She denies any change in vision.  Complaint of irritation to the eye and painful swelling to the eyelid.  Differential included orbital cellulitis.  However clinically suspect a stye/hordeolum given localized swelling of the eyelid only.  No pain with extraocular motion noted.   Will be given prescription of antibiotics, advised outpatient follow-up with ophthalmology within the week.  Advising immediate return for worsening symptoms increased redness increased pain, change in vision, fevers or any additional concerns.    Final Clinical Impression(s) / ED Diagnoses Final diagnoses:  Hordeolum of right upper eyelid, unspecified hordeolum type    Rx / DC Orders ED Discharge Orders          Ordered    clindamycin (CLEOCIN) 300 MG capsule  3 times daily        09/14/21 0124    ofloxacin (OCUFLOX) 0.3 % ophthalmic solution  4 times daily        09/14/21 0124              Luna Fuse, MD 09/14/21 [phone]

## 2022-02-12 ENCOUNTER — Other Ambulatory Visit: Payer: Self-pay

## 2022-02-12 ENCOUNTER — Emergency Department (HOSPITAL_COMMUNITY): Payer: Medicaid Other

## 2022-02-12 ENCOUNTER — Emergency Department (HOSPITAL_COMMUNITY)
Admission: EM | Admit: 2022-02-12 | Discharge: 2022-02-12 | Disposition: A | Discharge status: Home / Self Care | Payer: Medicaid Other | Attending: Nurse Practitioner | Admitting: Nurse Practitioner

## 2022-02-12 ENCOUNTER — Encounter (HOSPITAL_COMMUNITY): Payer: Self-pay | Admitting: Emergency Medicine

## 2022-02-12 DIAGNOSIS — R221 Localized swelling, mass and lump, neck: Secondary | ICD-10-CM | POA: Diagnosis present

## 2022-02-12 DIAGNOSIS — R490 Dysphonia: Secondary | ICD-10-CM | POA: Insufficient documentation

## 2022-02-12 DIAGNOSIS — Z5321 Procedure and treatment not carried out due to patient leaving prior to being seen by health care provider: Secondary | ICD-10-CM | POA: Diagnosis not present

## 2022-02-12 LAB — CBC WITH DIFFERENTIAL/PLATELET
Abs Immature Granulocytes: 0 10*3/uL (ref 0.00–0.07)
Basophils Absolute: 0 10*3/uL (ref 0.0–0.1)
Basophils Relative: 0 %
Eosinophils Absolute: 0 10*3/uL (ref 0.0–0.5)
Eosinophils Relative: 0 %
HCT: 37.6 % (ref 36.0–46.0)
Hemoglobin: 12.7 g/dL (ref 12.0–15.0)
Lymphocytes Relative: 18 %
Lymphs Abs: 0.8 10*3/uL (ref 0.7–4.0)
MCH: 29.1 pg (ref 26.0–34.0)
MCHC: 33.8 g/dL (ref 30.0–36.0)
MCV: 86.2 fL (ref 80.0–100.0)
Monocytes Absolute: 0.3 10*3/uL (ref 0.1–1.0)
Monocytes Relative: 6 %
Neutro Abs: 3.5 10*3/uL (ref 1.7–7.7)
Neutrophils Relative %: 76 %
Platelets: 194 10*3/uL (ref 150–400)
RBC: 4.36 MIL/uL (ref 3.87–5.11)
RDW: 14.1 % (ref 11.5–15.5)
WBC: 4.6 10*3/uL (ref 4.0–10.5)
nRBC: 0 % (ref 0.0–0.2)

## 2022-02-12 LAB — BASIC METABOLIC PANEL
Anion gap: 7 (ref 5–15)
BUN: 12 mg/dL (ref 6–20)
CO2: 24 mmol/L (ref 22–32)
Calcium: 9.3 mg/dL (ref 8.9–10.3)
Chloride: 108 mmol/L (ref 98–111)
Creatinine, Ser: 0.6 mg/dL (ref 0.44–1.00)
GFR, Estimated: >60 mL/min (ref >60)
Glucose, Bld: 93 mg/dL (ref 70–99)
Potassium: 2.8 mmol/L — ABNORMAL LOW (ref 3.5–5.1)
Sodium: 139 mmol/L (ref 135–145)

## 2022-02-12 NOTE — ED Provider Triage Note (Signed)
Emergency Medicine Provider Triage Evaluation Note  Stacy Kelley , a 31 y.o. female  was evaluated in triage.  Pt complains of swelling to the underside of her neck. Patient noticed it this morning upon awakening, and was unable to talk She took advil with some reduction in swelling and dysphonia  Review of Systems  Positive: Swelling of soft tissue of neck. dysphonia Negative: Shortness of breath, difficulty swallowing  Physical Exam  BP 119/78 (BP Location: Right Arm)   Pulse 89   Temp 99.8 F (37.7 C) (Oral)   Resp 16   SpO2 100%  Gen:   Awake, no distress   Resp:  Normal effort  MSK:   Moves extremities without difficulty  Other:  No dental caries  Medical Decision Making  Medically screening exam initiated at 2:22 PM.  Appropriate orders placed.  MERIDETH BOSQUE was informed that the remainder of the evaluation will be completed by another provider, this initial triage assessment does not replace that evaluation, and the importance of remaining in the ED until their evaluation is complete.     Felicie Morn, NP 02/12/22 1704

## 2022-02-12 NOTE — ED Triage Notes (Signed)
Patient present with swelling along the left side of the jaw line into the neck. She reports difficulty swallowing. Symptoms started last night. She denies trouble with breathing.  ?

## 2022-02-12 NOTE — ED Notes (Signed)
Pt called once for vital reassessment and called a second time for CT. No answer. ?

## 2022-02-13 ENCOUNTER — Emergency Department (HOSPITAL_COMMUNITY): Payer: Medicaid Other

## 2022-02-13 ENCOUNTER — Encounter (HOSPITAL_COMMUNITY): Payer: Self-pay | Admitting: Emergency Medicine

## 2022-02-13 ENCOUNTER — Emergency Department (HOSPITAL_COMMUNITY)
Admission: EM | Admit: 2022-02-13 | Discharge: 2022-02-13 | Disposition: A | Discharge status: Home / Self Care | Payer: Medicaid Other | Attending: Emergency Medicine | Admitting: Emergency Medicine

## 2022-02-13 DIAGNOSIS — R591 Generalized enlarged lymph nodes: Secondary | ICD-10-CM | POA: Diagnosis present

## 2022-02-13 DIAGNOSIS — E876 Hypokalemia: Secondary | ICD-10-CM | POA: Insufficient documentation

## 2022-02-13 DIAGNOSIS — R609 Edema, unspecified: Secondary | ICD-10-CM

## 2022-02-13 LAB — BASIC METABOLIC PANEL
Anion gap: 7 (ref 5–15)
BUN: 9 mg/dL (ref 6–20)
CO2: 22 mmol/L (ref 22–32)
Calcium: 9.2 mg/dL (ref 8.9–10.3)
Chloride: 108 mmol/L (ref 98–111)
Creatinine, Ser: 0.55 mg/dL (ref 0.44–1.00)
GFR, Estimated: >60 mL/min (ref >60)
Glucose, Bld: 91 mg/dL (ref 70–99)
Potassium: 2.9 mmol/L — ABNORMAL LOW (ref 3.5–5.1)
Sodium: 137 mmol/L (ref 135–145)

## 2022-02-13 LAB — I-STAT BETA HCG BLOOD, ED (MC, WL, AP ONLY): I-stat hCG, quantitative: <5 m[IU]/mL (ref <5)

## 2022-02-13 LAB — CBC WITH DIFFERENTIAL/PLATELET
Abs Immature Granulocytes: 0 10*3/uL (ref 0.00–0.07)
Basophils Absolute: 0 10*3/uL (ref 0.0–0.1)
Basophils Relative: 0 %
Eosinophils Absolute: 0 10*3/uL (ref 0.0–0.5)
Eosinophils Relative: 1 %
HCT: 38.3 % (ref 36.0–46.0)
Hemoglobin: 12.7 g/dL (ref 12.0–15.0)
Lymphocytes Relative: 18 %
Lymphs Abs: 0.7 10*3/uL (ref 0.7–4.0)
MCH: 28.3 pg (ref 26.0–34.0)
MCHC: 33.2 g/dL (ref 30.0–36.0)
MCV: 85.3 fL (ref 80.0–100.0)
Monocytes Absolute: 0.2 10*3/uL (ref 0.1–1.0)
Monocytes Relative: 4 %
Neutro Abs: 3.1 10*3/uL (ref 1.7–7.7)
Neutrophils Relative %: 77 %
Platelets: 205 10*3/uL (ref 150–400)
RBC: 4.49 MIL/uL (ref 3.87–5.11)
RDW: 14 % (ref 11.5–15.5)
WBC: 4 10*3/uL (ref 4.0–10.5)
nRBC: 0 % (ref 0.0–0.2)
nRBC: 0 /100 WBC

## 2022-02-13 MED ORDER — IOHEXOL 300 MG/ML  SOLN
100.0000 mL | Freq: Once | INTRAMUSCULAR | Status: AC | PRN
  Administered 2022-02-13: 75 mL via INTRAVENOUS

## 2022-02-13 MED ORDER — CEPHALEXIN 500 MG PO CAPS: 500.0000 mg | ORAL_CAPSULE | Freq: Four times a day (QID) | ORAL | 0 refills | Status: AC

## 2022-02-13 NOTE — ED Provider Notes (Signed)
[facility] EMERGENCY DEPARTMENT Provider Note   CSN: [id number] Arrival date & time: 02/13/22  6389     History  Chief Complaint  Patient presents with   Lymphadenopathy    Stacy Kelley is a 31 y.o. female. With no significant past medical history who presents to the emergency department for swollen lymph nodes.  States yesterday she began having pain and slight swelling under her left jaw.  She states that the pain got rather intense and she ended up at [place] long but left after triage.  She states that the pain is better today but she is having ongoing swelling.  She denies any sore throat, cough or rhinorrhea.  She denies any dental pain or caries.  She does have sensitive gums.  She denies any difficulty swallowing or shortness of breath.  Denies fevers.   HPI     Home Medications Prior to Admission medications   Medication Sig Start Date End Date Taking? Authorizing Provider  medroxyPROGESTERone (DEPO-PROVERA) 150 MG/ML injection Inject 150 mg into the muscle every 3 (three) months. 03/12/20   [provider]  diphenhydrAMINE (BENADRYL) 25 MG tablet Take 1 tablet (25 mg total) by mouth every 6 (six) hours. Patient not taking: Reported on 05/30/2018 04/08/16 11/11/19  Bethel Born, PA-C  etonogestrel-ethinyl estradiol (NUVARING) 0.12-0.015 MG/24HR vaginal ring Insert vaginally and leave in place for 3 consecutive weeks, then remove for 1 week. Patient not taking: Reported on 04/08/2016 11/22/15 11/11/19  Orvilla Cornwall A, CNM      Allergies    Augmentin [amoxicillin-pot clavulanate]    Review of Systems   Review of Systems  HENT:  Positive for facial swelling.   All other systems reviewed and are negative.  Physical Exam Updated Vital Signs BP (!) 128/96 (BP Location: Right Arm)   Pulse 91   Temp 98.2 F (36.8 C) (Oral)   Resp 16   SpO2 100%  Physical Exam Vitals and nursing note reviewed.  Constitutional:      General: She is  not in acute distress.    Appearance: Normal appearance. She is not ill-appearing or toxic-appearing.  HENT:     Head: Normocephalic and atraumatic.     Jaw: No trismus.      Comments: Swelling under the left mandible that is tender to palpation    Nose: Nose normal.     Mouth/Throat:     Mouth: Mucous membranes are moist.     Dentition: Normal dentition. No dental tenderness.     Tongue: No lesions.     Pharynx: Oropharynx is clear. Uvula midline. Posterior oropharyngeal erythema present. No uvula swelling.     Tonsils: No tonsillar exudate or tonsillar abscesses. 0 on the right. 0 on the left.     Comments: Oropharynx with mild erythema without tonsillar swelling or exudates.  No dental caries or dental pain.  There is no periapical abscesses. Eyes:     General: No scleral icterus.    Extraocular Movements: Extraocular movements intact.     Pupils: Pupils are equal, round, and reactive to light.  Pulmonary:     Effort: Pulmonary effort is normal. No respiratory distress.     Breath sounds: Normal breath sounds. No stridor. No wheezing.  Musculoskeletal:     Cervical back: Normal range of motion and neck supple. Tenderness present.  Skin:    General: Skin is warm and dry.     Capillary Refill: Capillary refill takes less than 2 seconds.  Findings: No rash.  Neurological:     General: No focal deficit present.     Mental Status: She is alert and oriented to person, place, and time. Mental status is at baseline.  Psychiatric:        Mood and Affect: Mood normal.        Behavior: Behavior normal.        Thought Content: Thought content normal.        Judgment: Judgment normal.    ED Results / Procedures / Treatments   Labs (all labs ordered are listed, but only abnormal results are displayed) Labs Reviewed  BASIC METABOLIC PANEL - Abnormal; Notable for the following components:      Result Value   Potassium 2.9 (*)    All other components within normal limits  CBC WITH  DIFFERENTIAL/PLATELET  I-STAT BETA HCG BLOOD, ED ([facility], [facility], [facility] ONLY)    EKG None  Radiology CT Soft Tissue Neck W Contrast  Result Date: 02/13/2022 CLINICAL DATA:  Soft tissue swelling, infection suspected EXAM: CT NECK WITH CONTRAST TECHNIQUE: Multidetector CT imaging of the neck was performed using the standard protocol following the bolus administration of intravenous contrast. RADIATION DOSE REDUCTION: This exam was performed according to the departmental dose-optimization program which includes automated exposure control, adjustment of the mA and/or kV according to patient size and/or use of iterative reconstruction technique. CONTRAST:  39mL OMNIPAQUE IOHEXOL 300 MG/ML  SOLN COMPARISON:  CT neck 01/25/2013 FINDINGS: Pharynx and larynx: Normal. No mass or swelling. Salivary glands: No inflammation, mass, or stone. Thyroid: Thyromegaly bilaterally. Right lobe of the thyroid measures 19 x 30 x 84 mm. Left lobe of the thyroid measures 28 x 21 x 61 mm. 11 mm hypodense nodule in the left isthmus. Additional smaller nodules are present on the left. Lymph nodes: Multiple lymph nodes are present in the neck bilaterally. No definite pathologically enlarged lymph nodes. No nodes with necrosis. Right level 2 lymph node 10 mm. 8 mm right level 3 lymph node. Multiple subcentimeter posterior lymph nodes on the right. Left level 2 lymph node 9 mm and 8 mm. Multiple subcentimeter posterior lymph nodes on the left. Vascular: Normal vascular enhancement Limited intracranial: Negative Visualized orbits: Negative Mastoids and visualized paranasal sinuses: Mild mucosal edema maxillary sinus bilaterally. Remaining sinuses clear. Skeleton: Negative Upper chest: Lung apices clear bilaterally Other: None IMPRESSION: Negative for pharyngeal mass or abscess. Multiple lymph nodes in the neck bilaterally are likely reactive. No pathologically enlarged lymph nodes. Thyromegaly bilaterally. 11 mm nodule left isthmus. Recommend  thyroid US (ref: J Am Coll Radiol. 2015 Feb;12(2): 143-50). Electronically Signed   By: Marlan Palau M.D.   On: 02/13/2022 11:37    Procedures Procedures   Medications Ordered in ED Medications  iohexol (OMNIPAQUE) 300 MG/ML solution 100 mL (75 mLs Intravenous Contrast Given 02/13/22 1118)    ED Course/ Medical Decision Making/ A&P                           Medical Decision Making Amount and/or Complexity of Data Reviewed Labs: ordered. Radiology: ordered.  Risk Prescription drug management.  This patient presents to the ED for concern of swollen lymph nodes, this involves an extensive number of treatment options, and is a complaint that carries with it a high risk of complications and morbidity.  The differential diagnosis includes viral illness, strep throat, malignancy, EBV, etc.   Co morbidities that complicate the patient evaluation None  Additional history obtained:  Additional history obtained from: none available  External records from outside source obtained and reviewed including: no relevant documents to review  EKG: Not indicated  Cardiac Monitoring: Not indicated   Lab Results: I personally ordered, reviewed, and interpreted labs. Pertinent results include: CBC within normal limits BMP with low potassium at 2.9, given instruction on foods Not pregnant  Imaging Studies ordered:  I ordered imaging studies which included CT.  I independently reviewed & interpreted imaging & am in agreement with radiology impression. Imaging shows: CT soft tissue of the neck shows negative for pharyngeal mass or abscess.  Multiple lymph nodes in the neck bilaterally are likely reactive.  No pathologically enlarged lymph nodes.  She has thyromegaly bilaterally  Medications  -I reviewed the patient's home medications and did not make adjustments. -I did  prescribe new home medications.  Tests Considered: No further testing at this time  Critical Interventions: None  required  Consultations: None required  SDH None identified  ED Course: 31 year old female who presents to the emergency department for swollen lymph nodes  She does have facial swelling on physical exam on the left side and under the mandible.  Tender to palpation.  Obtain labs which were unremarkable.  Obtain CT soft tissue of the neck which shows that she has reactive lymph nodes bilaterally but no mass or abscess.  Incidentally noted to have thyromegaly.  This does not correlate to the tenderness on her face.  Labs without leukocytosis.  She is afebrile.  She is nontoxic in appearance.  Her airway is clear without evidence of PTA or RPA.  There is no trismus.  No stridor.  No evidence of angioedema.  Do not think this is Ludwig's angina.  She does not have tonsillar swelling or exudate or sore throat so doubt strep throat.  Will defer any testing for this.  No evidence of periapical abscess on exam.  Doubt other infectious etiology such as EBV or HIV.  Likely cellulitis.  I will prescribe her Keflex which she will take over the next 7 days.  She is in agreement with this plan.  I have also asked if she can follow-up with her PCP in 1 week to have symptoms follow-up.  She is agreeable to this.  Given return precautions for any difficulty swallowing or shortness of breath or worsening swelling with fever.  Verbalized understanding.  After consideration of the diagnostic results and the patients response to treatment, I feel that the patent would benefit from discharge. The patient has been appropriately medically screened and/or stabilized in the ED. I have low suspicion for any other emergent medical condition which would require further screening, evaluation or treatment in the ED or require inpatient management. The patient is overall well appearing and non-toxic in appearance. They are hemodynamically stable at time of discharge.   Final Clinical Impression(s) / ED Diagnoses Final  diagnoses:  Swelling    Rx / DC Orders ED Discharge Orders          Ordered    cephALEXin (KEFLEX) 500 MG capsule  4 times daily        02/13/22 1213              Cristopher Peruutry, Brayam Boeke E, PA-C 02/13/22 1237    Melene PlanFloyd, Dan, DO 02/13/22 1454

## 2022-02-13 NOTE — Discharge Instructions (Addendum)
You were seen in the emergency department day for swelling under your chin.  This is likely local inflammation.  I am providing you with a prescription for antibiotics they will take 4 times a day over the next 5 days.  You can also take Motrin or Tylenol for pain.  Please return to the emergency department if you have any difficulty breathing or swallowing.  Additionally I would like you to follow-up with primary care provider in a week to check in on your symptoms and ensure that your swelling is improving.

## 2022-02-13 NOTE — ED Triage Notes (Signed)
Patient complains of swollen, tender submandibular lymph nodes since yesterday morning. Patient works at a [employer]. States pain was worse yesterday but improved today. Denies fever, denies shortness of breath, reports mild headache yesterday that has resolved.

## 2022-02-15 ENCOUNTER — Telehealth (HOSPITAL_COMMUNITY): Payer: Self-pay | Admitting: Physician Assistant

## 2022-02-15 ENCOUNTER — Ambulatory Visit (HOSPITAL_COMMUNITY)
Admission: EM | Admit: 2022-02-15 | Discharge: 2022-02-15 | Disposition: A | Discharge status: Home / Self Care | Payer: Medicaid Other | Attending: Physician Assistant | Admitting: Physician Assistant

## 2022-02-15 ENCOUNTER — Encounter (HOSPITAL_COMMUNITY): Payer: Self-pay | Admitting: Emergency Medicine

## 2022-02-15 DIAGNOSIS — K120 Recurrent oral aphthae: Secondary | ICD-10-CM | POA: Diagnosis not present

## 2022-02-15 MED ORDER — ALUM & MAG HYDROXIDE-SIMETH 200-200-20 MG/5ML PO SUSP: 10.0000 mL | Freq: Four times a day (QID) | ORAL | 0 refills | Status: DC | PRN

## 2022-02-15 MED ORDER — NYSTATIN 100000 UNIT/ML MT SUSP: 10.0000 mL | Freq: Four times a day (QID) | OROMUCOSAL | 0 refills | Status: DC | PRN

## 2022-02-15 NOTE — ED Triage Notes (Signed)
Pt is present today with gum pain and swelling.Pt states that she noticed the pain one week ago. Pt states the pain is located on the right side of her mouth

## 2022-02-15 NOTE — ED Provider Notes (Signed)
[facility]    CSN: [id number] Arrival date & time: 02/15/22  1001      History   Chief Complaint Chief Complaint  Patient presents with   mouth pain    HPI Stacy Kelley is a 31 y.o. female.   Pt presents with right sided oral lesion that started several days ago.  She reports she has had a similar ulcer in the past, but not this bad.  She is currently being treated with an antibiotic for left facial swelling.  She denies fever, chills, n/v.  She reports she has been eating less due to the pain.  She has tried OTC treatments including Anbesol, baking soda paste, otc mouthwash with minimal improvement.    Past Medical History:  Diagnosis Date   Genital herpes    Gonorrhea     Patient Active Problem List   Diagnosis Date Noted   Generalized rash 11/23/2015   NSVD (normal spontaneous vaginal delivery) 05/11/2015    Past Surgical History:  Procedure Laterality Date   CESAREAN SECTION      OB History     Gravida  5   Para  2   Term  2   Preterm      AB  3   Living  2      SAB  2   IAB  1   Ectopic      Multiple  0   Live Births  2            Home Medications    Prior to Admission medications   Medication Sig Start Date End Date Taking? Authorizing Provider  magic mouthwash (nystatin, lidocaine, diphenhydrAMINE, alum & mag hydroxide) suspension Swish and spit 10 mLs 4 (four) times daily as needed for mouth pain. 02/15/22  Yes Ward, Tylene FantasiaJessica Z, PA-C  cephALEXin (KEFLEX) 500 MG capsule Take 1 capsule (500 mg total) by mouth 4 (four) times daily for 5 days. 02/13/22 02/18/22  Cristopher PeruAutry, Lauren E, PA-C  docusate sodium (COLACE) 100 MG capsule Take 100 mg by mouth daily. 01/15/22   [provider]  hydrocortisone (ANUSOL-HC) 2.5 % rectal cream Place 1 application. rectally 2 (two) times daily. 01/29/22   [provider]  medroxyPROGESTERone (DEPO-PROVERA) 150 MG/ML injection Inject 150 mg into the muscle every 3 (three) months.  03/12/20   [provider]  triamcinolone cream (KENALOG) 0.1 % Apply 1 application. topically 2 (two) times daily. 01/09/22   [provider]  valACYclovir (VALTREX) 500 MG tablet Take 500 mg by mouth 2 (two) times daily. 01/09/22   [provider]  diphenhydrAMINE (BENADRYL) 25 MG tablet Take 1 tablet (25 mg total) by mouth every 6 (six) hours. Patient not taking: Reported on 05/30/2018 04/08/16 11/11/19  Bethel BornGekas, Kelly Marie, PA-C  etonogestrel-ethinyl estradiol (NUVARING) 0.12-0.015 MG/24HR vaginal ring Insert vaginally and leave in place for 3 consecutive weeks, then remove for 1 week. Patient not taking: Reported on 04/08/2016 11/22/15 11/11/19  Roe Coombsenney, Rachelle A, CNM    Family History Family History  Problem Relation Age of Onset   Diabetes Maternal Grandmother    Diabetes Maternal Grandfather    Diabetes Paternal Grandmother    Hypertension Paternal Grandfather    Diabetes Paternal Grandfather     Social History Social History   Tobacco Use   Smoking status: Never   Smokeless tobacco: Never  Substance Use Topics   Alcohol use: No    Alcohol/week: 0.0 standard drinks   Drug use: No  Allergies   Augmentin [amoxicillin-pot clavulanate]   Review of Systems Review of Systems  Constitutional:  Negative for chills and fever.  HENT:  Positive for mouth sores. Negative for ear pain and sore throat.   Eyes:  Negative for pain and visual disturbance.  Respiratory:  Negative for cough and shortness of breath.   Cardiovascular:  Negative for chest pain and palpitations.  Gastrointestinal:  Negative for abdominal pain and vomiting.  Genitourinary:  Negative for dysuria and hematuria.  Musculoskeletal:  Negative for arthralgias and back pain.  Skin:  Negative for color change and rash.  Neurological:  Negative for seizures and syncope.  All other systems reviewed and are negative.   Physical Exam Triage Vital Signs ED Triage Vitals [02/15/22 1020]   Enc Vitals Group     BP 113/62     Pulse Rate 95     Resp 17     Temp 98.3 F (36.8 C)     Temp Source Oral     SpO2 100 %     Weight      Height      Head Circumference      Peak Flow      Pain Score 8     Pain Loc      Pain Edu?      Excl. in GC?    No data found.  Updated Vital Signs BP 113/62   Pulse 95   Temp 98.3 F (36.8 C) (Oral)   Resp 17   SpO2 100%   Visual Acuity Right Eye Distance:   Left Eye Distance:   Bilateral Distance:    Right Eye Near:   Left Eye Near:    Bilateral Near:     Physical Exam Vitals and nursing note reviewed.  Constitutional:      General: She is not in acute distress.    Appearance: She is well-developed.  HENT:     Head: Normocephalic and atraumatic.     Mouth/Throat:     Comments: Aphthous ulcer noted to right buccal mucosa, no signs of infection Eyes:     Conjunctiva/sclera: Conjunctivae normal.  Cardiovascular:     Rate and Rhythm: Normal rate and regular rhythm.     Heart sounds: No murmur heard. Pulmonary:     Effort: Pulmonary effort is normal. No respiratory distress.     Breath sounds: Normal breath sounds.  Abdominal:     Palpations: Abdomen is soft.     Tenderness: There is no abdominal tenderness.  Musculoskeletal:        General: No swelling.     Cervical back: Neck supple.  Skin:    General: Skin is warm and dry.     Capillary Refill: Capillary refill takes less than 2 seconds.  Neurological:     Mental Status: She is alert.  Psychiatric:        Mood and Affect: Mood normal.     UC Treatments / Results  Labs (all labs ordered are listed, but only abnormal results are displayed) Labs Reviewed - No data to display  EKG   Radiology CT Soft Tissue Neck W Contrast  Result Date: 02/13/2022 CLINICAL DATA:  Soft tissue swelling, infection suspected EXAM: CT NECK WITH CONTRAST TECHNIQUE: Multidetector CT imaging of the neck was performed using the standard protocol following the bolus  administration of intravenous contrast. RADIATION DOSE REDUCTION: This exam was performed according to the departmental dose-optimization program which includes automated exposure control, adjustment of the mA and/or kV  according to patient size and/or use of iterative reconstruction technique. CONTRAST:  17mL OMNIPAQUE IOHEXOL 300 MG/ML  SOLN COMPARISON:  CT neck 01/25/2013 FINDINGS: Pharynx and larynx: Normal. No mass or swelling. Salivary glands: No inflammation, mass, or stone. Thyroid: Thyromegaly bilaterally. Right lobe of the thyroid measures 19 x 30 x 84 mm. Left lobe of the thyroid measures 28 x 21 x 61 mm. 11 mm hypodense nodule in the left isthmus. Additional smaller nodules are present on the left. Lymph nodes: Multiple lymph nodes are present in the neck bilaterally. No definite pathologically enlarged lymph nodes. No nodes with necrosis. Right level 2 lymph node 10 mm. 8 mm right level 3 lymph node. Multiple subcentimeter posterior lymph nodes on the right. Left level 2 lymph node 9 mm and 8 mm. Multiple subcentimeter posterior lymph nodes on the left. Vascular: Normal vascular enhancement Limited intracranial: Negative Visualized orbits: Negative Mastoids and visualized paranasal sinuses: Mild mucosal edema maxillary sinus bilaterally. Remaining sinuses clear. Skeleton: Negative Upper chest: Lung apices clear bilaterally Other: None IMPRESSION: Negative for pharyngeal mass or abscess. Multiple lymph nodes in the neck bilaterally are likely reactive. No pathologically enlarged lymph nodes. Thyromegaly bilaterally. 11 mm nodule left isthmus. Recommend thyroid US (ref: J Am Coll Radiol. 2015 Feb;12(2): 143-50). Electronically Signed   By: Marlan Palau M.D.   On: 02/13/2022 11:37    Procedures Procedures (including critical care time)  Medications Ordered in UC Medications - No data to display  Initial Impression / Assessment and Plan / UC Course  I have reviewed the triage vital signs and the  nursing notes.  Pertinent labs & imaging results that were available during my care of the patient were reviewed by me and considered in my medical decision making (see chart for details).     Aphthous ulcer, magic mouthwash prescribed.  Advised pt to follow up with PCP if needed.  Final Clinical Impressions(s) / UC Diagnoses   Final diagnoses:  Aphthous ulcer of mouth     Discharge Instructions      Use mouthwash 3-4 times per day until symptoms resolve Follow up with primary care    ED Prescriptions     Medication Sig Dispense Auth. Provider   magic mouthwash (nystatin, lidocaine, diphenhydrAMINE, alum & mag hydroxide) suspension Swish and spit 10 mLs 4 (four) times daily as needed for mouth pain. 180 mL Ward, Tylene Fantasia, PA-C      PDMP not reviewed this encounter.   Ward, Tylene Fantasia, PA-C 02/15/22 1100

## 2022-02-15 NOTE — Discharge Instructions (Signed)
Use mouthwash 3-4 times per day until symptoms resolve Follow up with primary care

## 2022-02-15 NOTE — Telephone Encounter (Signed)
Pt returned for a hard copy of her prescription

## 2022-04-08 ENCOUNTER — Other Ambulatory Visit: Payer: Self-pay | Admitting: Oral Surgery

## 2022-04-29 DIAGNOSIS — M329 Systemic lupus erythematosus, unspecified: Secondary | ICD-10-CM

## 2022-04-29 HISTORY — DX: Systemic lupus erythematosus, unspecified: M32.9

## 2022-05-12 DIAGNOSIS — M3500 Sicca syndrome, unspecified: Secondary | ICD-10-CM | POA: Diagnosis present

## 2022-05-12 HISTORY — DX: Sjogren syndrome, unspecified: M35.00

## 2022-06-11 DIAGNOSIS — M329 Systemic lupus erythematosus, unspecified: Secondary | ICD-10-CM | POA: Diagnosis present

## 2022-06-11 DIAGNOSIS — D509 Iron deficiency anemia, unspecified: Secondary | ICD-10-CM | POA: Diagnosis present

## 2022-06-11 DIAGNOSIS — E559 Vitamin D deficiency, unspecified: Secondary | ICD-10-CM

## 2022-06-11 HISTORY — DX: Vitamin D deficiency, unspecified: E55.9

## 2022-07-05 ENCOUNTER — Other Ambulatory Visit: Payer: Self-pay

## 2022-07-05 ENCOUNTER — Emergency Department (HOSPITAL_COMMUNITY): Payer: Medicaid Other

## 2022-07-05 ENCOUNTER — Encounter (HOSPITAL_COMMUNITY): Payer: Self-pay

## 2022-07-05 ENCOUNTER — Emergency Department (HOSPITAL_COMMUNITY)
Admission: EM | Admit: 2022-07-05 | Discharge: 2022-07-05 | Disposition: A | Discharge status: Home / Self Care | Payer: Medicaid Other | Attending: Emergency Medicine | Admitting: Emergency Medicine

## 2022-07-05 ENCOUNTER — Encounter (HOSPITAL_COMMUNITY): Payer: Self-pay | Admitting: Emergency Medicine

## 2022-07-05 ENCOUNTER — Ambulatory Visit (HOSPITAL_COMMUNITY)
Admission: EM | Admit: 2022-07-05 | Discharge: 2022-07-05 | Disposition: A | Discharge status: Home / Self Care | Payer: Medicaid Other | Attending: Physician Assistant | Admitting: Physician Assistant

## 2022-07-05 DIAGNOSIS — R1010 Upper abdominal pain, unspecified: Secondary | ICD-10-CM | POA: Diagnosis not present

## 2022-07-05 DIAGNOSIS — E876 Hypokalemia: Secondary | ICD-10-CM | POA: Insufficient documentation

## 2022-07-05 DIAGNOSIS — E860 Dehydration: Secondary | ICD-10-CM | POA: Diagnosis not present

## 2022-07-05 DIAGNOSIS — R112 Nausea with vomiting, unspecified: Secondary | ICD-10-CM | POA: Diagnosis not present

## 2022-07-05 LAB — POCT URINALYSIS DIPSTICK, ED / UC
Glucose, UA: NEGATIVE mg/dL
Ketones, ur: >=160 mg/dL — AB
Leukocytes,Ua: NEGATIVE
Nitrite: NEGATIVE
Protein, ur: 30 mg/dL — AB
Specific Gravity, Urine: 1.025 (ref 1.005–1.030)
Urobilinogen, UA: 0.2 mg/dL (ref 0.0–1.0)
pH: 6.5 (ref 5.0–8.0)

## 2022-07-05 LAB — COMPREHENSIVE METABOLIC PANEL
ALT: 21 U/L (ref 0–44)
ALT: 23 U/L (ref 0–44)
AST: 25 U/L (ref 15–41)
AST: 23 U/L (ref 15–41)
Albumin: 4.3 g/dL (ref 3.5–5.0)
Albumin: 4.2 g/dL (ref 3.5–5.0)
Alkaline Phosphatase: 54 U/L (ref 38–126)
Alkaline Phosphatase: 54 U/L (ref 38–126)
Anion gap: 15 (ref 5–15)
Anion gap: 15 (ref 5–15)
BUN: 7 mg/dL (ref 6–20)
BUN: 7 mg/dL (ref 6–20)
CO2: 22 mmol/L (ref 22–32)
CO2: 19 mmol/L — ABNORMAL LOW (ref 22–32)
Calcium: 9.9 mg/dL (ref 8.9–10.3)
Calcium: 9.8 mg/dL (ref 8.9–10.3)
Chloride: 98 mmol/L (ref 98–111)
Chloride: 100 mmol/L (ref 98–111)
Creatinine, Ser: 0.6 mg/dL (ref 0.44–1.00)
Creatinine, Ser: 0.67 mg/dL (ref 0.44–1.00)
GFR, Estimated: >60 mL/min (ref >60)
GFR, Estimated: >60 mL/min (ref >60)
Glucose, Bld: 59 mg/dL — ABNORMAL LOW (ref 70–99)
Glucose, Bld: 64 mg/dL — ABNORMAL LOW (ref 70–99)
Potassium: 3.4 mmol/L — ABNORMAL LOW (ref 3.5–5.1)
Potassium: 3.1 mmol/L — ABNORMAL LOW (ref 3.5–5.1)
Sodium: 135 mmol/L (ref 135–145)
Sodium: 134 mmol/L — ABNORMAL LOW (ref 135–145)
Total Bilirubin: 1.1 mg/dL (ref 0.3–1.2)
Total Bilirubin: 1.1 mg/dL (ref 0.3–1.2)
Total Protein: 8.5 g/dL — ABNORMAL HIGH (ref 6.5–8.1)
Total Protein: 8.1 g/dL (ref 6.5–8.1)

## 2022-07-05 LAB — CBC WITH DIFFERENTIAL/PLATELET
Abs Immature Granulocytes: 0.03 10*3/uL (ref 0.00–0.07)
Abs Immature Granulocytes: 0.03 10*3/uL (ref 0.00–0.07)
Basophils Absolute: 0 10*3/uL (ref 0.0–0.1)
Basophils Absolute: 0 10*3/uL (ref 0.0–0.1)
Basophils Relative: 0 %
Basophils Relative: 0 %
Eosinophils Absolute: 0 10*3/uL (ref 0.0–0.5)
Eosinophils Absolute: 0 10*3/uL (ref 0.0–0.5)
Eosinophils Relative: 0 %
Eosinophils Relative: 0 %
HCT: 38.7 % (ref 36.0–46.0)
HCT: 40 % (ref 36.0–46.0)
Hemoglobin: 12.8 g/dL (ref 12.0–15.0)
Hemoglobin: 13 g/dL (ref 12.0–15.0)
Immature Granulocytes: 1 %
Immature Granulocytes: 1 %
Lymphocytes Relative: 30 %
Lymphocytes Relative: 32 %
Lymphs Abs: 1 10*3/uL (ref 0.7–4.0)
Lymphs Abs: 1.2 10*3/uL (ref 0.7–4.0)
MCH: 28.8 pg (ref 26.0–34.0)
MCH: 27.9 pg (ref 26.0–34.0)
MCHC: 33.1 g/dL (ref 30.0–36.0)
MCHC: 32.5 g/dL (ref 30.0–36.0)
MCV: 87 fL (ref 80.0–100.0)
MCV: 85.8 fL (ref 80.0–100.0)
Monocytes Absolute: 0.5 10*3/uL (ref 0.1–1.0)
Monocytes Absolute: 0.7 10*3/uL (ref 0.1–1.0)
Monocytes Relative: 14 %
Monocytes Relative: 19 %
Neutro Abs: 1.8 10*3/uL (ref 1.7–7.7)
Neutro Abs: 1.8 10*3/uL (ref 1.7–7.7)
Neutrophils Relative %: 55 %
Neutrophils Relative %: 48 %
Platelets: 264 10*3/uL (ref 150–400)
Platelets: 245 10*3/uL (ref 150–400)
RBC: 4.45 MIL/uL (ref 3.87–5.11)
RBC: 4.66 MIL/uL (ref 3.87–5.11)
RDW: 14.7 % (ref 11.5–15.5)
RDW: 14.6 % (ref 11.5–15.5)
WBC: 3.3 10*3/uL — ABNORMAL LOW (ref 4.0–10.5)
WBC: 3.6 10*3/uL — ABNORMAL LOW (ref 4.0–10.5)
nRBC: 0 % (ref 0.0–0.2)
nRBC: 0 % (ref 0.0–0.2)

## 2022-07-05 LAB — LIPASE, BLOOD
Lipase: 22 U/L (ref 11–51)
Lipase: 22 U/L (ref 11–51)

## 2022-07-05 LAB — POC URINE PREG, ED: Preg Test, Ur: NEGATIVE

## 2022-07-05 LAB — LACTIC ACID, PLASMA: Lactic Acid, Venous: 1.2 mmol/L (ref 0.5–1.9)

## 2022-07-05 MED ORDER — ONDANSETRON 4 MG PO TBDP: 4.0000 mg | ORAL_TABLET | Freq: Three times a day (TID) | ORAL | 0 refills | Status: DC | PRN

## 2022-07-05 MED ORDER — ONDANSETRON HCL 4 MG/2ML IJ SOLN
4.0000 mg | Freq: Once | INTRAMUSCULAR | Status: AC
  Administered 2022-07-05: 4 mg via INTRAVENOUS
  Filled 2022-07-05: qty 2

## 2022-07-05 MED ORDER — METOCLOPRAMIDE HCL 5 MG/ML IJ SOLN
10.0000 mg | Freq: Once | INTRAMUSCULAR | Status: AC
  Administered 2022-07-05: 10 mg via INTRAVENOUS
  Filled 2022-07-05: qty 2

## 2022-07-05 MED ORDER — ONDANSETRON 4 MG PO TBDP
4.0000 mg | ORAL_TABLET | Freq: Once | ORAL | Status: AC
  Administered 2022-07-05: 4 mg via ORAL

## 2022-07-05 MED ORDER — ONDANSETRON 4 MG PO TBDP
ORAL_TABLET | ORAL | Status: AC
  Filled 2022-07-05: qty 1

## 2022-07-05 MED ORDER — LACTATED RINGERS IV BOLUS
1000.0000 mL | Freq: Once | INTRAVENOUS | Status: AC
  Administered 2022-07-05: 1000 mL via INTRAVENOUS

## 2022-07-05 NOTE — Discharge Instructions (Addendum)
Please follow-up with your primary care doctor regarding this.  Your ultrasound was negative, take Zofran as needed for nausea and vomiting.  Return to the ED for new or concerning symptoms if you are unable to eat or drink.

## 2022-07-05 NOTE — ED Notes (Signed)
Pt ambulated to restroom without assistance.

## 2022-07-05 NOTE — ED Provider Notes (Addendum)
[facility] EMERGENCY DEPARTMENT Provider Note   CSN: [id number] Arrival date & time: 07/05/22  1605     History  Chief Complaint  Patient presents with   Abdominal Pain    Stacy Kelley is a 31 y.o. female.  Patient is a 31 year old female with recent diagnosis of lupus who is prescribed hydroxychloroquine but has not been taking it regularly presenting today with a 7-10-day history of nausea, vomiting and upper abdominal discomfort.  Patient denies any fever, diarrhea but reports anytime she tries to eat or drink anything she vomits.  She reports she has been very stressed and depressed about the recent diagnosis of lupus and is not sure if the stress is the cause for her symptoms.  She does not drink any alcohol, does smoke marijuana fairly regularly but has not recently since she has been ill and has not suffered with issues with nausea and vomiting in the past.  She has not been taking the medication regularly and thinks that that is not the cause of her symptoms.  She denies any lower abdominal pain, fever, dysuria, frequency, urgency or vaginal discharge.  She is on Depo so does not have regular menses.  She denies any cough, congestion or shortness of breath.  She initially went to urgent care who sent her here for further care  The history is provided by the patient and medical records.  Abdominal Pain      Home Medications Prior to Admission medications   Medication Sig Start Date End Date Taking? Authorizing Provider  ondansetron (ZOFRAN-ODT) 4 MG disintegrating tablet Take 1 tablet (4 mg total) by mouth every 8 (eight) hours as needed for nausea or vomiting. 07/05/22  Yes [place], Loree Fee, MD  docusate sodium (COLACE) 100 MG capsule Take 100 mg by mouth daily. 01/15/22   [provider]  hydrocortisone (ANUSOL-HC) 2.5 % rectal cream Place 1 application. rectally 2 (two) times daily. 01/29/22   [provider]  magic mouthwash (nystatin,  lidocaine, diphenhydrAMINE, alum & mag hydroxide) suspension Swish and spit 10 mLs 4 (four) times daily as needed for mouth pain. 02/15/22   Ward, Lenise Arena, PA-C  medroxyPROGESTERone (DEPO-PROVERA) 150 MG/ML injection Inject 150 mg into the muscle every 3 (three) months. 03/12/20   [provider]  triamcinolone cream (KENALOG) 0.1 % Apply 1 application. topically 2 (two) times daily. 01/09/22   [provider]  valACYclovir (VALTREX) 500 MG tablet Take 500 mg by mouth 2 (two) times daily. 01/09/22   [provider]  diphenhydrAMINE (BENADRYL) 25 MG tablet Take 1 tablet (25 mg total) by mouth every 6 (six) hours. Patient not taking: Reported on 05/30/2018 04/08/16 11/11/19  Recardo Evangelist, PA-C  etonogestrel-ethinyl estradiol (NUVARING) 0.12-0.015 MG/24HR vaginal ring Insert vaginally and leave in place for 3 consecutive weeks, then remove for 1 week. Patient not taking: Reported on 04/08/2016 11/22/15 11/11/19  Kandis Cocking A, CNM      Allergies    Augmentin [amoxicillin-pot clavulanate]    Review of Systems   Review of Systems  Gastrointestinal:  Positive for abdominal pain.    Physical Exam Updated Vital Signs BP 114/80   Pulse 65   Temp 98.2 F (36.8 C) (Oral)   Resp (!) 23   SpO2 100%  Physical Exam Vitals and nursing note reviewed.  Constitutional:      General: She is not in acute distress.    Appearance: She is well-developed.  HENT:     Head: Normocephalic and  atraumatic.     Mouth/Throat:     Mouth: Mucous membranes are dry.  Eyes:     Pupils: Pupils are equal, round, and reactive to light.  Cardiovascular:     Rate and Rhythm: Normal rate and regular rhythm.     Heart sounds: Normal heart sounds. No murmur heard.    No friction rub.  Pulmonary:     Effort: Pulmonary effort is normal.     Breath sounds: Normal breath sounds. No wheezing or rales.  Abdominal:     General: Bowel sounds are normal. There is no distension.     Palpations:  Abdomen is soft.     Tenderness: There is abdominal tenderness in the right upper quadrant and epigastric area. There is no guarding or rebound.  Musculoskeletal:        General: No tenderness. Normal range of motion.     Right lower leg: No edema.     Left lower leg: No edema.     Comments: No edema  Skin:    General: Skin is warm and dry.     Findings: No rash.  Neurological:     Mental Status: She is alert and oriented to person, place, and time. Mental status is at baseline.     Cranial Nerves: No cranial nerve deficit.  Psychiatric:        Behavior: Behavior normal.     Comments: Tearful     ED Results / Procedures / Treatments   Labs (all labs ordered are listed, but only abnormal results are displayed) Labs Reviewed  CBC WITH DIFFERENTIAL/PLATELET - Abnormal; Notable for the following components:      Result Value   WBC 3.6 (*)    All other components within normal limits  COMPREHENSIVE METABOLIC PANEL - Abnormal; Notable for the following components:   Sodium 134 (*)    Potassium 3.1 (*)    CO2 19 (*)    Glucose, Bld 64 (*)    All other components within normal limits  LIPASE, BLOOD  LACTIC ACID, PLASMA    EKG None  Radiology US Abdomen Limited RUQ (LIVER/GB)  Result Date: 07/05/2022 CLINICAL DATA:  Right upper quadrant abdominal pain EXAM: ULTRASOUND ABDOMEN LIMITED RIGHT UPPER QUADRANT COMPARISON:  None Available. FINDINGS: Gallbladder: No gallstones or wall thickening visualized. No sonographic Murphy sign noted by sonographer. Common bile duct: Diameter: 2 mm Liver: No focal lesion identified. Within normal limits in parenchymal echogenicity. Portal vein is patent on color Doppler imaging with normal direction of blood flow towards the liver. Other: None. IMPRESSION: Negative right upper quadrant ultrasound. Electronically Signed   By: Julian Hy M.D.   On: 07/05/2022 21:21    Procedures Procedures    Medications Ordered in ED Medications   lactated ringers bolus 1,000 mL (has no administration in time range)  ondansetron (ZOFRAN) injection 4 mg (has no administration in time range)  metoCLOPramide (REGLAN) injection 10 mg (10 mg Intravenous Given 07/05/22 1901)  lactated ringers bolus 1,000 mL (1,000 mLs Intravenous New Bag/Given 07/05/22 1903)    ED Course/ Medical Decision Making/ A&P                           Medical Decision Making Amount and/or Complexity of Data Reviewed Labs: ordered. Radiology: ordered.  Risk Prescription drug management.   Pt with multiple medical problems and comorbidities and presenting today with a complaint that caries a high risk for morbidity and mortality.  Here  today with vomiting and abdominal pain.  Low suspicion for appendicitis, pelvic pathology or pyelonephritis or UTI.  Concern for possible hepatitis, cholecystitis, pancreatitis, gastritis.  Lower suspicion for acute lung pathology such as pneumonia or PE.  I independently interpreted patient's labs today and CBC with persistent leukopenia which is unchanged and stable hemoglobin, CMP with hypokalemia of 3.1 and blood sugar of 64 but normal LFTs and bilirubin, lipase and lactic acid is within normal limits.  Patient had a urine pregnancy test done at urgent care tonight which was negative and UA showed significant ketones but no evidence of infection.  Patient was given IV fluids and antiemetics.  I have independently visualized and interpreted pt's images today. Ultrasound of the right upper quadrantneg for gallbladder pathology.  9:29 PM Pt still having some nausea and given second dose of antiemetic and will po challenge.         Final Clinical Impression(s) / ED Diagnoses Final diagnoses:  Nausea and vomiting, unspecified vomiting type  Dehydration    Rx / DC Orders ED Discharge Orders          Ordered    ondansetron (ZOFRAN-ODT) 4 MG disintegrating tablet  Every 8 hours PRN        07/05/22 2128               Blanchie Dessert, MD 07/05/22 2129    Blanchie Dessert, MD 07/05/22 2130

## 2022-07-05 NOTE — ED Triage Notes (Addendum)
Patient states she is been nauseous for about a week. States she is now vomiting and unable to keep anything down except a few sips of water. Denies diarrhea. Sates she was recently diagnosed with Lupus and is unsure if that is why. States she tried nausea medicine and it has not helped.

## 2022-07-05 NOTE — ED Notes (Signed)
Pt ambulatory to the restroom independently. No acute distress noted and respirations are even and non-labored

## 2022-07-05 NOTE — Discharge Instructions (Signed)
Since you are unable to eat or drink despite medication here I do recommend you go to the emergency room for further evaluation and management as we discussed.

## 2022-07-05 NOTE — ED Notes (Signed)
Patient already had blood work taken from [facility] which she just came from. Did not repeat same labs.

## 2022-07-05 NOTE — ED Provider Notes (Signed)
[place]    CSN: [id number] Arrival date & time: 07/05/22  1341      History   Chief Complaint No chief complaint on file.   HPI Stacy Kelley is a 31 y.o. female.   Patient presents today with a weeklong history of nausea and vomiting.  Reports last episode of emesis was earlier today.  She denies any history of gastrointestinal disorder including acid reflux, pancreatitis, ulcerative colitis, Crohn's disease.  She does have a history of lupus and is recently been started on hydroxychloroquine approximately 1 month ago but denies additional medication changes or antibiotic use.  She denies any known sick contacts, recent travel, suspicious food intake.  She has had a decrease in oral intake as result of symptoms.  Last bowel movement was yesterday and was normal; denies any melena, hematochezia, diarrhea.  She does report upper abdominal pain which is rated 4 on a 0-10 pain scale.  She has tried over-the-counter nausea medication without improvement of symptoms.    Past Medical History:  Diagnosis Date   Genital herpes    Gonorrhea     Patient Active Problem List   Diagnosis Date Noted   Generalized rash 11/23/2015   NSVD (normal spontaneous vaginal delivery) 05/11/2015    Past Surgical History:  Procedure Laterality Date   CESAREAN SECTION      OB History     Gravida  5   Para  2   Term  2   Preterm      AB  3   Living  2      SAB  2   IAB  1   Ectopic      Multiple  0   Live Births  2            Home Medications    Prior to Admission medications   Medication Sig Start Date End Date Taking? Authorizing Provider  docusate sodium (COLACE) 100 MG capsule Take 100 mg by mouth daily. 01/15/22   [provider]  hydrocortisone (ANUSOL-HC) 2.5 % rectal cream Place 1 application. rectally 2 (two) times daily. 01/29/22   [provider]  magic mouthwash (nystatin, lidocaine, diphenhydrAMINE, alum & mag hydroxide)  suspension Swish and spit 10 mLs 4 (four) times daily as needed for mouth pain. 02/15/22   Ward, Lenise Arena, PA-C  medroxyPROGESTERone (DEPO-PROVERA) 150 MG/ML injection Inject 150 mg into the muscle every 3 (three) months. 03/12/20   [provider]  triamcinolone cream (KENALOG) 0.1 % Apply 1 application. topically 2 (two) times daily. 01/09/22   [provider]  valACYclovir (VALTREX) 500 MG tablet Take 500 mg by mouth 2 (two) times daily. 01/09/22   [provider]  diphenhydrAMINE (BENADRYL) 25 MG tablet Take 1 tablet (25 mg total) by mouth every 6 (six) hours. Patient not taking: Reported on 05/30/2018 04/08/16 11/11/19  Recardo Evangelist, PA-C  etonogestrel-ethinyl estradiol (NUVARING) 0.12-0.015 MG/24HR vaginal ring Insert vaginally and leave in place for 3 consecutive weeks, then remove for 1 week. Patient not taking: Reported on 04/08/2016 11/22/15 11/11/19  Morene Crocker, CNM    Family History Family History  Problem Relation Age of Onset   Diabetes Maternal Grandmother    Diabetes Maternal Grandfather    Diabetes Paternal Grandmother    Hypertension Paternal Grandfather    Diabetes Paternal Grandfather     Social History Social History   Tobacco Use   Smoking status: Never   Smokeless tobacco: Never  Substance Use  Topics   Alcohol use: No    Alcohol/week: 0.0 standard drinks of alcohol   Drug use: No     Allergies   Augmentin [amoxicillin-pot clavulanate]   Review of Systems Review of Systems  Constitutional:  Positive for activity change. Negative for appetite change, fatigue and fever.  Respiratory:  Negative for cough and shortness of breath.   Cardiovascular:  Negative for chest pain.  Gastrointestinal:  Positive for abdominal pain, nausea and vomiting. Negative for blood in stool, constipation and diarrhea.  Genitourinary:  Negative for dysuria, frequency, urgency, vaginal bleeding, vaginal discharge and vaginal pain.  Neurological:   Negative for dizziness, light-headedness and headaches.     Physical Exam Triage Vital Signs ED Triage Vitals  Enc Vitals Group     BP 07/05/22 1413 110/72     Pulse Rate 07/05/22 1413 73     Resp 07/05/22 1413 20     Temp 07/05/22 1413 98.9 F (37.2 C)     Temp Source 07/05/22 1413 Oral     SpO2 07/05/22 1413 98 %     Weight --      Height --      Head Circumference --      Peak Flow --      Pain Score 07/05/22 1415 0     Pain Loc --      Pain Edu? --      Excl. in GC? --    No data found.  Updated Vital Signs BP 110/72 (BP Location: Left Arm)   Pulse 73   Temp 98.9 F (37.2 C) (Oral)   Resp 20   SpO2 98%   Visual Acuity Right Eye Distance:   Left Eye Distance:   Bilateral Distance:    Right Eye Near:   Left Eye Near:    Bilateral Near:     Physical Exam Vitals reviewed.  Constitutional:      General: She is awake. She is not in acute distress.    Appearance: Normal appearance. She is well-developed. She is not ill-appearing.     Comments: Very pleasant female appears stated age in no acute distress sitting comfortably in exam room  HENT:     Head: Normocephalic and atraumatic.     Mouth/Throat:     Pharynx: Uvula midline. No oropharyngeal exudate or posterior oropharyngeal erythema.  Cardiovascular:     Rate and Rhythm: Normal rate and regular rhythm.     Heart sounds: Normal heart sounds, S1 normal and S2 normal. No murmur heard. Pulmonary:     Effort: Pulmonary effort is normal.     Breath sounds: Normal breath sounds. No wheezing, rhonchi or rales.     Comments: Clear to auscultation bilaterally Abdominal:     General: Bowel sounds are normal.     Palpations: Abdomen is soft.     Tenderness: There is abdominal tenderness in the right upper quadrant and epigastric area. There is no right CVA tenderness, left CVA tenderness, guarding or rebound. Negative signs include Murphy's sign.     Comments: Tenderness palpation throughout abdomen but worse in  epigastrium and right upper quadrant.  Negative Murphy sign.  No evidence of acute abdomen on physical exam.  Psychiatric:        Behavior: Behavior is cooperative.      UC Treatments / Results  Labs (all labs ordered are listed, but only abnormal results are displayed) Labs Reviewed  POCT URINALYSIS DIPSTICK, ED / UC - Abnormal; Notable for the following components:  Result Value   Bilirubin Urine SMALL (*)    Ketones, ur >=160 (*)    Hgb urine dipstick TRACE (*)    Protein, ur 30 (*)    All other components within normal limits  CBC WITH DIFFERENTIAL/PLATELET  COMPREHENSIVE METABOLIC PANEL  LIPASE, BLOOD  POC URINE PREG, ED    EKG   Radiology No results found.  Procedures Procedures (including critical care time)  Medications Ordered in UC Medications  ondansetron (ZOFRAN-ODT) disintegrating tablet 4 mg (4 mg Oral Given 07/05/22 1443)    Initial Impression / Assessment and Plan / UC Course  I have reviewed the triage vital signs and the nursing notes.  Pertinent labs & imaging results that were available during my care of the patient were reviewed by me and considered in my medical decision making (see chart for details).     UA showed ketones and evidence of decreased oral intake without evidence of infection.  Urine pregnancy was negative.  CBC, CMP, lipase obtained today-results pending.  Patient was given Zofran in clinic but despite medication was unable to pass oral challenge with recurrent vomiting with even a few sips of water.  Discussed that given her intractable nausea/vomiting she will need to be evaluated in the emergency room.  She is agreeable to this and will go directly to [facility], ER.  Her vital signs are stable at the time of discharge and she is safe for private transport.  Final Clinical Impressions(s) / UC Diagnoses   Final diagnoses:  Intractable nausea and vomiting  Upper abdominal pain     Discharge Instructions      Since you  are unable to eat or drink despite medication here I do recommend you go to the emergency room for further evaluation and management as we discussed.     ED Prescriptions   None    PDMP not reviewed this encounter.   Jeani Hawking, PA-C 07/05/22 1546

## 2022-07-05 NOTE — ED Notes (Signed)
Received verbal report from Jacquelyn D RN at this time 

## 2022-07-05 NOTE — ED Triage Notes (Signed)
Patient here from [facility] w/ complaints of feeling nauseated and vomiting for about a week now. Unable to keep PO food down. Patient endorsing upper abdominal pain to the R side. Was given ODT zofran at [facility] but this has not helped.

## 2022-07-05 NOTE — ED Notes (Signed)
Returned from US at this time.

## 2022-07-22 NOTE — Progress Notes (Deleted)
Office Visit Note  Patient: Stacy Kelley             Date of Birth: 07/17/1991           MRN: [id number]             PCP: [facility] Referring: Juanda Chance Visit Date: 07/23/2022 Occupation: @GUAROCC @  Subjective:  No chief complaint on file.   History of Present Illness: Stacy Kelley is a 31 y.o. female here for evaluation of positive ANA with multiple positive antibody markers associated with angioedema, facial rash, and joint pains. She saw [facility] earlier this month with new diagnosis of lupus and started on hydroxychloroquine. History and findings at that time with angioedema, fatigue, arthralgias, mucosal ulcers, and lab abnormalities. ***   Labs reviewed 06/2022 ANA 1:1280 Complement C4 <8 UA protein 30 ACA neg B2GP1 neg  04/2022 ANA pos RNP >8.0 SSA > 8.0 SSB > 8.0 dsDNA, Sm neg ESR 95 CRP 42 CK 73  Activities of Daily Living:  Patient reports morning stiffness for *** {minute/hour:19697}.   Patient {ACTIONS;DENIES/REPORTS:21021675::"Denies"} nocturnal pain.  Difficulty dressing/grooming: {ACTIONS;DENIES/REPORTS:21021675::"Denies"} Difficulty climbing stairs: {ACTIONS;DENIES/REPORTS:21021675::"Denies"} Difficulty getting out of chair: {ACTIONS;DENIES/REPORTS:21021675::"Denies"} Difficulty using hands for taps, buttons, cutlery, and/or writing: {ACTIONS;DENIES/REPORTS:21021675::"Denies"}  No Rheumatology ROS completed.   PMFS History:  Patient Active Problem List   Diagnosis Date Noted   Generalized rash 11/23/2015   NSVD (normal spontaneous vaginal delivery) 05/11/2015    Past Medical History:  Diagnosis Date   Genital herpes    Gonorrhea     Family History  Problem Relation Age of Onset   Diabetes Maternal Grandmother    Diabetes Maternal Grandfather    Diabetes Paternal Grandmother    Hypertension Paternal Grandfather    Diabetes Paternal Grandfather    Past Surgical History:  Procedure  Laterality Date   CESAREAN SECTION     Social History   Social History Narrative   Not on file   There is no immunization history for the selected administration types on file for this patient.   Objective: Vital Signs: There were no vitals taken for this visit.   Physical Exam   Musculoskeletal Exam: ***  CDAI Exam: CDAI Score: -- Patient Global: --; Provider Global: -- Swollen: --; Tender: -- Joint Exam 07/23/2022   No joint exam has been documented for this visit   There is currently no information documented on the homunculus. Go to the Rheumatology activity and complete the homunculus joint exam.  Investigation: No additional findings.  Imaging: US Abdomen Limited RUQ (LIVER/GB)  Result Date: 07/05/2022 CLINICAL DATA:  Right upper quadrant abdominal pain EXAM: ULTRASOUND ABDOMEN LIMITED RIGHT UPPER QUADRANT COMPARISON:  None Available. FINDINGS: Gallbladder: No gallstones or wall thickening visualized. No sonographic Murphy sign noted by sonographer. Common bile duct: Diameter: 2 mm Liver: No focal lesion identified. Within normal limits in parenchymal echogenicity. Portal vein is patent on color Doppler imaging with normal direction of blood flow towards the liver. Other: None. IMPRESSION: Negative right upper quadrant ultrasound. Electronically Signed   By: Julian Hy M.D.   On: 07/05/2022 21:21    Recent Labs: Lab Results  Component Value Date   WBC 3.6 (L) 07/05/2022   HGB 13.0 07/05/2022   PLT 245 07/05/2022   NA 134 (L) 07/05/2022   K 3.1 (L) 07/05/2022   CL 100 07/05/2022   CO2 19 (L) 07/05/2022   GLUCOSE 64 (L) 07/05/2022   BUN 7 07/05/2022  CREATININE 0.67 07/05/2022   BILITOT 1.1 07/05/2022   ALKPHOS 54 07/05/2022   AST 23 07/05/2022   ALT 23 07/05/2022   PROT 8.1 07/05/2022   ALBUMIN 4.2 07/05/2022   CALCIUM 9.8 07/05/2022   GFRAA >90 12/29/2013    Speciality Comments: No specialty comments available.  Procedures:  No procedures  performed Allergies: Augmentin [amoxicillin-pot clavulanate]   Assessment / Plan:     Visit Diagnoses: No diagnosis found.  Orders: No orders of the defined types were placed in this encounter.  No orders of the defined types were placed in this encounter.   Face-to-face time spent with patient was *** minutes. Greater than 50% of time was spent in counseling and coordination of care.  Follow-Up Instructions: No follow-ups on file.   Collier Salina, MD  Note - This record has been created using [employer].  Chart creation errors have been sought, but may not always  have been located. Such creation errors do not reflect on  the standard of medical care.

## 2022-07-23 ENCOUNTER — Ambulatory Visit: Payer: Medicaid Other | Attending: Internal Medicine | Admitting: Internal Medicine

## 2022-08-13 ENCOUNTER — Ambulatory Visit: Payer: Medicaid Other | Admitting: Internal Medicine

## 2022-09-03 DIAGNOSIS — E010 Iodine-deficiency related diffuse (endemic) goiter: Secondary | ICD-10-CM

## 2022-09-03 HISTORY — DX: Iodine-deficiency related diffuse (endemic) goiter: E01.0

## 2023-08-07 ENCOUNTER — Emergency Department (HOSPITAL_COMMUNITY)
Admission: EM | Admit: 2023-08-07 | Discharge: 2023-08-07 | Disposition: A | Discharge status: Home / Self Care | Payer: Medicaid Other | Attending: Emergency Medicine | Admitting: Emergency Medicine

## 2023-08-07 ENCOUNTER — Encounter (HOSPITAL_COMMUNITY): Payer: Self-pay

## 2023-08-07 ENCOUNTER — Other Ambulatory Visit: Payer: Self-pay

## 2023-08-07 DIAGNOSIS — L02411 Cutaneous abscess of right axilla: Secondary | ICD-10-CM | POA: Insufficient documentation

## 2023-08-07 MED ORDER — LIDOCAINE HCL 2 % IJ SOLN
10.0000 mL | Freq: Once | INTRAMUSCULAR | Status: AC
  Administered 2023-08-07: 200 mg
  Filled 2023-08-07: qty 20

## 2023-08-07 MED ORDER — HYDROCODONE-ACETAMINOPHEN 5-325 MG PO TABS
1.0000 | ORAL_TABLET | Freq: Once | ORAL | Status: AC
  Administered 2023-08-07: 1 via ORAL
  Filled 2023-08-07: qty 1

## 2023-08-07 MED ORDER — DOXYCYCLINE HYCLATE 100 MG PO TABS
100.0000 mg | ORAL_TABLET | Freq: Once | ORAL | Status: AC
  Administered 2023-08-07: 100 mg via ORAL
  Filled 2023-08-07: qty 1

## 2023-08-07 MED ORDER — DOXYCYCLINE HYCLATE 100 MG PO CAPS: 100.0000 mg | ORAL_CAPSULE | Freq: Two times a day (BID) | ORAL | 0 refills | Status: DC

## 2023-08-07 MED ORDER — LIDOCAINE-EPINEPHRINE-TETRACAINE (LET) TOPICAL GEL
3.0000 mL | Freq: Once | TOPICAL | Status: AC
  Administered 2023-08-07: 3 mL via TOPICAL
  Filled 2023-08-07 (×2): qty 3

## 2023-08-07 NOTE — ED Provider Notes (Signed)
[place] EMERGENCY DEPARTMENT AT [facility] Provider Note   CSN: [id number] Arrival date & time: 08/07/23  1748     History  Chief Complaint  Patient presents with   Cyst    Stacy Kelley is a 32 y.o. female with medical history of genital herpes and gonorrhea.  Patient presents to the ED for evaluation of abscess to her right axilla.  States that it began last Sunday and has progressively gotten larger over this time.  Denies history of the same.  Denies fevers, nausea or vomiting at home.  Denies warm compresses.  HPI     Home Medications Prior to Admission medications   Medication Sig Start Date End Date Taking? Authorizing Provider  doxycycline (VIBRAMYCIN) 100 MG capsule Take 1 capsule (100 mg total) by mouth 2 (two) times daily. 08/07/23  Yes Al Decant, PA-C  docusate sodium (COLACE) 100 MG capsule Take 100 mg by mouth daily. 01/15/22   [provider]  hydrocortisone (ANUSOL-HC) 2.5 % rectal cream Place 1 application. rectally 2 (two) times daily. 01/29/22   [provider]  magic mouthwash (nystatin, lidocaine, diphenhydrAMINE, alum & mag hydroxide) suspension Swish and spit 10 mLs 4 (four) times daily as needed for mouth pain. 02/15/22   Ward, Tylene Fantasia, PA-C  medroxyPROGESTERone (DEPO-PROVERA) 150 MG/ML injection Inject 150 mg into the muscle every 3 (three) months. 03/12/20   [provider]  ondansetron (ZOFRAN-ODT) 4 MG disintegrating tablet Take 1 tablet (4 mg total) by mouth every 8 (eight) hours as needed for nausea or vomiting. 07/05/22   Gwyneth Sprout, MD  triamcinolone cream (KENALOG) 0.1 % Apply 1 application. topically 2 (two) times daily. 01/09/22   [provider]  valACYclovir (VALTREX) 500 MG tablet Take 500 mg by mouth 2 (two) times daily. 01/09/22   [provider]  diphenhydrAMINE (BENADRYL) 25 MG tablet Take 1 tablet (25 mg total) by mouth every 6 (six) hours. Patient not taking: Reported  on 05/30/2018 04/08/16 11/11/19  Bethel Born, PA-C  etonogestrel-ethinyl estradiol (NUVARING) 0.12-0.015 MG/24HR vaginal ring Insert vaginally and leave in place for 3 consecutive weeks, then remove for 1 week. Patient not taking: Reported on 04/08/2016 11/22/15 11/11/19  Orvilla Cornwall A, CNM      Allergies    Augmentin [amoxicillin-pot clavulanate]    Review of Systems   Review of Systems  Skin:        Abscess  All other systems reviewed and are negative.   Physical Exam Updated Vital Signs BP 112/80 (BP Location: Left Arm)   Pulse (!) 101   Temp 98.7 F (37.1 C) (Oral)   Resp 18   Ht 5\' 2"  (1.575 m)   Wt 52.2 kg   SpO2 97%   BMI 21.03 kg/m  Physical Exam Vitals and nursing note reviewed.  Constitutional:      General: She is not in acute distress.    Appearance: Normal appearance. She is not ill-appearing, toxic-appearing or diaphoretic.  HENT:     Head: Normocephalic and atraumatic.     Nose: Nose normal.     Mouth/Throat:     Mouth: Mucous membranes are moist.     Pharynx: Oropharynx is clear.  Eyes:     Extraocular Movements: Extraocular movements intact.     Conjunctiva/sclera: Conjunctivae normal.     Pupils: Pupils are equal, round, and reactive to light.  Cardiovascular:     Rate and Rhythm: Normal rate and regular rhythm.  Pulmonary:  Effort: Pulmonary effort is normal.     Breath sounds: Normal breath sounds. No wheezing.  Abdominal:     General: Abdomen is flat. Bowel sounds are normal.     Palpations: Abdomen is soft.     Tenderness: There is no abdominal tenderness.  Musculoskeletal:     Cervical back: Normal range of motion and neck supple. No tenderness.  Skin:    General: Skin is warm and dry.     Capillary Refill: Capillary refill takes less than 2 seconds.     Comments: 1 inch x 2 inch abscess with fluctuance, erythema to right axilla.  No drainage.  See photo.  Neurological:     Mental Status: She is alert and oriented to person,  place, and time.     ED Results / Procedures / Treatments   Labs (all labs ordered are listed, but only abnormal results are displayed) Labs Reviewed - No data to display  EKG None  Radiology No results found.  Procedures .Marland KitchenIncision and Drainage  Date/Time: 08/07/2023 10:43 PM  Performed by: Al Decant, PA-C Authorized by: Al Decant, PA-C   Consent:    Consent obtained:  Verbal   Consent given by:  Patient   Risks, benefits, and alternatives were discussed: yes     Risks discussed:  Bleeding, damage to other organs, infection, incomplete drainage and pain   Alternatives discussed:  No treatment Universal protocol:    Patient identity confirmed:  Verbally with patient and arm band Location:    Type:  Abscess   Size:  1 'x 2'   Location: Right axilla. Pre-procedure details:    Skin preparation:  Povidone-iodine Anesthesia:    Anesthesia method:  Topical application and local infiltration   Topical anesthetic:  LET   Local anesthetic:  Lidocaine 2% WITH epi Procedure type:    Complexity:  Simple Procedure details:    Incision types:  Stab incision   Wound management:  Probed and deloculated and irrigated with saline   Drainage:  Purulent and bloody   Drainage amount:  Moderate   Wound treatment:  Wound left open   Packing materials:  1/2 in iodoform gauze Post-procedure details:    Procedure completion:  Tolerated well, no immediate complications    Medications Ordered in ED Medications  lidocaine (XYLOCAINE) 2 % (with pres) injection 200 mg (has no administration in time range)  doxycycline (VIBRA-TABS) tablet 100 mg (has no administration in time range)  lidocaine-EPINEPHrine-tetracaine (LET) topical gel (3 mLs Topical Given 08/07/23 2121)  HYDROcodone-acetaminophen (NORCO/VICODIN) 5-325 MG per tablet 1 tablet (1 tablet Oral Given 08/07/23 2121)    ED Course/ Medical Decision Making/ A&P  Medical Decision Making Risk Prescription drug  management.   32 year old female presents to ED for evaluation.  Please see HPI for further details.  On exam the patient has 1 inch by 2 inch area of induration, fluctuance to her right axilla consistent with abscess.  Patient is overall nontoxic in appearance.  Provided 5 mg hydrocodone for pain control.  Please see procedure note for further details.  Patient has abscess packed.  Patient will return in 2 days for packing removal and wound check.  Will start her on doxycycline.  Initial dose given here in the department.  Patient stable to discharge.  Advised to treat pain with ibuprofen and Tylenol.   Final Clinical Impression(s) / ED Diagnoses Final diagnoses:  Abscess of axilla, right    Rx / DC Orders ED Discharge Orders  Ordered    doxycycline (VIBRAMYCIN) 100 MG capsule  2 times daily        08/07/23 2246              Clent Ridges 08/07/23 2247    Linwood Dibbles, MD 08/08/23 1504

## 2023-08-07 NOTE — ED Triage Notes (Signed)
Patient noticed a cyst under her right armpit earlier this week. Denies drainage. Painful to touch. 2 inches long and 1 inch wide.

## 2023-08-07 NOTE — Discharge Instructions (Addendum)
Please begin taking antibiotic doxycycline twice a day for the next 7 days.  Please return in 2 days for removal of packing as well as wound check.  Please take ibuprofen and Tylenol for pain control at home.  Please keep wound covered with gauze provided to you.  You may read attached guide concerning skin abscess.

## 2023-08-08 ENCOUNTER — Encounter (HOSPITAL_COMMUNITY): Payer: Self-pay | Admitting: Emergency Medicine

## 2023-08-08 ENCOUNTER — Other Ambulatory Visit: Payer: Self-pay

## 2023-08-08 ENCOUNTER — Emergency Department (HOSPITAL_COMMUNITY)
Admission: EM | Admit: 2023-08-08 | Discharge: 2023-08-08 | Disposition: A | Discharge status: Home / Self Care | Payer: Medicaid Other | Attending: Emergency Medicine | Admitting: Emergency Medicine

## 2023-08-08 DIAGNOSIS — R791 Abnormal coagulation profile: Secondary | ICD-10-CM | POA: Diagnosis not present

## 2023-08-08 DIAGNOSIS — R21 Rash and other nonspecific skin eruption: Secondary | ICD-10-CM | POA: Diagnosis present

## 2023-08-08 LAB — COMPREHENSIVE METABOLIC PANEL
ALT: 12 U/L (ref 0–44)
AST: 20 U/L (ref 15–41)
Albumin: 3.8 g/dL (ref 3.5–5.0)
Alkaline Phosphatase: 53 U/L (ref 38–126)
Anion gap: 8 (ref 5–15)
BUN: 6 mg/dL (ref 6–20)
CO2: 22 mmol/L (ref 22–32)
Calcium: 9 mg/dL (ref 8.9–10.3)
Chloride: 106 mmol/L (ref 98–111)
Creatinine, Ser: 0.49 mg/dL (ref 0.44–1.00)
GFR, Estimated: >60 mL/min (ref >60)
Glucose, Bld: 89 mg/dL (ref 70–99)
Potassium: 3.1 mmol/L — ABNORMAL LOW (ref 3.5–5.1)
Sodium: 136 mmol/L (ref 135–145)
Total Bilirubin: 0.9 mg/dL (ref <1.2)
Total Protein: 8.3 g/dL — ABNORMAL HIGH (ref 6.5–8.1)

## 2023-08-08 LAB — CBC WITH DIFFERENTIAL/PLATELET
Abs Immature Granulocytes: 0.02 10*3/uL (ref 0.00–0.07)
Basophils Absolute: 0 10*3/uL (ref 0.0–0.1)
Basophils Relative: 0 %
Eosinophils Absolute: 0 10*3/uL (ref 0.0–0.5)
Eosinophils Relative: 0 %
HCT: 36.7 % (ref 36.0–46.0)
Hemoglobin: 11.6 g/dL — ABNORMAL LOW (ref 12.0–15.0)
Immature Granulocytes: 0 %
Lymphocytes Relative: 28 %
Lymphs Abs: 1.3 10*3/uL (ref 0.7–4.0)
MCH: 27.6 pg (ref 26.0–34.0)
MCHC: 31.6 g/dL (ref 30.0–36.0)
MCV: 87.4 fL (ref 80.0–100.0)
Monocytes Absolute: 0.4 10*3/uL (ref 0.1–1.0)
Monocytes Relative: 8 %
Neutro Abs: 2.8 10*3/uL (ref 1.7–7.7)
Neutrophils Relative %: 64 %
Platelets: 241 10*3/uL (ref 150–400)
RBC: 4.2 MIL/uL (ref 3.87–5.11)
RDW: 14.5 % (ref 11.5–15.5)
WBC: 4.5 10*3/uL (ref 4.0–10.5)
nRBC: 0 % (ref 0.0–0.2)

## 2023-08-08 LAB — PROTIME-INR
INR: 1 (ref 0.8–1.2)
Prothrombin Time: 13.5 s (ref 11.4–15.2)

## 2023-08-08 MED ORDER — HYDROCORTISONE 1 % EX CREA: TOPICAL_CREAM | CUTANEOUS | 0 refills | Status: DC

## 2023-08-08 MED ORDER — CLINDAMYCIN HCL 300 MG PO CAPS: 300.0000 mg | ORAL_CAPSULE | Freq: Three times a day (TID) | ORAL | 0 refills | Status: DC

## 2023-08-08 MED ORDER — CLINDAMYCIN HCL 300 MG PO CAPS: 300.0000 mg | ORAL_CAPSULE | Freq: Three times a day (TID) | ORAL | 0 refills | Status: AC

## 2023-08-08 NOTE — ED Notes (Signed)
Patient Alert and oriented to baseline. Stable and ambulatory to baseline. Patient verbalized understanding of the discharge instructions.  Patient belongings were taken by the patient.   

## 2023-08-08 NOTE — ED Provider Notes (Signed)
EMERGENCY DEPARTMENT AT [facility] Provider Note   CSN: [id number] Arrival date & time: 08/08/23  2595     History {Add pertinent medical, surgical, social history, OB history to HPI:1} Chief Complaint  Patient presents with   Rash    Stacy Kelley is a 32 y.o. female with past medical history of lupus presents to emergency department for evaluation of rash on her lower extremities bilaterally.  She denies pruritus, fevers, injury.  She was seen yesterday for an abscess in her right axilla and provided doxycycline in ED.  She had not yet picked up prescription.   Rash Associated symptoms: no abdominal pain, no diarrhea, no fatigue, no fever, no headaches, no nausea, no shortness of breath, not vomiting and not wheezing        Home Medications Prior to Admission medications   Medication Sig Start Date End Date Taking? Authorizing Provider  docusate sodium (COLACE) 100 MG capsule Take 100 mg by mouth daily. 01/15/22   [provider]  doxycycline (VIBRAMYCIN) 100 MG capsule Take 1 capsule (100 mg total) by mouth 2 (two) times daily. 08/07/23   Al Decant, PA-C  hydrocortisone (ANUSOL-HC) 2.5 % rectal cream Place 1 application. rectally 2 (two) times daily. 01/29/22   [provider]  magic mouthwash (nystatin, lidocaine, diphenhydrAMINE, alum & mag hydroxide) suspension Swish and spit 10 mLs 4 (four) times daily as needed for mouth pain. 02/15/22   Ward, Tylene Fantasia, PA-C  medroxyPROGESTERone (DEPO-PROVERA) 150 MG/ML injection Inject 150 mg into the muscle every 3 (three) months. 03/12/20   [provider]  ondansetron (ZOFRAN-ODT) 4 MG disintegrating tablet Take 1 tablet (4 mg total) by mouth every 8 (eight) hours as needed for nausea or vomiting. 07/05/22   Gwyneth Sprout, MD  triamcinolone cream (KENALOG) 0.1 % Apply 1 application. topically 2 (two) times daily. 01/09/22   [provider]  valACYclovir (VALTREX) 500  MG tablet Take 500 mg by mouth 2 (two) times daily. 01/09/22   [provider]  diphenhydrAMINE (BENADRYL) 25 MG tablet Take 1 tablet (25 mg total) by mouth every 6 (six) hours. Patient not taking: Reported on 05/30/2018 04/08/16 11/11/19  Bethel Born, PA-C  etonogestrel-ethinyl estradiol (NUVARING) 0.12-0.015 MG/24HR vaginal ring Insert vaginally and leave in place for 3 consecutive weeks, then remove for 1 week. Patient not taking: Reported on 04/08/2016 11/22/15 11/11/19  Orvilla Cornwall A, CNM      Allergies    Augmentin [amoxicillin-pot clavulanate]    Review of Systems   Review of Systems  Constitutional:  Negative for chills, fatigue and fever.  Respiratory:  Negative for cough, chest tightness, shortness of breath and wheezing.   Cardiovascular:  Negative for chest pain and palpitations.  Gastrointestinal:  Negative for abdominal pain, constipation, diarrhea, nausea and vomiting.  Skin:  Positive for rash.  Neurological:  Negative for dizziness, seizures, weakness, light-headedness, numbness and headaches.    Physical Exam Updated Vital Signs BP (!) 151/101   Pulse 84   Temp 98.2 F (36.8 C) (Oral)   Resp 16   Ht 5\' 2"  (1.575 m)   Wt 52.2 kg   SpO2 98%   BMI 21.03 kg/m  Physical Exam Vitals and nursing note reviewed.  Constitutional:      General: She is not in acute distress.    Appearance: Normal appearance.  HENT:     Head: Normocephalic and atraumatic.  Eyes:     Conjunctiva/sclera: Conjunctivae normal.  Cardiovascular:  Rate and Rhythm: Normal rate.  Pulmonary:     Effort: Pulmonary effort is normal. No respiratory distress.  Skin:    Capillary Refill: Capillary refill takes less than 2 seconds.     Coloration: Skin is not jaundiced or pale.     Comments: Nonblanching purpura on thighs, calfs, ankles bilaterally Negative Nikolsky sign  Neurological:     Mental Status: She is alert and oriented to person, place, and time. Mental status is at  baseline.     ED Results / Procedures / Treatments   Labs (all labs ordered are listed, but only abnormal results are displayed) Labs Reviewed  CBC WITH DIFFERENTIAL/PLATELET  COMPREHENSIVE METABOLIC PANEL  PROTIME-INR    EKG None  Radiology No results found.  Procedures Procedures  {Document cardiac monitor, telemetry assessment procedure when appropriate:1}  Medications Ordered in ED Medications - No data to display  ED Course/ Medical Decision Making/ A&P   {   Click here for ABCD2, HEART and other calculatorsREFRESH Note before signing :1}                              Medical Decision Making Amount and/or Complexity of Data Reviewed Labs: ordered.  Risk Prescription drug management.    Patient presents to the ED for concern of ***, this involves an extensive number of treatment options, and is a complaint that carries with it a high risk of complications and morbidity.  The differential diagnosis includes ***   Co morbidities that complicate the patient evaluation  ***   Additional history obtained:  Additional history obtained from *** {Blank multiple:19196::"EMS","Family","Nursing","Outside Medical Records","Past Admission"}   External records from outside source obtained and reviewed including ***   Lab Tests:  I Ordered, and personally interpreted labs.  The pertinent results include:  ***   Imaging Studies ordered:  I ordered imaging studies including ***  I independently visualized and interpreted imaging which showed *** I agree with the radiologist interpretation   Cardiac Monitoring:  The patient was maintained on a cardiac monitor.  I personally viewed and interpreted the cardiac monitored which showed an underlying rhythm of: ***   Medicines ordered and prescription drug management:  I ordered medication including ***  for ***  Reevaluation of the patient after these medicines showed that the patient  {resolved/improved/worsened:23923::"improved"} I have reviewed the patients home medicines and have made adjustments as needed   Test Considered:  ***   Critical Interventions:  ***   Consultations Obtained:  I requested consultation with the ***,  and discussed lab and imaging findings as well as pertinent plan - they recommend: ***   Problem List / ED Course:  ***   Reevaluation:  After the interventions noted above, I reevaluated the patient and found that they have :{resolved/improved/worsened:23923::"improved"}   Social Determinants of Health:  ***   Dispostion:  After consideration of the diagnostic results and the patients response to treatment, I feel that the patent would benefit from ***.    {Document critical care time when appropriate:1} {Document review of labs and clinical decision tools ie heart score, Chads2Vasc2 etc:1}  {Document your independent review of radiology images, and any outside records:1} {Document your discussion with family members, caretakers, and with consultants:1} {Document social determinants of health affecting pt's care:1} {Document your decision making why or why not admission, treatments were needed:1} Final Clinical Impression(s) / ED Diagnoses Final diagnoses:  None    Rx / DC  Orders ED Discharge Orders     None

## 2023-08-08 NOTE — ED Triage Notes (Signed)
Patient arrives ambulatory by POV states she was here yesterday for a cyst under her right armpit. Patient states she was given antibiotics and is now having spots on her legs and feet.

## 2023-08-08 NOTE — Discharge Instructions (Addendum)
Thank you for allowing [place] to evaluate you today. Your lab workup was not significant for low blood levels requiring intervention, elevated white blood cells to indicate infection, or significantly abnormal electrolytes. Please follow up with your PCP regarding medication management of Lupus and rash if it does not improve with steroid cream, lupus medication, and new antibiotic. I have sent steroid cream to your [facility] pharmacy on [address] Please apply to areas twice a day.  Please pick up doxy (antibiotic) today and start taking it today (once in morning and once at night) for abscess in armpit  Please return to ED for complaints of shortness of breath, chest pain.

## 2023-08-08 NOTE — Care Management (Signed)
Consult for medication assistance, the patient has insurance therefore is ineligible for [facility] medication assistance Provider aware.

## 2023-08-10 ENCOUNTER — Emergency Department (HOSPITAL_COMMUNITY)
Admission: EM | Admit: 2023-08-10 | Discharge: 2023-08-10 | Disposition: A | Discharge status: Home / Self Care | Payer: Medicaid Other | Attending: Emergency Medicine | Admitting: Emergency Medicine

## 2023-08-10 ENCOUNTER — Encounter (HOSPITAL_COMMUNITY): Payer: Self-pay

## 2023-08-10 ENCOUNTER — Other Ambulatory Visit: Payer: Self-pay

## 2023-08-10 DIAGNOSIS — Z5189 Encounter for other specified aftercare: Secondary | ICD-10-CM

## 2023-08-10 DIAGNOSIS — Z4800 Encounter for change or removal of nonsurgical wound dressing: Secondary | ICD-10-CM | POA: Insufficient documentation

## 2023-08-10 NOTE — ED Triage Notes (Signed)
Patient is here for evaluation for a follow up after having a cyst removed here a few days ago. Pt denies any pain to it.

## 2023-08-10 NOTE — Discharge Instructions (Signed)
You can clean the area with warm soap and water. Finish the antibiotics as prescribed.   Return to the ED with any concerning symptoms.

## 2023-08-10 NOTE — ED Provider Notes (Signed)
[place] EMERGENCY DEPARTMENT AT [facility] Provider Note   CSN: [id number] Arrival date & time: 08/10/23  1642     History  Chief Complaint  Patient presents with   Cyst    Stacy Kelley is a 32 y.o. female.  Patient had abscess opened and drained 3 days ago and presents for recheck. No fever. Reports no increased pain.   The history is provided by the patient. No language interpreter was used.       Home Medications Prior to Admission medications   Medication Sig Start Date End Date Taking? Authorizing Provider  clindamycin (CLEOCIN) 300 MG capsule Take 1 capsule (300 mg total) by mouth 3 (three) times daily for 7 days. 08/08/23 08/15/23  Judithann Sheen, PA  docusate sodium (COLACE) 100 MG capsule Take 100 mg by mouth daily. 01/15/22   [provider]  hydrocortisone (ANUSOL-HC) 2.5 % rectal cream Place 1 application. rectally 2 (two) times daily. 01/29/22   [provider]  hydrocortisone cream 1 % Apply to affected area 2 times daily 08/08/23   Judithann Sheen, PA  magic mouthwash (nystatin, lidocaine, diphenhydrAMINE, alum & mag hydroxide) suspension Swish and spit 10 mLs 4 (four) times daily as needed for mouth pain. 02/15/22   Ward, Tylene Fantasia, PA-C  medroxyPROGESTERone (DEPO-PROVERA) 150 MG/ML injection Inject 150 mg into the muscle every 3 (three) months. 03/12/20   [provider]  ondansetron (ZOFRAN-ODT) 4 MG disintegrating tablet Take 1 tablet (4 mg total) by mouth every 8 (eight) hours as needed for nausea or vomiting. 07/05/22   Gwyneth Sprout, MD  triamcinolone cream (KENALOG) 0.1 % Apply 1 application. topically 2 (two) times daily. 01/09/22   [provider]  valACYclovir (VALTREX) 500 MG tablet Take 500 mg by mouth 2 (two) times daily. 01/09/22   [provider]  diphenhydrAMINE (BENADRYL) 25 MG tablet Take 1 tablet (25 mg total) by mouth every 6 (six) hours. Patient not taking: Reported on 05/30/2018 04/08/16  11/11/19  Bethel Born, PA-C  etonogestrel-ethinyl estradiol (NUVARING) 0.12-0.015 MG/24HR vaginal ring Insert vaginally and leave in place for 3 consecutive weeks, then remove for 1 week. Patient not taking: Reported on 04/08/2016 11/22/15 11/11/19  Roe Coombs, CNM      Allergies    Augmentin [amoxicillin-pot clavulanate]    Review of Systems   Review of Systems  Physical Exam Updated Vital Signs BP (!) 153/82 (BP Location: Left Arm)   Pulse 77   Temp 98.7 F (37.1 C) (Oral)   Resp 16   Ht 5\' 2"  (1.575 m)   Wt 52.2 kg   SpO2 99%   BMI 21.05 kg/m  Physical Exam Vitals and nursing note reviewed.  Constitutional:      Appearance: Normal appearance.  Skin:    Findings: No erythema.     Comments: Right axilla: abscess with packing present. No erythema. Soft, no induration or fluctuance.   Neurological:     Mental Status: She is alert.     ED Results / Procedures / Treatments   Labs (all labs ordered are listed, but only abnormal results are displayed) Labs Reviewed - No data to display  EKG None  Radiology No results found.  Procedures Procedures    Medications Ordered in ED Medications - No data to display  ED Course/ Medical Decision Making/ A&P Clinical Course as of 08/10/23 1801  Mon Aug 10, 2023  1800 Packing removed. Discussed completion of antibiotic. Can wash with soap and  water. Return if worsens.  [SU]    Clinical Course User Index [SU] Elpidio Anis, PA-C                                 Medical Decision Making          Final Clinical Impression(s) / ED Diagnoses Final diagnoses:  Wound check, abscess    Rx / DC Orders ED Discharge Orders     None         Elpidio Anis, PA-C 08/10/23 1801    Terald Sleeper, MD 08/10/23 2037

## 2023-09-06 ENCOUNTER — Encounter (HOSPITAL_COMMUNITY): Payer: Self-pay

## 2023-09-06 ENCOUNTER — Other Ambulatory Visit: Payer: Self-pay

## 2023-09-06 ENCOUNTER — Emergency Department (HOSPITAL_COMMUNITY)
Admission: EM | Admit: 2023-09-06 | Discharge: 2023-09-06 | Disposition: A | Discharge status: Home / Self Care | Payer: Medicaid Other | Attending: Emergency Medicine | Admitting: Emergency Medicine

## 2023-09-06 DIAGNOSIS — L02411 Cutaneous abscess of right axilla: Secondary | ICD-10-CM | POA: Diagnosis not present

## 2023-09-06 DIAGNOSIS — R202 Paresthesia of skin: Secondary | ICD-10-CM

## 2023-09-06 DIAGNOSIS — E876 Hypokalemia: Secondary | ICD-10-CM

## 2023-09-06 HISTORY — DX: Systemic lupus erythematosus, unspecified: M32.9

## 2023-09-06 LAB — CBC WITH DIFFERENTIAL/PLATELET
Abs Immature Granulocytes: 0.02 10*3/uL (ref 0.00–0.07)
Basophils Absolute: 0 10*3/uL (ref 0.0–0.1)
Basophils Relative: 0 %
Eosinophils Absolute: 0 10*3/uL (ref 0.0–0.5)
Eosinophils Relative: 1 %
HCT: 38.1 % (ref 36.0–46.0)
Hemoglobin: 12.6 g/dL (ref 12.0–15.0)
Immature Granulocytes: 1 %
Lymphocytes Relative: 33 %
Lymphs Abs: 1.2 10*3/uL (ref 0.7–4.0)
MCH: 28.5 pg (ref 26.0–34.0)
MCHC: 33.1 g/dL (ref 30.0–36.0)
MCV: 86.2 fL (ref 80.0–100.0)
Monocytes Absolute: 0.3 10*3/uL (ref 0.1–1.0)
Monocytes Relative: 8 %
Neutro Abs: 2.1 10*3/uL (ref 1.7–7.7)
Neutrophils Relative %: 57 %
Platelets: 258 10*3/uL (ref 150–400)
RBC: 4.42 MIL/uL (ref 3.87–5.11)
RDW: 14 % (ref 11.5–15.5)
WBC: 3.6 10*3/uL — ABNORMAL LOW (ref 4.0–10.5)
nRBC: 0 % (ref 0.0–0.2)

## 2023-09-06 LAB — COMPREHENSIVE METABOLIC PANEL
ALT: 12 U/L (ref 0–44)
AST: 18 U/L (ref 15–41)
Albumin: 4.3 g/dL (ref 3.5–5.0)
Alkaline Phosphatase: 59 U/L (ref 38–126)
Anion gap: 15 (ref 5–15)
BUN: 14 mg/dL (ref 6–20)
CO2: 18 mmol/L — ABNORMAL LOW (ref 22–32)
Calcium: 10 mg/dL (ref 8.9–10.3)
Chloride: 102 mmol/L (ref 98–111)
Creatinine, Ser: 0.57 mg/dL (ref 0.44–1.00)
GFR, Estimated: >60 mL/min (ref >60)
Glucose, Bld: 76 mg/dL (ref 70–99)
Potassium: 3.1 mmol/L — ABNORMAL LOW (ref 3.5–5.1)
Sodium: 135 mmol/L (ref 135–145)
Total Bilirubin: 1 mg/dL (ref <1.2)
Total Protein: 9.4 g/dL — ABNORMAL HIGH (ref 6.5–8.1)

## 2023-09-06 LAB — URINALYSIS, ROUTINE W REFLEX MICROSCOPIC
Bilirubin Urine: NEGATIVE
Glucose, UA: NEGATIVE mg/dL
Ketones, ur: 80 mg/dL — AB
Nitrite: NEGATIVE
Protein, ur: 100 mg/dL — AB
Specific Gravity, Urine: 1.028 (ref 1.005–1.030)
pH: 6 (ref 5.0–8.0)

## 2023-09-06 LAB — HCG, SERUM, QUALITATIVE: Preg, Serum: NEGATIVE

## 2023-09-06 LAB — LIPASE, BLOOD: Lipase: 24 U/L (ref 11–51)

## 2023-09-06 MED ORDER — SODIUM CHLORIDE 0.9 % IV BOLUS
1000.0000 mL | Freq: Once | INTRAVENOUS | Status: AC
  Administered 2023-09-06: 1000 mL via INTRAVENOUS

## 2023-09-06 MED ORDER — METHYLPREDNISOLONE SODIUM SUCC 125 MG IJ SOLR
125.0000 mg | Freq: Once | INTRAMUSCULAR | Status: AC
  Administered 2023-09-06: 125 mg via INTRAVENOUS
  Filled 2023-09-06: qty 2

## 2023-09-06 MED ORDER — ONDANSETRON 4 MG PO TBDP: 4.0000 mg | ORAL_TABLET | Freq: Three times a day (TID) | ORAL | 0 refills | Status: DC | PRN

## 2023-09-06 MED ORDER — POTASSIUM CHLORIDE CRYS ER 20 MEQ PO TBCR: 40.0000 meq | EXTENDED_RELEASE_TABLET | Freq: Every day | ORAL | 0 refills | Status: DC

## 2023-09-06 NOTE — ED Notes (Signed)
Pt on bedside commode.

## 2023-09-06 NOTE — ED Provider Notes (Signed)
[place] EMERGENCY DEPARTMENT AT [facility] Provider Note  CSN: [id number] Arrival date & time: 09/06/23 5643  Chief Complaint(s) myalgia  HPI Stacy Kelley is a 32 y.o. female with a past medical history listed below including SLE prescribed Plaquenil but patient is noncompliant with a who presents to the emergency department for myalgias consistent with lupus flare.  Patient was seen by her family medicine clinic 2 days ago for the same, given Depo-Medrol shot and prescribed oral prednisone.  She reports that she is not been able to pick up the prednisone prescription yet.  She describes myalgias and paresthesias from the abdomen to her lower extremities.  She describes numbness and tingling in her extremities.  Denies any falls or trauma.  No nausea or vomiting.  No urinary changes.  No bladder/bowel incontinence.  Patient recently had abscess of the right axilla I&D in November.  No other recent infections.  The patient was prescribed antibiotics at the time but only took the dose given to her in the emergency department as she developed a rash after this.  She reports that the abscess had healed without the antibiotics.  She did report that she is developing a new lump that is nontender.  HPI  Past Medical History Past Medical History:  Diagnosis Date   Genital herpes    Gonorrhea    SLE (systemic lupus erythematosus) (HCC)    Patient Active Problem List   Diagnosis Date Noted   Generalized rash 11/23/2015   NSVD (normal spontaneous vaginal delivery) 05/11/2015   Home Medication(s) Prior to Admission medications   Medication Sig Start Date End Date Taking? Authorizing Provider  docusate sodium (COLACE) 100 MG capsule Take 100 mg by mouth daily. 01/15/22   [provider]  hydrocortisone (ANUSOL-HC) 2.5 % rectal cream Place 1 application. rectally 2 (two) times daily. 01/29/22   [provider]  hydrocortisone cream 1 % Apply to affected area 2 times  daily 08/08/23   Judithann Sheen, PA  magic mouthwash (nystatin, lidocaine, diphenhydrAMINE, alum & mag hydroxide) suspension Swish and spit 10 mLs 4 (four) times daily as needed for mouth pain. 02/15/22   Ward, Tylene Fantasia, PA-C  medroxyPROGESTERone (DEPO-PROVERA) 150 MG/ML injection Inject 150 mg into the muscle every 3 (three) months. 03/12/20   [provider]  ondansetron (ZOFRAN-ODT) 4 MG disintegrating tablet Take 1 tablet (4 mg total) by mouth every 8 (eight) hours as needed for nausea or vomiting. 07/05/22   Gwyneth Sprout, MD  triamcinolone cream (KENALOG) 0.1 % Apply 1 application. topically 2 (two) times daily. 01/09/22   [provider]  valACYclovir (VALTREX) 500 MG tablet Take 500 mg by mouth 2 (two) times daily. 01/09/22   [provider]  diphenhydrAMINE (BENADRYL) 25 MG tablet Take 1 tablet (25 mg total) by mouth every 6 (six) hours. Patient not taking: Reported on 05/30/2018 04/08/16 11/11/19  Bethel Born, PA-C  etonogestrel-ethinyl estradiol (NUVARING) 0.12-0.015 MG/24HR vaginal ring Insert vaginally and leave in place for 3 consecutive weeks, then remove for 1 week. Patient not taking: Reported on 04/08/2016 11/22/15 11/11/19  Roe Coombs, CNM  Allergies Augmentin [amoxicillin-pot clavulanate]  Review of Systems Review of Systems As noted in HPI  Physical Exam Vital Signs  I have reviewed the triage vital signs BP 129/87   Pulse 88   Temp 98.2 F (36.8 C) (Oral)   Resp 17   Ht 5\' 2"  (1.575 m)   Wt 43.1 kg   SpO2 100%   BMI 17.38 kg/m   Physical Exam Vitals reviewed.  Constitutional:      General: She is not in acute distress.    Appearance: She is well-developed. She is not diaphoretic.  HENT:     Head: Normocephalic and atraumatic.     Right Ear: External ear normal.     Left Ear: External ear  normal.     Nose: Nose normal.  Eyes:     General: No scleral icterus.    Conjunctiva/sclera: Conjunctivae normal.  Neck:     Trachea: Phonation normal.  Cardiovascular:     Rate and Rhythm: Normal rate and regular rhythm.  Pulmonary:     Effort: Pulmonary effort is normal. No respiratory distress.     Breath sounds: No stridor.  Abdominal:     General: There is no distension.     Tenderness: There is abdominal tenderness in the right upper quadrant, epigastric area and periumbilical area. There is right CVA tenderness. There is no guarding or rebound.  Musculoskeletal:        General: Normal range of motion.     Cervical back: Normal range of motion.  Skin:    Findings: Abscess present.       Neurological:     Mental Status: She is alert and oriented to person, place, and time.     Cranial Nerves: Cranial nerves 2-12 are intact.     Sensory: Sensory deficit (decreased pinprick sensation to LLE, but light touch intact) present.     Motor: No weakness, abnormal muscle tone or seizure activity.  Psychiatric:        Behavior: Behavior normal.     ED Results and Treatments Labs (all labs ordered are listed, but only abnormal results are displayed) Labs Reviewed  COMPREHENSIVE METABOLIC PANEL  LIPASE, BLOOD  CBC WITH DIFFERENTIAL/PLATELET  HCG, SERUM, QUALITATIVE  URINALYSIS, ROUTINE W REFLEX MICROSCOPIC                                                                                                                         EKG  EKG Interpretation Date/Time:    Ventricular Rate:    PR Interval:    QRS Duration:    QT Interval:    QTC Calculation:   R Axis:      Text Interpretation:         Radiology No results found.  Medications Ordered in ED Medications  sodium chloride 0.9 % bolus 1,000 mL (has no administration in time range)  methylPREDNISolone sodium succinate (SOLU-MEDROL) 125 mg/2 mL injection 125 mg (has no administration in time range)  Procedures Procedures  (including critical care time) Medical Decision Making / ED Course   Medical Decision Making Amount and/or Complexity of Data Reviewed Labs: ordered. Decision-making details documented in ED Course.    Myalgia and paresthesia Likely related to SLE flare. Iv solumedrol given Will get labs to assess for electrolyte deragements Given abdominal TTP, will get labs to assess intraabdominal inflammatory/infectious process.  Right axillary abscess No need for I&D at this time. Offered Abx, but patient declined Warm compress recommended   Patient care turned over to oncoming provider. Patient case and results discussed in detail; please see their note for further ED managment.    Final Clinical Impression(s) / ED Diagnoses Final diagnoses:  None    This chart was dictated using voice recognition software.  Despite best efforts to proofread,  errors can occur which can change the documentation meaning.    Nira Conn, MD 09/06/23 [phone]

## 2023-09-06 NOTE — ED Triage Notes (Signed)
Pt has Lupus and states that she had sudden onset of bilateral leg "numbness and pain" x 5 days ago. Pt saw PMD on Friday and was given steroid that did not help. Pt needed assistance to get from wheelchair to bed.

## 2023-09-06 NOTE — Discharge Instructions (Signed)
Please take your medication prescribed previously including your steroids. Please follow closely with your PCP and return with any new or worsening symptoms.

## 2023-09-06 NOTE — ED Notes (Signed)
Pt put on bedpan to try get urine sample

## 2023-09-06 NOTE — ED Provider Notes (Signed)
Blood pressure 129/87, pulse 88, temperature 98.2 F (36.8 C), temperature source Oral, resp. rate 17, height 5\' 2"  (1.575 m), weight 43.1 kg, SpO2 100%.  Assuming care from Dr. Eudelia Bunch.  In short, Stacy Kelley is a 32 y.o. female with a chief complaint of myalgia .  Refer to the original H&P for additional details.  The current plan of care is to follow up on labs and reassess.  10:53 AM  CBC without leukocytosis or anemia.  No acute kidney injury.  Mild hypokalemia at 3.1.  No severe proteinuria.  Pregnancy negative.  Discussed with patient that she would fill and start her steroid course prescribed previously.  Will supplement potassium for several days and sent home with nausea medications.  Advised that she follow closely with her PCP and rheumatologist.  Discussed tricked ED return precautions.  She is comfortable with the plan at discharge.   Maia Plan, MD 09/06/23 1054

## 2023-09-06 NOTE — ED Notes (Signed)
Pt placed on bedpan but unable to give urine sample.

## 2023-09-08 DIAGNOSIS — L02411 Cutaneous abscess of right axilla: Secondary | ICD-10-CM | POA: Diagnosis present

## 2023-09-08 DIAGNOSIS — E8809 Other disorders of plasma-protein metabolism, not elsewhere classified: Secondary | ICD-10-CM | POA: Diagnosis present

## 2023-09-08 DIAGNOSIS — R1013 Epigastric pain: Secondary | ICD-10-CM | POA: Diagnosis not present

## 2023-09-08 DIAGNOSIS — R112 Nausea with vomiting, unspecified: Secondary | ICD-10-CM | POA: Diagnosis not present

## 2023-09-08 DIAGNOSIS — Z880 Allergy status to penicillin: Secondary | ICD-10-CM

## 2023-09-08 DIAGNOSIS — T380X5A Adverse effect of glucocorticoids and synthetic analogues, initial encounter: Secondary | ICD-10-CM | POA: Diagnosis not present

## 2023-09-08 DIAGNOSIS — E876 Hypokalemia: Secondary | ICD-10-CM | POA: Diagnosis present

## 2023-09-08 DIAGNOSIS — Z539 Procedure and treatment not carried out, unspecified reason: Secondary | ICD-10-CM | POA: Diagnosis present

## 2023-09-08 DIAGNOSIS — D509 Iron deficiency anemia, unspecified: Secondary | ICD-10-CM | POA: Diagnosis present

## 2023-09-08 DIAGNOSIS — Z8249 Family history of ischemic heart disease and other diseases of the circulatory system: Secondary | ICD-10-CM

## 2023-09-08 DIAGNOSIS — L03113 Cellulitis of right upper limb: Secondary | ICD-10-CM | POA: Diagnosis present

## 2023-09-08 DIAGNOSIS — Z833 Family history of diabetes mellitus: Secondary | ICD-10-CM

## 2023-09-08 DIAGNOSIS — Z79899 Other long term (current) drug therapy: Secondary | ICD-10-CM

## 2023-09-08 DIAGNOSIS — K5900 Constipation, unspecified: Secondary | ICD-10-CM | POA: Diagnosis present

## 2023-09-08 DIAGNOSIS — Z888 Allergy status to other drugs, medicaments and biological substances status: Secondary | ICD-10-CM

## 2023-09-08 DIAGNOSIS — Z881 Allergy status to other antibiotic agents status: Secondary | ICD-10-CM

## 2023-09-08 DIAGNOSIS — G373 Acute transverse myelitis in demyelinating disease of central nervous system: Principal | ICD-10-CM | POA: Diagnosis present

## 2023-09-08 DIAGNOSIS — Z91148 Patient's other noncompliance with medication regimen for other reason: Secondary | ICD-10-CM

## 2023-09-08 DIAGNOSIS — M329 Systemic lupus erythematosus, unspecified: Secondary | ICD-10-CM | POA: Diagnosis present

## 2023-09-08 DIAGNOSIS — M3500 Sicca syndrome, unspecified: Secondary | ICD-10-CM | POA: Diagnosis present

## 2023-09-09 ENCOUNTER — Emergency Department (HOSPITAL_COMMUNITY): Payer: Medicaid Other

## 2023-09-09 ENCOUNTER — Encounter (HOSPITAL_COMMUNITY): Payer: Self-pay

## 2023-09-09 ENCOUNTER — Other Ambulatory Visit: Payer: Self-pay

## 2023-09-09 ENCOUNTER — Inpatient Hospital Stay (HOSPITAL_COMMUNITY): Payer: Medicaid Other

## 2023-09-09 ENCOUNTER — Inpatient Hospital Stay (HOSPITAL_COMMUNITY)
Admission: EM | Admit: 2023-09-09 | Discharge: 2023-09-16 | DRG: 098 | Disposition: A | Discharge status: Rehabilitation Facility | Payer: Medicaid Other | Attending: Internal Medicine | Admitting: Internal Medicine

## 2023-09-09 DIAGNOSIS — G0491 Myelitis, unspecified: Secondary | ICD-10-CM | POA: Diagnosis not present

## 2023-09-09 DIAGNOSIS — T380X5A Adverse effect of glucocorticoids and synthetic analogues, initial encounter: Secondary | ICD-10-CM | POA: Diagnosis not present

## 2023-09-09 DIAGNOSIS — G373 Acute transverse myelitis in demyelinating disease of central nervous system: Secondary | ICD-10-CM | POA: Diagnosis not present

## 2023-09-09 DIAGNOSIS — D509 Iron deficiency anemia, unspecified: Secondary | ICD-10-CM | POA: Diagnosis present

## 2023-09-09 DIAGNOSIS — K5900 Constipation, unspecified: Secondary | ICD-10-CM | POA: Diagnosis present

## 2023-09-09 DIAGNOSIS — Z833 Family history of diabetes mellitus: Secondary | ICD-10-CM | POA: Diagnosis not present

## 2023-09-09 DIAGNOSIS — E8809 Other disorders of plasma-protein metabolism, not elsewhere classified: Secondary | ICD-10-CM | POA: Diagnosis present

## 2023-09-09 DIAGNOSIS — Z880 Allergy status to penicillin: Secondary | ICD-10-CM | POA: Diagnosis not present

## 2023-09-09 DIAGNOSIS — Z539 Procedure and treatment not carried out, unspecified reason: Secondary | ICD-10-CM | POA: Diagnosis present

## 2023-09-09 DIAGNOSIS — L02411 Cutaneous abscess of right axilla: Secondary | ICD-10-CM | POA: Diagnosis present

## 2023-09-09 DIAGNOSIS — M3500 Sicca syndrome, unspecified: Secondary | ICD-10-CM | POA: Diagnosis present

## 2023-09-09 DIAGNOSIS — L03113 Cellulitis of right upper limb: Secondary | ICD-10-CM | POA: Diagnosis present

## 2023-09-09 DIAGNOSIS — M329 Systemic lupus erythematosus, unspecified: Secondary | ICD-10-CM | POA: Diagnosis present

## 2023-09-09 DIAGNOSIS — Z888 Allergy status to other drugs, medicaments and biological substances status: Secondary | ICD-10-CM | POA: Diagnosis not present

## 2023-09-09 DIAGNOSIS — Z881 Allergy status to other antibiotic agents status: Secondary | ICD-10-CM | POA: Diagnosis not present

## 2023-09-09 DIAGNOSIS — R1013 Epigastric pain: Secondary | ICD-10-CM | POA: Diagnosis not present

## 2023-09-09 DIAGNOSIS — R112 Nausea with vomiting, unspecified: Secondary | ICD-10-CM | POA: Diagnosis not present

## 2023-09-09 DIAGNOSIS — E876 Hypokalemia: Secondary | ICD-10-CM | POA: Diagnosis present

## 2023-09-09 DIAGNOSIS — Z8249 Family history of ischemic heart disease and other diseases of the circulatory system: Secondary | ICD-10-CM | POA: Diagnosis not present

## 2023-09-09 DIAGNOSIS — Z91148 Patient's other noncompliance with medication regimen for other reason: Secondary | ICD-10-CM | POA: Diagnosis not present

## 2023-09-09 DIAGNOSIS — Z79899 Other long term (current) drug therapy: Secondary | ICD-10-CM | POA: Diagnosis not present

## 2023-09-09 LAB — LIPASE, BLOOD: Lipase: 25 U/L (ref 11–51)

## 2023-09-09 LAB — CBC WITH DIFFERENTIAL/PLATELET
Abs Immature Granulocytes: 0.03 10*3/uL (ref 0.00–0.07)
Basophils Absolute: 0 10*3/uL (ref 0.0–0.1)
Basophils Relative: 0 %
Eosinophils Absolute: 0.1 10*3/uL (ref 0.0–0.5)
Eosinophils Relative: 2 %
HCT: 33.5 % — ABNORMAL LOW (ref 36.0–46.0)
Hemoglobin: 11.1 g/dL — ABNORMAL LOW (ref 12.0–15.0)
Immature Granulocytes: 1 %
Lymphocytes Relative: 26 %
Lymphs Abs: 1.6 10*3/uL (ref 0.7–4.0)
MCH: 28.2 pg (ref 26.0–34.0)
MCHC: 33.1 g/dL (ref 30.0–36.0)
MCV: 85.2 fL (ref 80.0–100.0)
Monocytes Absolute: 0.6 10*3/uL (ref 0.1–1.0)
Monocytes Relative: 10 %
Neutro Abs: 3.6 10*3/uL (ref 1.7–7.7)
Neutrophils Relative %: 61 %
Platelets: 290 10*3/uL (ref 150–400)
RBC: 3.93 MIL/uL (ref 3.87–5.11)
RDW: 14 % (ref 11.5–15.5)
WBC: 5.9 10*3/uL (ref 4.0–10.5)
nRBC: 0 % (ref 0.0–0.2)

## 2023-09-09 LAB — URINALYSIS, W/ REFLEX TO CULTURE (INFECTION SUSPECTED)
Bacteria, UA: NONE SEEN
Bilirubin Urine: NEGATIVE
Glucose, UA: NEGATIVE mg/dL
Hgb urine dipstick: NEGATIVE
Ketones, ur: 5 mg/dL — AB
Leukocytes,Ua: NEGATIVE
Nitrite: NEGATIVE
Protein, ur: NEGATIVE mg/dL
Specific Gravity, Urine: >1.046 — ABNORMAL HIGH (ref 1.005–1.030)
pH: 6 (ref 5.0–8.0)

## 2023-09-09 LAB — COMPREHENSIVE METABOLIC PANEL
ALT: 10 U/L (ref 0–44)
AST: 14 U/L — ABNORMAL LOW (ref 15–41)
Albumin: 3.9 g/dL (ref 3.5–5.0)
Alkaline Phosphatase: 52 U/L (ref 38–126)
Anion gap: 9 (ref 5–15)
BUN: 12 mg/dL (ref 6–20)
CO2: 22 mmol/L (ref 22–32)
Calcium: 9.7 mg/dL (ref 8.9–10.3)
Chloride: 104 mmol/L (ref 98–111)
Creatinine, Ser: 0.55 mg/dL (ref 0.44–1.00)
GFR, Estimated: >60 mL/min (ref >60)
Glucose, Bld: 106 mg/dL — ABNORMAL HIGH (ref 70–99)
Potassium: 2.9 mmol/L — ABNORMAL LOW (ref 3.5–5.1)
Sodium: 135 mmol/L (ref 135–145)
Total Bilirubin: 0.5 mg/dL (ref <1.2)
Total Protein: 8.9 g/dL — ABNORMAL HIGH (ref 6.5–8.1)

## 2023-09-09 LAB — CSF CELL COUNT WITH DIFFERENTIAL
RBC Count, CSF: 0 /mm3
Tube #: 4
WBC, CSF: 2 /mm3 (ref 0–5)

## 2023-09-09 LAB — MAGNESIUM: Magnesium: 2.3 mg/dL (ref 1.7–2.4)

## 2023-09-09 LAB — PHOSPHORUS: Phosphorus: 3 mg/dL (ref 2.5–4.6)

## 2023-09-09 LAB — PROTEIN, CSF: Total  Protein, CSF: 52 mg/dL — ABNORMAL HIGH (ref 15–45)

## 2023-09-09 LAB — GLUCOSE, CSF: Glucose, CSF: 40 mg/dL (ref 40–70)

## 2023-09-09 MED ORDER — MAGNESIUM SULFATE 2 GM/50ML IV SOLN
2.0000 g | Freq: Once | INTRAVENOUS | Status: AC
  Administered 2023-09-09: 2 g via INTRAVENOUS
  Filled 2023-09-09: qty 50

## 2023-09-09 MED ORDER — SODIUM CHLORIDE 0.9 % IV SOLN
1000.0000 mg | Freq: Once | INTRAVENOUS | Status: AC
  Administered 2023-09-09: 1000 mg via INTRAVENOUS
  Filled 2023-09-09: qty 16

## 2023-09-09 MED ORDER — ACETAMINOPHEN 325 MG PO TABS
650.0000 mg | ORAL_TABLET | Freq: Four times a day (QID) | ORAL | Status: DC | PRN
  Administered 2023-09-12 – 2023-09-14 (×2): 650 mg via ORAL
  Filled 2023-09-09 (×3): qty 2

## 2023-09-09 MED ORDER — ACETAMINOPHEN 650 MG RE SUPP: 650.0000 mg | Freq: Four times a day (QID) | RECTAL | Status: DC | PRN

## 2023-09-09 MED ORDER — ENOXAPARIN SODIUM 30 MG/0.3ML IJ SOSY: 30.0000 mg | PREFILLED_SYRINGE | INTRAMUSCULAR | Status: DC

## 2023-09-09 MED ORDER — LORAZEPAM 2 MG/ML IJ SOLN
1.0000 mg | Freq: Once | INTRAMUSCULAR | Status: AC
  Administered 2023-09-09: 1 mg via INTRAVENOUS
  Filled 2023-09-09: qty 1

## 2023-09-09 MED ORDER — POTASSIUM CHLORIDE CRYS ER 20 MEQ PO TBCR
40.0000 meq | EXTENDED_RELEASE_TABLET | Freq: Every day | ORAL | Status: AC
  Administered 2023-09-09: 40 meq via ORAL
  Filled 2023-09-09: qty 2

## 2023-09-09 MED ORDER — GADOBUTROL 1 MMOL/ML IV SOLN
4.0000 mL | Freq: Once | INTRAVENOUS | Status: AC | PRN
  Administered 2023-09-09: 4 mL via INTRAVENOUS

## 2023-09-09 MED ORDER — HYDROMORPHONE HCL 1 MG/ML IJ SOLN
0.5000 mg | INTRAMUSCULAR | Status: AC | PRN
  Administered 2023-09-09 (×3): 0.5 mg via INTRAVENOUS
  Filled 2023-09-09: qty 0.5
  Filled 2023-09-09 (×2): qty 1

## 2023-09-09 MED ORDER — IOHEXOL 300 MG/ML  SOLN
80.0000 mL | Freq: Once | INTRAMUSCULAR | Status: AC | PRN
  Administered 2023-09-09: 80 mL via INTRAVENOUS

## 2023-09-09 MED ORDER — ONDANSETRON HCL 4 MG PO TABS: 4.0000 mg | ORAL_TABLET | Freq: Four times a day (QID) | ORAL | Status: DC | PRN

## 2023-09-09 MED ORDER — ONDANSETRON HCL 4 MG/2ML IJ SOLN
4.0000 mg | Freq: Four times a day (QID) | INTRAMUSCULAR | Status: DC | PRN
  Administered 2023-09-10: 4 mg via INTRAVENOUS
  Filled 2023-09-09: qty 2

## 2023-09-09 MED ORDER — SODIUM CHLORIDE 0.9 % IV BOLUS
1000.0000 mL | Freq: Once | INTRAVENOUS | Status: AC
  Administered 2023-09-09: 1000 mL via INTRAVENOUS

## 2023-09-09 MED ORDER — POTASSIUM CHLORIDE CRYS ER 20 MEQ PO TBCR
40.0000 meq | EXTENDED_RELEASE_TABLET | Freq: Once | ORAL | Status: AC
  Administered 2023-09-09: 40 meq via ORAL
  Filled 2023-09-09: qty 2

## 2023-09-09 NOTE — ED Notes (Signed)
Called to give report, receiving RN with critical patient and will call back

## 2023-09-09 NOTE — Consult Note (Signed)
NEUROLOGY CONSULT NOTE   Date of service: September 09, 2023 Patient Name: Stacy Kelley MRN:  [id number] DOB:  December 03, 1990 Chief Complaint: "leg pain and weakness" Requesting Provider: Bobette Mo, MD  History of Present Illness  Stacy Kelley is a 32 y.o. female with a past medical history significant for lupus (diagnosed in 2023, on Plaquenil but noncompliant), vitamin B6 deficiency, genital herpes, gonorrhea, substance syndrome who presented 12/11 to the [facility] ED complaining of progressing BLE numbness, pain and weakness for the past 5 days. She saw her PMD on Friday and was given a Depo-Medrol shot that did not help (was also prescribed prednisone but did start taking that prescription until Monday). Patient's weakness and sensory symptoms have steadily been worsening and now she has numbness from her lower torso to her lower extremities.  She has also developed urinary frequency and incontinence. MRI revealed a longitudinally extensive spinal cord lesion spanning T1-T9. CT head was negative for acute process. CBC showed no leukocytosis.   The day after her visit to her PCP, she had presented to the ED 12/8 with c/o myalgia and was discharged with a steroid course. Prior to this, in November, she had presented to the ED d/t an abscess in her right axilla with swollen lymph nodes. This was drained and she was sent home on doxycycline. She had a slight rash on her legs after starting the doxycycline, which was then changed to clindamycin just in case it was due to a drug reaction.    On exam today, she is alert, oriented and able to give a clear history. She has decreased sensation from her mid-abdomen region throughout both legs. BLE are weak.  She has a rash on her feet that is red and flat in appearance.  She states this started on Tuesday.  She was seen by a physician at her primary care office on Friday and was given prednisone, which she starting taking Monday.  She endorses  recent weight loss over the last few weeks.    ROS  Comprehensive ROS performed and pertinent positives documented in HPI   Past History   Past Medical History:  Diagnosis Date   Genital herpes    Gonorrhea    Sjogren's syndrome (HCC) 05/12/2022   SLE (systemic lupus erythematosus) (HCC)    Thyromegaly 09/03/2022   Vitamin D deficiency 06/11/2022    Past Surgical History:  Procedure Laterality Date   CESAREAN SECTION      Family History: Family History  Problem Relation Age of Onset   Diabetes Maternal Grandmother    Diabetes Maternal Grandfather    Diabetes Paternal Grandmother    Hypertension Paternal Grandfather    Diabetes Paternal Grandfather     Social History  reports that she has never smoked. She has never used smokeless tobacco. She reports that she does not drink alcohol and does not use drugs.  Allergies  Allergen Reactions   Augmentin [Amoxicillin-Pot Clavulanate] Swelling and Rash   Penicillins Swelling and Rash    Medications   Current Facility-Administered Medications:    acetaminophen (TYLENOL) tablet 650 mg, 650 mg, Oral, Q6H PRN **OR** acetaminophen (TYLENOL) suppository 650 mg, 650 mg, Rectal, Q6H PRN, Bobette Mo, MD   [START ON 09/10/2023] enoxaparin (LOVENOX) injection 30 mg, 30 mg, Subcutaneous, Q24H, Bobette Mo, MD   HYDROmorphone (DILAUDID) injection 0.5 mg, 0.5 mg, Intravenous, Q2H PRN, Cardama, Amadeo Garnet, MD, 0.5 mg at 09/09/23 0742   ondansetron (ZOFRAN) tablet 4 mg, 4 mg,  Oral, Q6H PRN **OR** ondansetron (ZOFRAN) injection 4 mg, 4 mg, Intravenous, Q6H PRN, Bobette Mo, MD   potassium chloride SA (KLOR-CON M) CR tablet 40 mEq, 40 mEq, Oral, Q supper, Bobette Mo, MD  Current Outpatient Medications:    carboxymethylcellulose (REFRESH PLUS) 0.5 % SOLN, Place 1 drop into both eyes 3 (three) times daily as needed (dry eyes)., Disp: , Rfl:    docusate sodium (COLACE) 100 MG capsule, Take 100 mg by mouth  daily., Disp: , Rfl:    ferrous gluconate (FERGON) 324 MG tablet, Take 324 mg by mouth daily., Disp: , Rfl:    hydrocortisone (ANUSOL-HC) 2.5 % rectal cream, Place 1 application. rectally 2 (two) times daily., Disp: , Rfl:    hydrocortisone cream 1 %, Apply to affected area 2 times daily (Patient taking differently: Apply 1 Application topically 2 (two) times daily.), Disp: 20 g, Rfl: 0   hydroxychloroquine (PLAQUENIL) 200 MG tablet, Take 200 mg by mouth daily., Disp: , Rfl:    ibuprofen (ADVIL) 800 MG tablet, Take 800 mg by mouth every 8 (eight) hours as needed., Disp: , Rfl:    medroxyPROGESTERone (DEPO-PROVERA) 150 MG/ML injection, Inject 150 mg into the muscle every 3 (three) months., Disp: , Rfl:    ondansetron (ZOFRAN-ODT) 4 MG disintegrating tablet, Take 1 tablet (4 mg total) by mouth every 8 (eight) hours as needed., Disp: 20 tablet, Rfl: 0   potassium chloride SA (KLOR-CON M) 20 MEQ tablet, Take 2 tablets (40 mEq total) by mouth daily for 3 days., Disp: 6 tablet, Rfl: 0   predniSONE (DELTASONE) 20 MG tablet, Take 20 mg by mouth daily., Disp: , Rfl:    triamcinolone cream (KENALOG) 0.1 %, Apply 1 application. topically 2 (two) times daily., Disp: , Rfl:   Vitals   Vitals:   09/09/23 1118 09/09/23 1123 09/09/23 1145 09/09/23 1506  BP:  132/89 (!) 145/79 (!) 138/113  Pulse:  90 76 (!) 109  Resp:  16 18 20   Temp: 97.9 F (36.6 C)   97.9 F (36.6 C)  TempSrc: Oral   Oral  SpO2:  99% 100% 100%  Weight:      Height:        Body mass index is 17.38 kg/m.  Physical Exam   Constitutional: Appears well-developed and well-nourished.  Psych: Affect appropriate to situation.  Eyes: No scleral injection.  HENT: No OP obstruction.  Head: Normocephalic.  Cardiovascular: Normal rate and regular rhythm.  Respiratory: Effort normal, non-labored breathing.  GI: Soft.  No distension. There is no tenderness.  Skin: Macular nonblanching rash to feet bilaterally. Old hyperpigmented lesions  scattered to the skin of her limbs and right neck. Callused hyperpigmented lesion to the palm of her left hand.   Neurologic Examination   Neuro: Mental Status: Patient is awake, alert, oriented to person, place, month, year, and situation. Patient is able to give a clear and coherent history. No signs of aphasia or neglect Cranial Nerves: II: Visual Fields are full. Pupils are equal, round, and reactive to light.   III,IV, VI: EOMI without ptosis or diplopia.  V: Facial sensation is symmetric to temperature VII: Facial movement is symmetric.  VIII: Hearing is intact to voice X: No hoarseness or hypophonia XI: Shoulder shrug is symmetric. XII: Tongue is midline without atrophy or fasciculations.  Motor: BUE: Tone and bulk are normal with 5/5 strength proximally and distally BLE: 3/5 BLE proximal. 4-/5 knee flexion, 4/5 plantar flexion, 4-/5 dorsiflexion Sensory: Sensation is reduced to  BLE circumferentially in a non-dermatomal distribution, no difference between the left and the right legs.  Sensation reduced from the mid-abdomen area down to the feet.  Reflexes: 2+ bilateral brachioradialis and biceps. 3+ bilateral patellae.  Cerebellar: FNF intact bilaterally. Unable to do HKS d/t weakness.  Gait: Deferred due to weakness   Labs/Imaging/Neurodiagnostic studies   CBC:  Recent Labs  Lab 10/05/23 0539 09/09/23 0201  WBC 3.6* 5.9  NEUTROABS 2.1 3.6  HGB 12.6 11.1*  HCT 38.1 33.5*  MCV 86.2 85.2  PLT 258 290   Basic Metabolic Panel:  Lab Results  Component Value Date   NA 135 09/09/2023   K 2.9 (L) 09/09/2023   CO2 22 09/09/2023   GLUCOSE 106 (H) 09/09/2023   BUN 12 09/09/2023   CREATININE 0.55 09/09/2023   CALCIUM 9.7 09/09/2023   GFRNONAA >60 09/09/2023   GFRAA >90 12/29/2013   Lipid Panel: No results found for: "LDLCALC" HgbA1c: No results found for: "HGBA1C" Urine Drug Screen:     Component Value Date/Time   LABOPIA NONE DETECTED 04/11/2013 2054    COCAINSCRNUR NONE DETECTED 04/11/2013 2054   LABBENZ NONE DETECTED 04/11/2013 2054   AMPHETMU NONE DETECTED 04/11/2013 2054   THCU NONE DETECTED 04/11/2013 2054   LABBARB NONE DETECTED 04/11/2013 2054    Alcohol Level     Component Value Date/Time   ETH <11 04/11/2013 2047   INR  Lab Results  Component Value Date   INR 1.0 08/08/2023   APTT No results found for: "APTT" AED levels: No results found for: "PHENYTOIN", "ZONISAMIDE", "LAMOTRIGINE", "LEVETIRACETA"  CT Head without contrast (Personally reviewed): Normal.   MRI C/T/L Spine with contrast (Personally reviewed): Longitudinally extensive spinal cord lesion spanning T1-T9, history of lupus suggesting autoimmune cause. No visible enhancement on images that are degraded by movement artifact.   ASSESSMENT  MARANGELY SANANDRES is a 32 y.o. female with past medical history significant for lupus, vitamin B6 deficiency, genital herpes, gonorrhea, substance syndrome who presented 12/11 to the [facility], ED complaining of progressive bilateral leg weakness, numbness and pain x 5 days, with a sensory level at her lower thorax. MRI revealed a longitudinally extensive acute nonenhancing spinal cord lesion extending from the T1 to the T9 level.   - Exam reveals BLE weakness with sensory loss. Patellar reflexes are pathologically brisk. Also noted on exam are old hyperpigmented scattered skin lesions to her right neck and limbs, including one on the palm of her left hand. She also has a nonblanching red macular rash to the skin of her feet bilaterally.  - CSF results from today's LP: - Clear and colorless - Glucose 40 (no serum glucose obtained at time of LP but prior glucoses have been 64, 89, 76 and 106, so this somewhat low CSF glucose may be normal (0.6 of serum value is norma) - WBC 2 - Protein mildly elevated at 52 - OCBs and IgG index are pending - VDRL pending - DDx: - Her presentation is most concerning for Lupus-related autoimmune  transverse myelitis.  - Syphilitic myelitis, although rare, is also a possibility as patient has history of STIs and history of joint pain as well as a rash on her feet. Additionally, after receiving doxycycline she did have a rash as a reaction, which could be due to the doxycycline itself or a Jarisch-Herxheimer reaction. However, there is no classic "candle-guttering" on the post contrast sagittal images.  - A viral etiology for her transverse myelitis is also on the DDx -  Anti-MOG antibody syndrome (aka myelin oligodendrocyte glycoprotein antibody-associated disease or MOGAD) and neuromyelitis optica should also be considered   RECOMMENDATIONS  - We will follow pending LP lab results - Serum RPR and treponemal Ab have been ordered. CSF VDRL is pending.  - CSF HSV, CMV and VZV PCR have been ordered - Ordering serum anti-MOG and anti-NMO antibodies - Solu-medrol 1,000mg  X 5 days. Patient has already rcvd one dose in ED today.  - Protonix for GI prophylaxis while on steroids. Check CBC and BMP daily. Also closely monitor CBG.  - May need to consult Dermatology to assess the acute rash on her feet, which started one day after starting oral prednisone.   _______________________________________________________________   Pt seen by Neuro NP/APP and by MD.  Lynnae January, DNP, AGACNP-BC [facility] Please use AMION for contact information & EPIC for messaging.  I have seen and examined the patient. I have formulated the assessment and recommendations. 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 Exam reveals a sensory level at approximately T6 bilaterally, extending caudally to her feet, and BLE UMN weakness with spasticity; she has normal upper extremity strength and sensation. No CN abnormalities appreciated. Recommendations as above.  Electronically signed: Dr. Caryl Pina

## 2023-09-09 NOTE — ED Provider Notes (Signed)
[place] EMERGENCY DEPARTMENT AT [facility] Provider Note  CSN: [id number] Arrival date & time: 09/08/23 2359  Chief Complaint(s) Leg Pain  HPI Stacy Kelley is a 32 y.o. female with h/o SLE seen here with progression of myalgia and paresthesia. Patient was seen by PCP and Rx prednisone which she had not started when she presented to the ER on 12/8. Since she has been taking her prednisone.  Patient has developed worsening myalgia and now numbness from lower torso to lower extremities. She has now also developed incontinence. She also reports bilateral foot rash that she developed after starting the prednisone.  The history is provided by the patient.    Past Medical History Past Medical History:  Diagnosis Date   Genital herpes    Gonorrhea    SLE (systemic lupus erythematosus) (HCC)    Patient Active Problem List   Diagnosis Date Noted   Generalized rash 11/23/2015   NSVD (normal spontaneous vaginal delivery) 05/11/2015   Home Medication(s) Prior to Admission medications   Medication Sig Start Date End Date Taking? Authorizing Provider  carboxymethylcellulose (REFRESH PLUS) 0.5 % SOLN Place 1 drop into both eyes 3 (three) times daily as needed (dry eyes). 05/12/22   [provider]  docusate sodium (COLACE) 100 MG capsule Take 100 mg by mouth daily. 01/15/22   [provider]  ferrous gluconate (FERGON) 324 MG tablet Take 324 mg by mouth daily. 07/24/22   [provider]  hydrocortisone (ANUSOL-HC) 2.5 % rectal cream Place 1 application. rectally 2 (two) times daily. 01/29/22   [provider]  hydrocortisone cream 1 % Apply to affected area 2 times daily Patient taking differently: Apply 1 Application topically 2 (two) times daily. 08/08/23   Judithann Sheen, PA  hydroxychloroquine (PLAQUENIL) 200 MG tablet Take 200 mg by mouth daily. 07/08/22   [provider]  ibuprofen (ADVIL) 800 MG tablet Take 800 mg by mouth every 8  (eight) hours as needed.    [provider]  medroxyPROGESTERone (DEPO-PROVERA) 150 MG/ML injection Inject 150 mg into the muscle every 3 (three) months. 03/12/20   [provider]  ondansetron (ZOFRAN-ODT) 4 MG disintegrating tablet Take 1 tablet (4 mg total) by mouth every 8 (eight) hours as needed. 09/06/23   Long, Arlyss Repress, MD  potassium chloride SA (KLOR-CON M) 20 MEQ tablet Take 2 tablets (40 mEq total) by mouth daily for 3 days. 09/06/23 09/09/23  Long, Arlyss Repress, MD  predniSONE (DELTASONE) 20 MG tablet Take 20 mg by mouth daily. 09/04/23   [provider]  triamcinolone cream (KENALOG) 0.1 % Apply 1 application. topically 2 (two) times daily. 01/09/22   [provider]  diphenhydrAMINE (BENADRYL) 25 MG tablet Take 1 tablet (25 mg total) by mouth every 6 (six) hours. Patient not taking: Reported on 05/30/2018 04/08/16 11/11/19  Bethel Born, PA-C  etonogestrel-ethinyl estradiol (NUVARING) 0.12-0.015 MG/24HR vaginal ring Insert vaginally and leave in place for 3 consecutive weeks, then remove for 1 week. Patient not taking: Reported on 04/08/2016 11/22/15 11/11/19  Roe Coombs, CNM  Allergies Augmentin [amoxicillin-pot clavulanate]  Review of Systems Review of Systems As noted in HPI  Physical Exam Vital Signs  I have reviewed the triage vital signs BP 125/87   Pulse 97   Temp 98.6 F (37 C) (Oral)   Resp 18   Ht 5\' 2"  (1.575 m)   Wt 43.1 kg   LMP 11/14/2022 (Approximate) Comment: negative HCG 09/06/23  SpO2 99%   BMI 17.38 kg/m   Physical Exam Vitals reviewed.  Constitutional:      General: She is not in acute distress.    Appearance: She is well-developed. She is not diaphoretic.  HENT:     Head: Normocephalic and atraumatic.     Nose: Nose normal.  Eyes:     General: No scleral icterus.       Right eye:  No discharge.        Left eye: No discharge.     Conjunctiva/sclera: Conjunctivae normal.     Pupils: Pupils are equal, round, and reactive to light.  Cardiovascular:     Rate and Rhythm: Normal rate and regular rhythm.     Heart sounds: No murmur heard.    No friction rub. No gallop.  Pulmonary:     Effort: Pulmonary effort is normal. No respiratory distress.     Breath sounds: Normal breath sounds. No stridor. No rales.  Abdominal:     General: There is no distension.     Palpations: Abdomen is soft.     Tenderness: There is no abdominal tenderness.  Musculoskeletal:        General: No tenderness.     Cervical back: Normal range of motion and neck supple.  Skin:    General: Skin is warm and dry.     Findings: Rash present. No erythema. Rash is purpuric (to bilateral feet).  Neurological:     Mental Status: She is alert and oriented to person, place, and time.     Cranial Nerves: Cranial nerves 2-12 are intact.     Comments: Decreased light touch and pinprick sensation from mid torso down to BLE. 5/5 BUE strength. 4/5 BLE strength. 3+ patellar reflex. No clonus No dysmetria      ED Results and Treatments Labs (all labs ordered are listed, but only abnormal results are displayed) Labs Reviewed  CBC WITH DIFFERENTIAL/PLATELET - Abnormal; Notable for the following components:      Result Value   Hemoglobin 11.1 (*)    HCT 33.5 (*)    All other components within normal limits  COMPREHENSIVE METABOLIC PANEL - Abnormal; Notable for the following components:   Potassium 2.9 (*)    Glucose, Bld 106 (*)    Total Protein 8.9 (*)    AST 14 (*)    All other components within normal limits  LIPASE, BLOOD  URINALYSIS, W/ REFLEX TO CULTURE (INFECTION SUSPECTED)  CSF CELL COUNT WITH DIFFERENTIAL  GLUCOSE, CSF  PROTEIN, CSF  IGG CSF INDEX  DRAW EXTRA CLOT TUBE  OLIGOCLONAL BANDS, CSF + SERM  DRAW EXTRA CLOT TUBE  EKG  EKG Interpretation Date/Time:    Ventricular Rate:    PR Interval:    QRS Duration:    QT Interval:    QTC Calculation:   R Axis:      Text Interpretation:         Radiology CT ABDOMEN PELVIS W CONTRAST  Result Date: 09/09/2023 CLINICAL DATA:  Acute, nonlocalized abdominal pain. EXAM: CT ABDOMEN AND PELVIS WITH CONTRAST TECHNIQUE: Multidetector CT imaging of the abdomen and pelvis was performed using the standard protocol following bolus administration of intravenous contrast. RADIATION DOSE REDUCTION: This exam was performed according to the departmental dose-optimization program which includes automated exposure control, adjustment of the mA and/or kV according to patient size and/or use of iterative reconstruction technique. CONTRAST:  80mL OMNIPAQUE IOHEXOL 300 MG/ML  SOLN COMPARISON:  None Available. FINDINGS: Lower chest:  No contributory findings. Hepatobiliary: No focal liver abnormality.No evidence of biliary obstruction or stone. Pancreas: Unremarkable.  Suspect pancreas divisum. Spleen: Unremarkable. Adrenals/Urinary Tract: Negative adrenals. No hydronephrosis or stone. Unremarkable bladder. Stomach/Bowel:  No obstruction. No appendicitis. Vascular/Lymphatic: No acute vascular abnormality. No mass or adenopathy. Reproductive:No pathologic findings. Other: No ascites or pneumoperitoneum. Musculoskeletal: No acute abnormalities. IMPRESSION: Negative abdominal CT. Electronically Signed   By: Tiburcio Pea M.D.   On: 09/09/2023 04:58   MR Cervical Spine W and Wo Contrast  Result Date: 09/09/2023 CLINICAL DATA:  Demyelinating disease. Increased pain for 2 weeks. Numbness from abdomen to feet with pain. EXAM: MRI CERVICAL, THORACIC AND LUMBAR SPINE WITHOUT AND WITH CONTRAST TECHNIQUE: Multiplanar and multiecho pulse sequences of the cervical spine, to include the craniocervical junction and cervicothoracic junction, and  thoracic and lumbar spine, were obtained without and with intravenous contrast. CONTRAST:  4mL GADAVIST GADOBUTROL 1 MMOL/ML IV SOLN COMPARISON:  None Available. FINDINGS: MRI CERVICAL SPINE FINDINGS Alignment: Physiologic. Vertebrae: No fracture, evidence of discitis, or bone lesion. Cord: Normal in the cervical region Posterior Fossa, vertebral arteries, paraspinal tissues: No perispinal mass or inflammation. Disc levels: No degeneration or impingement. MRI THORACIC SPINE FINDINGS Alignment:  Normal Vertebrae: No fracture, evidence of discitis, or aggressive bone lesion. No bony infarct detected. T8 hemangioma. Cord: T2 hyperintensity in the cord spanning T1-T9, involving the majority of the cord centered at the midportion. No abnormal flow voids. Mild if any so swelling and no detectable enhancement (very motion degraded). The lack of swelling and enhancement argues against tumor. Spinal cord infarct is considered given the gray matter involvement and location, but more subacute symptom onset with no provided history of trauma and a positive history of lupus. Paraspinal and other soft tissues: Negative. Normal flow void in the aorta. Disc levels: No degenerative changes or impingement MRI LUMBAR SPINE FINDINGS Segmentation:  Standard. Alignment:  Physiologic. Vertebrae:  No fracture, evidence of discitis, or bone lesion. Conus medullaris and cauda equina: Conus extends to the L2 level. Conus and cauda equina appear normal. Paraspinal and other soft tissues: No perispinal mass or inflammation. 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 Electronically Signed   By: Tiburcio Pea M.D.   On: 09/09/2023 04:26   MR THORACIC SPINE W WO CONTRAST  Result Date: 09/09/2023 CLINICAL DATA:  Demyelinating disease. Increased pain for 2 weeks. Numbness from abdomen to  feet with pain. EXAM: MRI CERVICAL, THORACIC AND LUMBAR SPINE WITHOUT AND WITH CONTRAST TECHNIQUE: Multiplanar and multiecho pulse sequences of the cervical spine, to include the craniocervical  junction and cervicothoracic junction, and thoracic and lumbar spine, were obtained without and with intravenous contrast. CONTRAST:  4mL GADAVIST GADOBUTROL 1 MMOL/ML IV SOLN COMPARISON:  None Available. FINDINGS: MRI CERVICAL SPINE FINDINGS Alignment: Physiologic. Vertebrae: No fracture, evidence of discitis, or bone lesion. Cord: Normal in the cervical region Posterior Fossa, vertebral arteries, paraspinal tissues: No perispinal mass or inflammation. Disc levels: No degeneration or impingement. MRI THORACIC SPINE FINDINGS Alignment:  Normal Vertebrae: No fracture, evidence of discitis, or aggressive bone lesion. No bony infarct detected. T8 hemangioma. Cord: T2 hyperintensity in the cord spanning T1-T9, involving the majority of the cord centered at the midportion. No abnormal flow voids. Mild if any so swelling and no detectable enhancement (very motion degraded). The lack of swelling and enhancement argues against tumor. Spinal cord infarct is considered given the gray matter involvement and location, but more subacute symptom onset with no provided history of trauma and a positive history of lupus. Paraspinal and other soft tissues: Negative. Normal flow void in the aorta. Disc levels: No degenerative changes or impingement MRI LUMBAR SPINE FINDINGS Segmentation:  Standard. Alignment:  Physiologic. Vertebrae:  No fracture, evidence of discitis, or bone lesion. Conus medullaris and cauda equina: Conus extends to the L2 level. Conus and cauda equina appear normal. Paraspinal and other soft tissues: No perispinal mass or inflammation. 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 Electronically Signed   By: Tiburcio Pea M.D.   On: 09/09/2023 04:26   MR Lumbar Spine W Wo Contrast  Result Date: 09/09/2023 CLINICAL DATA:  Demyelinating disease. Increased pain for 2 weeks. Numbness from abdomen to feet with pain. EXAM: MRI CERVICAL, THORACIC AND LUMBAR SPINE WITHOUT AND WITH CONTRAST TECHNIQUE: Multiplanar and multiecho pulse sequences of the cervical spine, to include the craniocervical junction and cervicothoracic junction, and thoracic and lumbar spine, were obtained without and with intravenous contrast. CONTRAST:  4mL GADAVIST GADOBUTROL 1 MMOL/ML IV SOLN COMPARISON:  None Available. FINDINGS: MRI CERVICAL SPINE FINDINGS Alignment: Physiologic. Vertebrae: No fracture, evidence of discitis, or bone lesion. Cord: Normal in the cervical region Posterior Fossa, vertebral arteries, paraspinal tissues: No perispinal mass or inflammation. Disc levels: No degeneration or impingement. MRI THORACIC SPINE FINDINGS Alignment:  Normal Vertebrae: No fracture, evidence of discitis, or aggressive bone lesion. No bony infarct detected. T8 hemangioma. Cord: T2 hyperintensity in the cord spanning T1-T9, involving the majority of the cord centered at the midportion. No abnormal flow voids. Mild if any so swelling and no detectable enhancement (very motion degraded). The lack of swelling and enhancement argues against tumor. Spinal cord infarct is considered given the gray matter involvement and location, but more subacute symptom onset with no provided history of trauma and a positive history of lupus. Paraspinal and other soft tissues: Negative. Normal flow void in the aorta. Disc levels: No degenerative changes or impingement MRI LUMBAR SPINE FINDINGS Segmentation:  Standard. Alignment:  Physiologic. Vertebrae:  No fracture, evidence of discitis, or bone lesion. Conus medullaris and cauda equina: Conus extends to the L2 level. Conus and cauda equina appear normal. Paraspinal and other soft  tissues: No perispinal mass or inflammation. 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 Electronically Signed   By: Tiburcio Pea M.D.   On: 09/09/2023 04:26    Medications Ordered in ED Medications  HYDROmorphone (  DILAUDID) injection 0.5 mg (0.5 mg Intravenous Given 09/09/23 0418)  methylPREDNISolone sodium succinate (SOLU-MEDROL) 1,000 mg in sodium chloride 0.9 % 50 mL IVPB (has no administration in time range)  sodium chloride 0.9 % bolus 1,000 mL (1,000 mLs Intravenous New Bag/Given 09/09/23 0443)  LORazepam (ATIVAN) injection 1 mg (1 mg Intravenous Given 09/09/23 0226)  gadobutrol (GADAVIST) 1 MMOL/ML injection 4 mL (4 mLs Intravenous Contrast Given 09/09/23 0335)  iohexol (OMNIPAQUE) 300 MG/ML solution 80 mL (80 mLs Intravenous Contrast Given 09/09/23 0420)   Procedures Lumbar Puncture  Date/Time: 09/09/2023 5:41 AM  Performed by: Nira Conn, MD Authorized by: Nira Conn, MD   Consent:    Consent obtained:  Written   Consent given by:  Patient   Risks discussed:  Bleeding, infection, headache and nerve damage   Alternatives discussed:  Delayed treatment Universal protocol:    Imaging studies available: yes     Immediately prior to procedure a time out was called: yes     Site/side marked: yes     Patient identity confirmed:  Verbally with patient and arm band Pre-procedure details:    Procedure purpose:  Diagnostic   Preparation: Patient was prepped and draped in usual sterile fashion   Anesthesia:    Anesthesia method:  Local infiltration   Local anesthetic:  Lidocaine 1% w/o epi Procedure details:    Lumbar space:  L4-L5 interspace   Patient position:  L lateral decubitus   Needle gauge:  18   Needle type:  Spinal needle - Quincke tip   Needle length (in):  3.5   Ultrasound guidance: no      Number of attempts:  1   Fluid appearance:  Clear   Tubes of fluid:  4   Total volume (ml):  8 Post-procedure details:    Puncture site:  Adhesive bandage applied and direct pressure applied   Procedure completion:  Tolerated   (including critical care time) Medical Decision Making / ED Course   Medical Decision Making Amount and/or Complexity of Data Reviewed Labs: ordered. Decision-making details documented in ED Course. Radiology: ordered and independent interpretation performed. Decision-making details documented in ED Course.  Risk Prescription drug management. Decision regarding hospitalization.    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 Feel less concerned for GBS.  CBC without leukocytosis.  Mild anemia. CMP with mild hypokalemia at 2.9.  No other electrolyte derangements or renal sufficiency. MRI of the cervical, thoracic and lumbar spine notable for spinal cord inflammation from T1-T9. LP performed for CSF studies.  Consulted with Dr. Otelia Limes from neurology regarding appropriate CSF labs. They provided recommendations. They will help manage care during admission. 1g Solumedrol given.  CT of A/P obtained due to abd TTP and negative for acute process.  Consulted Hospitalist service and spoke with Dr. Julian Reil for admission to [facility].      Final Clinical Impression(s) / ED Diagnoses Final diagnoses:  Spinal cord inflammation (HCC)    This chart was dictated using voice recognition software.  Despite best efforts to proofread,  errors can occur which can change the documentation meaning.    Nira Conn, MD 09/09/23 [phone]

## 2023-09-09 NOTE — ED Notes (Signed)
Perwick removed per pt request, pt unable to void on periwick.  Pt able to stand and pivot to bedside toilet with assistance.

## 2023-09-09 NOTE — ED Notes (Signed)
Patient transported to MRI 

## 2023-09-09 NOTE — H&P (Signed)
History and Physical    Patient: Stacy Kelley ZOX:[id number] DOB: 1990-12-02 DOA: 09/09/2023 DOS: the patient was seen and examined on 09/09/2023 PCP: [facility]  Patient coming from: Home  Chief Complaint:  Chief Complaint  Patient presents with   Leg Pain   HPI: Stacy Kelley is a 32 y.o. female with medical history significant of genital herpes, gonorrhea's, SLE, Sjogren syndrome, hyperpigmentation, thyromegaly, vitamin D deficiency, iron deficiency anemia who presented to the emergency department with complaints of sudden onset of bilateral lower extremity numbness and pain for the past 8 days.  No fecal or urinary incontinence.  She has been constipated.  She denied fever, chills, rhinorrhea, sore throat, wheezing or hemoptysis.  No chest pain, palpitations, diaphoresis, PND, orthopnea or pitting edema of the lower extremities.  No abdominal pain, nausea, emesis, diarrhea, melena or hematochezia.  No flank pain, dysuria, frequency or hematuria.  No polyuria, polydipsia, polyphagia or blurred vision.   Lab work: Urinalysis shows specific gravity greater than 1.046 and ketones of 5 mg/dL.  CBC showed a white count of 5.9, hemoglobin 11.1 g/dL platelets 811.  CMP showed a potassium of 2.9 mmol/L, glucose 76 mg/dL, total protein 8.9 g/dL and AST 14 units/L.  The rest of the CMP measurements were normal.  Lipase, magnesium and phosphorus were normal.  CSF and glucose was 40 and total protein 52 mg/dL.  Imaging: MR cervical/thoracic/lumbar spine showing longitudinally extensive spinal cord lesion spanning T1-T9, history of lupus suggesting autoimmune disease.  No fracture, evidence of discitis or aggressive bone lesion.  No Brocious infarct.  There is a T8 hemangioma.  CT abdomen/pelvis with contrast was negative.   ED course: Initial vital signs were temperature 98.6 F, pulse 95, respiration 18, BP 122/85 mmHg O2 sat 100% on room air.  The patient received  lorazepam 1 mg IVP prior to MRI imaging, until prednisolone 1000 mg IVPB and 1000 mL normal saline bolus.  Review of Systems: As mentioned in the history of present illness. All other systems reviewed and are negative. Past Medical History:  Diagnosis Date   Genital herpes    Gonorrhea    SLE (systemic lupus erythematosus) (HCC)    Past Surgical History:  Procedure Laterality Date   CESAREAN SECTION     Social History:  reports that she has never smoked. She has never used smokeless tobacco. She reports that she does not drink alcohol and does not use drugs.  Allergies  Allergen Reactions   Augmentin [Amoxicillin-Pot Clavulanate] Swelling    Swelling     Family History  Problem Relation Age of Onset   Diabetes Maternal Grandmother    Diabetes Maternal Grandfather    Diabetes Paternal Grandmother    Hypertension Paternal Grandfather    Diabetes Paternal Grandfather     Prior to Admission medications   Medication Sig Start Date End Date Taking? Authorizing Provider  carboxymethylcellulose (REFRESH PLUS) 0.5 % SOLN Place 1 drop into both eyes 3 (three) times daily as needed (dry eyes). 05/12/22   [provider]  docusate sodium (COLACE) 100 MG capsule Take 100 mg by mouth daily. 01/15/22   [provider]  ferrous gluconate (FERGON) 324 MG tablet Take 324 mg by mouth daily. 07/24/22   [provider]  hydrocortisone (ANUSOL-HC) 2.5 % rectal cream Place 1 application. rectally 2 (two) times daily. 01/29/22   [provider]  hydrocortisone cream 1 % Apply to affected area 2 times daily Patient taking differently: Apply 1  Application topically 2 (two) times daily. 08/08/23   Judithann Sheen, PA  hydroxychloroquine (PLAQUENIL) 200 MG tablet Take 200 mg by mouth daily. 07/08/22   [provider]  ibuprofen (ADVIL) 800 MG tablet Take 800 mg by mouth every 8 (eight) hours as needed.    [provider]  medroxyPROGESTERone (DEPO-PROVERA)  150 MG/ML injection Inject 150 mg into the muscle every 3 (three) months. 03/12/20   [provider]  ondansetron (ZOFRAN-ODT) 4 MG disintegrating tablet Take 1 tablet (4 mg total) by mouth every 8 (eight) hours as needed. 09/06/23   Long, Arlyss Repress, MD  potassium chloride SA (KLOR-CON M) 20 MEQ tablet Take 2 tablets (40 mEq total) by mouth daily for 3 days. 09/06/23 09/09/23  Long, Arlyss Repress, MD  predniSONE (DELTASONE) 20 MG tablet Take 20 mg by mouth daily. 09/04/23   [provider]  triamcinolone cream (KENALOG) 0.1 % Apply 1 application. topically 2 (two) times daily. 01/09/22   [provider]  diphenhydrAMINE (BENADRYL) 25 MG tablet Take 1 tablet (25 mg total) by mouth every 6 (six) hours. Patient not taking: Reported on 05/30/2018 04/08/16 11/11/19  Bethel Born, PA-C  etonogestrel-ethinyl estradiol (NUVARING) 0.12-0.015 MG/24HR vaginal ring Insert vaginally and leave in place for 3 consecutive weeks, then remove for 1 week. Patient not taking: Reported on 04/08/2016 11/22/15 11/11/19  Roe Coombs, CNM    Physical Exam: Vitals:   09/09/23 0215 09/09/23 0230 09/09/23 0415 09/09/23 0647  BP: 128/87  125/87   Pulse: 81 90 97   Resp:   18   Temp:   98.6 F (37 C) 98.8 F (37.1 C)  TempSrc:   Oral Oral  SpO2: 100% 100% 99%   Weight:      Height:       Physical Exam Vitals and nursing note reviewed.  Constitutional:      General: She is awake. She is not in acute distress.    Appearance: Normal appearance. She is underweight. She is ill-appearing.  HENT:     Head: Normocephalic.     Nose: No rhinorrhea.     Mouth/Throat:     Mouth: Mucous membranes are moist.  Eyes:     General: No scleral icterus.    Pupils: Pupils are equal, round, and reactive to light.  Neck:     Vascular: No JVD.  Cardiovascular:     Rate and Rhythm: Normal rate and regular rhythm.     Heart sounds: S1 normal and S2 normal.  Pulmonary:     Effort: Pulmonary effort is  normal.  Abdominal:     General: Bowel sounds are normal.     Palpations: Abdomen is soft.  Musculoskeletal:     Cervical back: Neck supple.     Right lower leg: No edema.     Left lower leg: No edema.  Skin:    General: Skin is warm and dry.  Neurological:     Mental Status: She is alert and oriented to person, place, and time.     Cranial Nerves: Cranial nerves 2-12 are intact.     Sensory: Sensory deficit present.     Motor: Weakness present.     Coordination: Romberg sign negative.     Comments: Decreased sensation of lower extremities bilaterally. Decrease of 4/5 in strength of LE bilaterally.  Psychiatric:        Mood and Affect: Mood normal.        Behavior: Behavior normal. Behavior is cooperative.  Data Reviewed:  Results are pending, will review when available.  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 IP/PCU. Frequent neurochecks. Consult PT and OT. Follow-up CSF labs. Continue Solu-Medrol per neurohospitalist team. Further workup per neurohospitalist team. Risk factors modifications. Stroke team input appreciated.  Active Problems:   Hypokalemia Replacing orally. Magnesium was supplemented. Follow-up potassium level.    Hyperproteinemia In the setting of autoimmune disease.    Iron deficiency anemia Monitor hematocrit and hemoglobin. Transfuse as needed.    Systemic lupus erythematosus (HCC)   Sjogren's syndrome (HCC) Currently off disease modifying agents. Follow-up with rheumatology as an outpatient.    Advance Care Planning:   Code Status: Full Code   Consults: Neurohospitalist team.  Family Communication:   Severity of Illness: The appropriate patient status for this patient is INPATIENT. Inpatient status is judged to be reasonable and necessary in order to provide the required intensity of service to ensure the patient's safety. The patient's presenting symptoms, physical exam  findings, and initial radiographic and laboratory data in the context of their chronic comorbidities is felt to place them at high risk for further clinical deterioration. Furthermore, it is not anticipated that the patient will be medically stable for discharge from the hospital within 2 midnights of admission.   * I certify that at the point of admission it is my clinical judgment that the patient will require inpatient hospital care spanning beyond 2 midnights from the point of admission due to high intensity of service, high risk for further deterioration and high frequency of surveillance required.*  Author: Bobette Mo, MD 09/09/2023 7:47 AM  For on call review [email or web address].   This document was prepared using Dragon voice recognition software and may contain some unintended transcription errors.

## 2023-09-09 NOTE — ED Notes (Signed)
Pt placed on bedside, sheets and chucks changed, pt voided approximately 

## 2023-09-09 NOTE — ED Notes (Signed)
Pt allowed to sit at 70 degree angle to promote urination.  Pt denies headache at this time

## 2023-09-09 NOTE — Progress Notes (Signed)
Pt arrived with carelink [id number].  No report was given prior to arrival.  NAD.  Transferred to bed.  Pt asked that she be assigned a female nurse instead of a female.  Pt's request accommodated.

## 2023-09-09 NOTE — ED Notes (Signed)
Patient transported to CT 

## 2023-09-09 NOTE — ED Triage Notes (Signed)
Pt states that she has had increased pain x 2 weeks that she relates to her lupus. Pt was seen here for same and prescribed prednisone, but has seen no improvement. Pt states that from her abdomen down to her feet has numbness and pain.

## 2023-09-09 NOTE — Progress Notes (Signed)
Attempted call back, ED nurse busy and unable to answer, will try again

## 2023-09-10 ENCOUNTER — Inpatient Hospital Stay (HOSPITAL_COMMUNITY): Payer: Medicaid Other

## 2023-09-10 DIAGNOSIS — G373 Acute transverse myelitis in demyelinating disease of central nervous system: Secondary | ICD-10-CM | POA: Diagnosis not present

## 2023-09-10 LAB — MENINGITIS/ENCEPHALITIS PANEL (CSF)

## 2023-09-10 LAB — COMPREHENSIVE METABOLIC PANEL
ALT: 11 U/L (ref 0–44)
AST: 12 U/L — ABNORMAL LOW (ref 15–41)
Albumin: 3.9 g/dL (ref 3.5–5.0)
Alkaline Phosphatase: 50 U/L (ref 38–126)
Anion gap: 8 (ref 5–15)
BUN: 7 mg/dL (ref 6–20)
CO2: 22 mmol/L (ref 22–32)
Calcium: 9.9 mg/dL (ref 8.9–10.3)
Chloride: 105 mmol/L (ref 98–111)
Creatinine, Ser: 0.53 mg/dL (ref 0.44–1.00)
GFR, Estimated: >60 mL/min (ref >60)
Glucose, Bld: 111 mg/dL — ABNORMAL HIGH (ref 70–99)
Potassium: 4.1 mmol/L (ref 3.5–5.1)
Sodium: 135 mmol/L (ref 135–145)
Total Bilirubin: 0.5 mg/dL (ref <1.2)
Total Protein: 8.8 g/dL — ABNORMAL HIGH (ref 6.5–8.1)

## 2023-09-10 LAB — IGG CSF INDEX
Albumin CSF-mCnc: 23 mg/dL (ref 7–29)
Albumin: 3.7 g/dL — ABNORMAL LOW (ref 3.9–4.9)
CSF IgG Index: 0.9 — ABNORMAL HIGH (ref 0.0–0.7)
IgG (Immunoglobin G), Serum: 2050 mg/dL — ABNORMAL HIGH (ref 586–1602)
IgG, CSF: 11.3 mg/dL — ABNORMAL HIGH (ref 0.0–6.7)
IgG/Alb Ratio, CSF: 0.49 — ABNORMAL HIGH (ref 0.00–0.25)

## 2023-09-10 LAB — CBC
HCT: 36.7 % (ref 36.0–46.0)
Hemoglobin: 11.9 g/dL — ABNORMAL LOW (ref 12.0–15.0)
MCH: 27.5 pg (ref 26.0–34.0)
MCHC: 32.4 g/dL (ref 30.0–36.0)
MCV: 85 fL (ref 80.0–100.0)
Platelets: 348 10*3/uL (ref 150–400)
RBC: 4.32 MIL/uL (ref 3.87–5.11)
RDW: 14.1 % (ref 11.5–15.5)
WBC: 8.1 10*3/uL (ref 4.0–10.5)
nRBC: 0 % (ref 0.0–0.2)

## 2023-09-10 LAB — SEDIMENTATION RATE: Sed Rate: 85 mm/h — ABNORMAL HIGH (ref 0–22)

## 2023-09-10 LAB — RPR: RPR Ser Ql: NONREACTIVE

## 2023-09-10 LAB — HIV ANTIBODY (ROUTINE TESTING W REFLEX): HIV Screen 4th Generation wRfx: NONREACTIVE

## 2023-09-10 LAB — VITAMIN D 25 HYDROXY (VIT D DEFICIENCY, FRACTURES): Vit D, 25-Hydroxy: 10.32 ng/mL — ABNORMAL LOW (ref 30–100)

## 2023-09-10 MED ORDER — SODIUM CHLORIDE 0.9 % IV SOLN
1000.0000 mg | Freq: Every day | INTRAVENOUS | Status: AC
  Administered 2023-09-10 – 2023-09-13 (×4): 1000 mg via INTRAVENOUS
  Filled 2023-09-10 (×4): qty 16

## 2023-09-10 MED ORDER — LORAZEPAM 2 MG/ML IJ SOLN
1.0000 mg | Freq: Once | INTRAMUSCULAR | Status: AC | PRN
  Administered 2023-09-10: 1 mg via INTRAVENOUS
  Filled 2023-09-10: qty 1

## 2023-09-10 MED ORDER — PANTOPRAZOLE SODIUM 40 MG PO TBEC
40.0000 mg | DELAYED_RELEASE_TABLET | Freq: Every day | ORAL | Status: DC
  Administered 2023-09-10 – 2023-09-11 (×2): 40 mg via ORAL
  Filled 2023-09-10 (×2): qty 1

## 2023-09-10 MED ORDER — SENNOSIDES-DOCUSATE SODIUM 8.6-50 MG PO TABS
1.0000 | ORAL_TABLET | Freq: Two times a day (BID) | ORAL | Status: DC
  Administered 2023-09-10 – 2023-09-16 (×9): 1 via ORAL
  Filled 2023-09-10 (×12): qty 1

## 2023-09-10 NOTE — Plan of Care (Signed)
  Problem: Education: Goal: Knowledge of General Education information will improve Description: Including pain rating scale, medication(s)/side effects and non-pharmacologic comfort measures Outcome: Progressing   Problem: Pain Management: Goal: General experience of comfort will improve Outcome: Progressing

## 2023-09-10 NOTE — Progress Notes (Signed)
Pt has a glued in wig which she cannot remove. The wig has metal which will create artifact on the scan of the brain.   10:15pm 09/10/23 cs

## 2023-09-10 NOTE — Plan of Care (Signed)
  Problem: Education: Goal: Knowledge of General Education information will improve Description: Including pain rating scale, medication(s)/side effects and non-pharmacologic comfort measures Outcome: Progressing   Problem: Health Behavior/Discharge Planning: Goal: Ability to manage health-related needs will improve Outcome: Progressing   Problem: Clinical Measurements: Goal: Ability to maintain clinical measurements within normal limits will improve Outcome: Progressing Goal: Will remain free from infection Outcome: Progressing Goal: Diagnostic test results will improve Outcome: Progressing Goal: Respiratory complications will improve Outcome: Progressing Goal: Cardiovascular complication will be avoided Outcome: Progressing   Problem: Activity: Goal: Risk for activity intolerance will decrease Outcome: Progressing   Problem: Nutrition: Goal: Adequate nutrition will be maintained Outcome: Progressing   Problem: Coping: Goal: Level of anxiety will decrease Outcome: Progressing   Problem: Elimination: Goal: Will not experience complications related to bowel motility Outcome: Progressing Goal: Will not experience complications related to urinary retention Outcome: Progressing   Problem: Pain Management: Goal: General experience of comfort will improve Outcome: Progressing   Problem: Safety: Goal: Ability to remain free from injury will improve Outcome: Progressing   Problem: Skin Integrity: Goal: Risk for impaired skin integrity will decrease Outcome: Progressing    Patient still having pain/numbness and weakness of lower extremities.

## 2023-09-10 NOTE — TOC CM/SW Note (Addendum)
Transition of Care [facility]) - Inpatient Brief Assessment   Patient Details  Name: AARTHI MAZZIOTTI MRN: [id number] Date of Birth: [birth year]  Transition of Care [facility]) CM/SW Contact:    Tom-Johnson, Hershal Coria, RN Phone Number: 09/10/2023, 3:18 PM   Clinical Narrative:  Patient presented to the ED with progressing Bilateral LE Numbness, Pain, and Weakness  with Urinary Frequency and Incontinence.  MRI showed a Longitudinally extensive Spinal Cord Lesion T1-T9  . Patient has hx of Lupus, Neurology following.   From home with two children. Mother and 1 sister supportive. Currently employed, independent with care and able to drive self. Does not have DME's at home, requests a rollator, order placed and Dolanda to deliver to patient prior discharge.  PCP is Barbarann Ehlers and uses [employer] on [address].   Patient not Medically ready for discharge.  CM will continue to follow as patient progresses with care towards discharge.          Transition of Care Asessment: Insurance and Status: Insurance coverage has been reviewed Patient has primary care physician: Yes Home environment has been reviewed: Yes Prior level of function:: Independent Prior/Current Home Services: No current home services Social Drivers of Health Review: SDOH reviewed no interventions necessary Readmission risk has been reviewed: Yes Transition of care needs: transition of care needs identified, TOC will continue to follow

## 2023-09-10 NOTE — Progress Notes (Addendum)
NEUROLOGY CONSULT FOLLOW UP NOTE   Date of service: September 10, 2023 Patient Name: Stacy Kelley MRN:  [id number] DOB:  1991/06/28  Brief HPI  Stacy Kelley is a 32 y.o. female with pertinent PMH of SLE (diagnosed on 2023, not on treatment), vitamin B6 deficiency, genital herpes, substance use disorder who presented with sudden onset bilateral LE numbness, pain, and weakness 5 days prior that had continued to progress, now affecting lower torso through LE and with urinary frequency and incontinence. She has been taking prednisone OP (started 12/08) which has not provided any relief. 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 She was given solumedrol 1 g x1 and admitted for further work-up and treatment. Interval Hx/subjective   Subjectively minimal improvement in presenting symptoms. LLE with some sensation though not consistent with a particular dermatome and a general sensation overall.   Vitals   Vitals:   09/10/23 0100 09/10/23 0310 09/10/23 0430 09/10/23 0756  BP: (!) 139/94  118/85   Pulse: 71  64   Resp: 17  17   Temp:  98 F (36.7 C)  98 F (36.7 C)  TempSrc:  Oral  Oral  SpO2: 100%  100%   Weight:      Height:         Body mass index is 17.38 kg/m.  Physical Exam   Constitutional: Appears well-developed and well-nourished.  Psych: Affect appropriate to situation.  Eyes: No scleral injection.  HENT: No OP obstrucion.  Head: Normocephalic.  Cardiovascular: Normal rate and regular rhythm.  Respiratory: Effort normal, non-labored breathing.  GI: Soft.  No distension. There is no tenderness.  Skin: WDI. Bilateral feet with macular rash, L>R. Areas of hyperpigmentation noted over bilateral hands and soles of feet, reportedly chronic per patient.  Neurologic Examination   Mental Status: Patient is awake, alert, oriented x3. No signs of aphasia or neglect. Cranial Nerves: II: Pupils equal, round, and reactive to light.   III,IV,  VI: EOMI without ptosis or diploplia.  V: Facial sensation is symmetric to light touch and temperature. VII: Facial movement is symmetric.  VIII: Hearing is intact to voice X: Uvula elevates symmetrically XI: Shoulder shrug is symmetric. XII: Tongue is midline without atrophy or fasciculations.  Motor: Normal effort thorughout, at least 5/5 bilateral UE, 4/5 bilateral LE. Sensory: Able to process sensation of general touch in bilateral LE but does not feel pain. Random areas in non-dermatomal distribution of general sensation to LLE. Sensation changes begin proximal to umbilicus.   Medications  Current Facility-Administered Medications:    acetaminophen (TYLENOL) tablet 650 mg, 650 mg, Oral, Q6H PRN **OR** acetaminophen (TYLENOL) suppository 650 mg, 650 mg, Rectal, Q6H PRN, Bobette Mo, MD   enoxaparin (LOVENOX) injection 30 mg, 30 mg, Subcutaneous, Q24H, Bobette Mo, MD   methylPREDNISolone sodium succinate (SOLU-MEDROL) 1,000 mg in sodium chloride 0.9 % 50 mL IVPB, 1,000 mg, Intravenous, Daily, Zannie Cove, MD   ondansetron [facility]) tablet 4 mg, 4 mg, Oral, Q6H PRN **OR** ondansetron (ZOFRAN) injection 4 mg, 4 mg, Intravenous, Q6H PRN, Bobette Mo, MD   pantoprazole (PROTONIX) EC tablet 40 mg, 40 mg, Oral, Q1200, Zannie Cove, MD [facility]   CSF studies: RBC: 0 WBC: 2 Differential: Too few to count Glucose 40 Protein 52  Pending studies: IgG CSF index Oligoclonal bands VDRL, CSF VZV PCR, CSF T.pallidum antibody CMV DNA PCR Neuromyelitis optica autoantibody Anti-MOG  RPR negative Meningitis/encephalitis panel negative  CT Head  without contrast: Normal head CT.  MRI cervical, thoracic, lumbar spine w/w/o contrast: Longitudinally extensive spinal cord lesion spanning T1-T9, history of lupus suggesting autoimmune cause.  Assessment   [name] is a 32 y.o. female with past medical history significant for lupus,  vitamin B6 deficiency, genital herpes, gonorrhea, substance syndrome who presented 12/11 to the [facility], ED complaining of progressive bilateral leg weakness, numbness and pain x 5 days, with a sensory level at her lower thorax. MRI revealed a longitudinally extensive acute nonenhancing spinal cord lesion extending from the T1 to the T9 level.    Exam largely unchanged from admission with patient denying significant improvement of symptoms. Several labs still pending from LP. Lupus-related transverse myelitis remains at top of differential, do not suspect syphilitic myelitis given negative RPR. Labs for anti-MOG antibody syndrome and neuromyelitis optica are pending.  Recommendations   -Follow remaining lab results  -ANCA, serum ACE, HTLV 1 + 2, RF, ESR, anti-SSA + anti-SSB added -Will order MRI brain -Continue solumedrol 1,000 mg daily for total 5 days, today is day 2/5 -Protonix for GI prophylaxis, daily CBC and BMP, monitor CBGs -May need to consider PLEX if no improvement after tomorrow (day 3 of solumedrol)  ______________________________________________________________________   Signed, Champ Mungo, DO Internal Medicine PGY-3  I have seen the patient reviewed the above note.  She has a previously established diagnosis of lupus, now with longitudinally extensive transverse myelitis.  This is often seen in conjunction with aquaporin 4 antibodies, but certainly not exclusively.  She is receiving high-dose Solu-Medrol and will continue to assess for response.  Ritta Slot, MD [facility] [phone]  If 7pm- 7am, please page neurology on call as listed in AMION.

## 2023-09-10 NOTE — Progress Notes (Addendum)
PROGRESS NOTE    Stacy Kelley  NFA:[id number] DOB: 1990-12-20 DOA: 09/09/2023 PCP: [facility]  32/F with history of SLE, Sjogren syndrome, with skin rashes and hyperpigmentation, vitamin D deficiency, iron deficiency anemia, STDs presented to the ED 12/11 with bilateral lower extremity numbness and pain X 8 days.  Noted to be hypokalemic, MRI C/T/L-spine with longitudinally extensive spinal cord lesion, hyperintensity suggesting myelitis.  Prior to admission she was treated by PCP with Depo-Medrol shot recently as well as steroids per EDP for?  Lupus flare.  Prior to that treated with antibiotics for axillary abscess -Transferred to [facility], neuro consulting, started on pulse high-dose IV steroids - Subjective: -Admitted, started on IV pulse steroids yesterday, continues to have bilateral lower extremity numbness and weakness, decree sensations from mid thoracic spine  Assessment and Plan:  Acute transverse myelitis (HCC) -Likely autoimmune related to lupus versus viral -Per neurology, day 2 of pulse steroids, add Protonix -CSF largely unremarkable, oligoclonal bands and IgG index pending, VDRL pending -PT OT consult -Also check vitamin D level, ESR    Hypokalemia Replaced     Hyperproteinemia -Likely reactive, in the setting of autoimmune disease.     Iron deficiency anemia -Mild, monitor    Systemic lupus erythematosus (HCC)   Sjogren's syndrome (HCC) -Poorly compliant with Plaquenil -Discussed compliance, close follow-up with rheumatology   Lower extremity/foot skin rash -?  Vasculitis, could be related to lupus versus recent antibiotics etc., monitor for now, would benefit from dermatology follow-up   DVT prophylaxis: SCDs Code Status: Full code Family Communication: None present Disposition Plan: Home pending improvement, steroids  Consultants: Neurology   Procedures:   Antimicrobials:    Objective: Vitals:   09/10/23 0430  09/10/23 0700 09/10/23 0756 09/10/23 0905  BP: 118/85   132/83  Pulse: 64 75  93  Resp: 17 16  16   Temp:   98 F (36.7 C)   TempSrc:   Oral   SpO2: 100% 100%  100%  Weight:      Height:        Intake/Output Summary (Last 24 hours) at 09/10/2023 1039 Last data filed at 09/09/2023 1126 Gross per 24 hour  Intake 41.03 ml  Output --  Net 41.03 ml   Filed Weights   09/09/23 0006  Weight: 43.1 kg    Examination:  General exam: Appears calm and comfortable  Respiratory system: Clear to auscultation Cardiovascular system: S1 & S2 heard, RRR.  Abd: nondistended, soft and nontender.Normal bowel sounds heard. Central nervous system: Bilateral lower extremity numbness and weakness, diminished sensations lower chest down Extremities: no edema Skin: Hyperpigmentation in lower legs, plantar surface of feet, toes erythematous maculopapular lesions on the dorsum of her foot Psychiatry:  Mood & affect appropriate.     Data Reviewed:   CBC: Recent Labs  Lab 09/06/23 0539 09/09/23 0201 09/10/23 0325  WBC 3.6* 5.9 8.1  NEUTROABS 2.1 3.6  --   HGB 12.6 11.1* 11.9*  HCT 38.1 33.5* 36.7  MCV 86.2 85.2 85.0  PLT 258 290 348   Basic Metabolic Panel: Recent Labs  Lab 09/06/23 0539 09/09/23 0201 09/10/23 0325  NA 135 135 135  K 3.1* 2.9* 4.1  CL 102 104 105  CO2 18* 22 22  GLUCOSE 76 106* 111*  BUN 14 12 7   CREATININE 0.57 0.55 0.53  CALCIUM 10.0 9.7 9.9  MG  --  2.3  --   PHOS  --  3.0  --  GFR: Estimated Creatinine Clearance: 69.3 mL/min (by C-G formula based on SCr of 0.53 mg/dL). Liver Function Tests: Recent Labs  Lab 09/06/23 0539 09/09/23 0201 09/10/23 0325  AST 18 14* 12*  ALT 12 10 11   ALKPHOS 59 52 50  BILITOT 1.0 0.5 0.5  PROT 9.4* 8.9* 8.8*  ALBUMIN 4.3 3.9 3.9   Recent Labs  Lab 09/06/23 0539 09/09/23 0201  LIPASE 24 25   No results for input(s): "AMMONIA" in the last 168 hours. Coagulation Profile: No results for input(s): "INR",  "PROTIME" in the last 168 hours. Cardiac Enzymes: No results for input(s): "CKTOTAL", "CKMB", "CKMBINDEX", "TROPONINI" in the last 168 hours. BNP (last 3 results) No results for input(s): "PROBNP" in the last 8760 hours. HbA1C: No results for input(s): "HGBA1C" in the last 72 hours. CBG: No results for input(s): "GLUCAP" in the last 168 hours. Lipid Profile: No results for input(s): "CHOL", "HDL", "LDLCALC", "TRIG", "CHOLHDL", "LDLDIRECT" in the last 72 hours. Thyroid Function Tests: No results for input(s): "TSH", "T4TOTAL", "FREET4", "T3FREE", "THYROIDAB" in the last 72 hours. Anemia Panel: No results for input(s): "VITAMINB12", "FOLATE", "FERRITIN", "TIBC", "IRON", "RETICCTPCT" in the last 72 hours. Urine analysis:    Component Value Date/Time   COLORURINE YELLOW 09/09/2023 0211   APPEARANCEUR CLEAR 09/09/2023 0211   LABSPEC >1.046 (H) 09/09/2023 0211   PHURINE 6.0 09/09/2023 0211   GLUCOSEU NEGATIVE 09/09/2023 0211   HGBUR NEGATIVE 09/09/2023 0211   BILIRUBINUR NEGATIVE 09/09/2023 0211   BILIRUBINUR neg 05/10/2015 1429   KETONESUR 5 (A) 09/09/2023 0211   PROTEINUR NEGATIVE 09/09/2023 0211   UROBILINOGEN 0.2 07/05/2022 1433   NITRITE NEGATIVE 09/09/2023 0211   LEUKOCYTESUR NEGATIVE 09/09/2023 0211   Sepsis Labs: @LABRCNTIP (procalcitonin:4,lacticidven:4)  )No results found for this or any previous visit (from the past 240 hours).   Radiology Studies: CT HEAD WO CONTRAST ( ) Result Date: 09/09/2023 CLINICAL DATA:  Bilateral leg weakness. History of lupus. Progressive myalgia and paresthesia. EXAM: CT HEAD WITHOUT CONTRAST TECHNIQUE: Contiguous axial images were obtained from the base of the skull through the vertex without intravenous contrast. RADIATION DOSE REDUCTION: This exam was performed according to the departmental dose-optimization program which includes automated exposure control, adjustment of the mA and/or kV according to patient size and/or use of iterative  reconstruction technique. COMPARISON:  None Available. FINDINGS: Brain: The brain shows a normal appearance without evidence of malformation, atrophy, old or acute small or large vessel infarction, mass lesion, hemorrhage, hydrocephalus or extra-axial collection. Vascular: No hyperdense vessel. No evidence of atherosclerotic calcification. Skull: Normal.  No traumatic finding.  No focal bone lesion. Sinuses/Orbits: Sinuses are clear. Orbits appear normal. Mastoids are clear. Other: None significant IMPRESSION: Normal head CT. Electronically Signed   By: Paulina Fusi M.D.   On: 09/09/2023 18:18   CT ABDOMEN PELVIS W CONTRAST Result Date: 09/09/2023 CLINICAL DATA:  Acute, nonlocalized abdominal pain. EXAM: CT ABDOMEN AND PELVIS WITH CONTRAST TECHNIQUE: Multidetector CT imaging of the abdomen and pelvis was performed using the standard protocol following bolus administration of intravenous contrast. RADIATION DOSE REDUCTION: This exam was performed according to the departmental dose-optimization program which includes automated exposure control, adjustment of the mA and/or kV according to patient size and/or use of iterative reconstruction technique. CONTRAST:  80mL OMNIPAQUE IOHEXOL 300 MG/ML  SOLN COMPARISON:  None Available. FINDINGS: Lower chest:  No contributory findings. Hepatobiliary: No focal liver abnormality.No evidence of biliary obstruction or stone. Pancreas: Unremarkable.  Suspect pancreas divisum. Spleen: Unremarkable. Adrenals/Urinary Tract: Negative adrenals. No hydronephrosis or stone.  Unremarkable bladder. Stomach/Bowel:  No obstruction. No appendicitis. Vascular/Lymphatic: No acute vascular abnormality. No mass or adenopathy. Reproductive:No pathologic findings. Other: No ascites or pneumoperitoneum. Musculoskeletal: No acute abnormalities. IMPRESSION: Negative abdominal CT. Electronically Signed   By: Tiburcio Pea M.D.   On: 09/09/2023 04:58   MR Cervical Spine W and Wo Contrast Result  Date: 09/09/2023 CLINICAL DATA:  Demyelinating disease. Increased pain for 2 weeks. Numbness from abdomen to feet with pain. EXAM: MRI CERVICAL, THORACIC AND LUMBAR SPINE WITHOUT AND WITH CONTRAST TECHNIQUE: Multiplanar and multiecho pulse sequences of the cervical spine, to include the craniocervical junction and cervicothoracic junction, and thoracic and lumbar spine, were obtained without and with intravenous contrast. CONTRAST:  4mL GADAVIST GADOBUTROL 1 MMOL/ML IV SOLN COMPARISON:  None Available. FINDINGS: MRI CERVICAL SPINE FINDINGS Alignment: Physiologic. Vertebrae: No fracture, evidence of discitis, or bone lesion. Cord: Normal in the cervical region Posterior Fossa, vertebral arteries, paraspinal tissues: No perispinal mass or inflammation. Disc levels: No degeneration or impingement. MRI THORACIC SPINE FINDINGS Alignment:  Normal Vertebrae: No fracture, evidence of discitis, or aggressive bone lesion. No bony infarct detected. T8 hemangioma. Cord: T2 hyperintensity in the cord spanning T1-T9, involving the majority of the cord centered at the midportion. No abnormal flow voids. Mild if any so swelling and no detectable enhancement (very motion degraded). The lack of swelling and enhancement argues against tumor. Spinal cord infarct is considered given the gray matter involvement and location, but more subacute symptom onset with no provided history of trauma and a positive history of lupus. Paraspinal and other soft tissues: Negative. Normal flow void in the aorta. Disc levels: No degenerative changes or impingement MRI LUMBAR SPINE FINDINGS Segmentation:  Standard. Alignment:  Physiologic. Vertebrae:  No fracture, evidence of discitis, or bone lesion. Conus medullaris and cauda equina: Conus extends to the L2 level. Conus and cauda equina appear normal. Paraspinal and other soft tissues: No perispinal mass or inflammation. Disc levels: No degenerative change or impingement Intermittent motion artifact  IMPRESSION: Longitudinally extensive spinal cord lesion spanning T1-T9, history of lupus suggesting autoimmune cause. Electronically Signed   By: Tiburcio Pea M.D.   On: 09/09/2023 04:26   MR THORACIC SPINE W WO CONTRAST Result Date: 09/09/2023 CLINICAL DATA:  Demyelinating disease. Increased pain for 2 weeks. Numbness from abdomen to feet with pain. EXAM: MRI CERVICAL, THORACIC AND LUMBAR SPINE WITHOUT AND WITH CONTRAST TECHNIQUE: Multiplanar and multiecho pulse sequences of the cervical spine, to include the craniocervical junction and cervicothoracic junction, and thoracic and lumbar spine, were obtained without and with intravenous contrast. CONTRAST:  4mL GADAVIST GADOBUTROL 1 MMOL/ML IV SOLN COMPARISON:  None Available. FINDINGS: MRI CERVICAL SPINE FINDINGS Alignment: Physiologic. Vertebrae: No fracture, evidence of discitis, or bone lesion. Cord: Normal in the cervical region Posterior Fossa, vertebral arteries, paraspinal tissues: No perispinal mass or inflammation. Disc levels: No degeneration or impingement. MRI THORACIC SPINE FINDINGS Alignment:  Normal Vertebrae: No fracture, evidence of discitis, or aggressive bone lesion. No bony infarct detected. T8 hemangioma. Cord: T2 hyperintensity in the cord spanning T1-T9, involving the majority of the cord centered at the midportion. No abnormal flow voids. Mild if any so swelling and no detectable enhancement (very motion degraded). The lack of swelling and enhancement argues against tumor. Spinal cord infarct is considered given the gray matter involvement and location, but more subacute symptom onset with no provided history of trauma and a positive history of lupus. Paraspinal and other soft tissues: Negative. Normal flow void in the aorta. Disc levels:  No degenerative changes or impingement MRI LUMBAR SPINE FINDINGS Segmentation:  Standard. Alignment:  Physiologic. Vertebrae:  No fracture, evidence of discitis, or bone lesion. Conus medullaris and  cauda equina: Conus extends to the L2 level. Conus and cauda equina appear normal. Paraspinal and other soft tissues: No perispinal mass or inflammation. 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 Electronically Signed   By: Tiburcio Pea M.D.   On: 09/09/2023 04:26   MR Lumbar Spine W Wo Contrast Result Date: 09/09/2023 CLINICAL DATA:  Demyelinating disease. Increased pain for 2 weeks. Numbness from abdomen to feet with pain. EXAM: MRI CERVICAL, THORACIC AND LUMBAR SPINE WITHOUT AND WITH CONTRAST TECHNIQUE: Multiplanar and multiecho pulse sequences of the cervical spine, to include the craniocervical junction and cervicothoracic junction, and thoracic and lumbar spine, were obtained without and with intravenous contrast. CONTRAST:  4mL GADAVIST GADOBUTROL 1 MMOL/ML IV SOLN COMPARISON:  None Available. FINDINGS: MRI CERVICAL SPINE FINDINGS Alignment: Physiologic. Vertebrae: No fracture, evidence of discitis, or bone lesion. Cord: Normal in the cervical region Posterior Fossa, vertebral arteries, paraspinal tissues: No perispinal mass or inflammation. Disc levels: No degeneration or impingement. MRI THORACIC SPINE FINDINGS Alignment:  Normal Vertebrae: No fracture, evidence of discitis, or aggressive bone lesion. No bony infarct detected. T8 hemangioma. Cord: T2 hyperintensity in the cord spanning T1-T9, involving the majority of the cord centered at the midportion. No abnormal flow voids. Mild if any so swelling and no detectable enhancement (very motion degraded). The lack of swelling and enhancement argues against tumor. Spinal cord infarct is considered given the gray matter involvement and location, but more subacute symptom onset with no provided history of trauma and a positive history of lupus. Paraspinal and other soft tissues: Negative. Normal flow  void in the aorta. Disc levels: No degenerative changes or impingement MRI LUMBAR SPINE FINDINGS Segmentation:  Standard. Alignment:  Physiologic. Vertebrae:  No fracture, evidence of discitis, or bone lesion. Conus medullaris and cauda equina: Conus extends to the L2 level. Conus and cauda equina appear normal. Paraspinal and other soft tissues: No perispinal mass or inflammation. 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 Electronically Signed   By: Tiburcio Pea M.D.   On: 09/09/2023 04:26     Scheduled Meds:  enoxaparin (LOVENOX) injection  30 mg Subcutaneous Q24H   pantoprazole  40 mg Oral Q1200   Continuous Infusions:  methylPREDNISolone (SOLU-MEDROL) injection       LOS: 1 day    Time spent:    Zannie Cove, MD [facility]   09/10/2023, 10:39 AM

## 2023-09-11 ENCOUNTER — Inpatient Hospital Stay (HOSPITAL_COMMUNITY): Payer: Medicaid Other

## 2023-09-11 DIAGNOSIS — G373 Acute transverse myelitis in demyelinating disease of central nervous system: Secondary | ICD-10-CM | POA: Diagnosis not present

## 2023-09-11 LAB — MISC LABCORP TEST (SEND OUT): Labcorp test code: 505310

## 2023-09-11 LAB — ANGIOTENSIN CONVERTING ENZYME: Angiotensin-Converting Enzyme: 39 U/L (ref 14–82)

## 2023-09-11 LAB — CMV DNA, QUANTITATIVE, PCR
CMV DNA Quant: NEGATIVE [IU]/mL
Log10 CMV Qn DNA Pl: UNDETERMINED {Log}

## 2023-09-11 LAB — SJOGRENS SYNDROME-B EXTRACTABLE NUCLEAR ANTIBODY: SSB (La) (ENA) Antibody, IgG: >8 AI — ABNORMAL HIGH (ref 0.0–0.9)

## 2023-09-11 LAB — VZV PCR, CSF: VZV PCR, CSF: NEGATIVE

## 2023-09-11 LAB — VDRL, CSF: VDRL Quant, CSF: NONREACTIVE

## 2023-09-11 LAB — HTLV I+II ANTIBODIES, (EIA), BLD: HTLV I/II Ab: NEGATIVE

## 2023-09-11 LAB — T.PALLIDUM AB, TOTAL: T Pallidum Abs: NONREACTIVE

## 2023-09-11 MED ORDER — GADOBUTROL 1 MMOL/ML IV SOLN
4.0000 mL | Freq: Once | INTRAVENOUS | Status: AC | PRN
Start: 2023-09-11 — End: 2023-09-11
  Administered 2023-09-11: 4 mL via INTRAVENOUS

## 2023-09-11 NOTE — Progress Notes (Signed)
PROGRESS NOTE    Stacy Kelley  ZOX:[id number] DOB: 07-Jun-1991 DOA: 09/09/2023 PCP: Jordan Hawks, PA-C   Brief history: Patient is a 32 year old African-American female with past medical history significant for very SLE versus Sjogren syndrome.  Patient was seen by rheumatologist midlevel based out of [place], [place].  Other medical history includes skin rashes and hyperpigmentation, vitamin D deficiency, iron deficiency anemia, and STDs.  Patient was admitted with bilateral lower extremity numbness and pain X 8 days.  Noted to be hypokalemic, MRI C/T/L-spine revealed extensive spinal cord lesion that is suggestive of myelitis.  Patient is currently on 5-day course of high-dose IV Solu-Medrol.  Myelitis symptoms are improving.  Neurology is directing care.  09/11/2023: Patient seen.  Discussed with local rheumatology team over the phone.  Rheumatology team as advised continuing IV Solu-Medrol for now, for patient to follow-up with rheumatology immediately on discharge.  Will follow-up pending workup.  MRI of the brain has been ordered.   Subjective: -Patient reports improvement with high-dose IV steroids.   Assessment and Plan:  Acute transverse myelitis (HCC) -Likely autoimmune related to lupus versus viral -Neurology is directing care. -On high-dose IV Solu-Medrol (5-day course). -Patient will follow-up with rheumatology on discharge. -Workup is in progress. -Follow-up MRI brain result.   -CSF largely unremarkable, oligoclonal bands and IgG index pending, VDRL pending -PT OT consult  Hypokalemia: Replaced Last potassium was 4.1.     Hyperproteinemia -In the setting of autoimmune disease.   Iron deficiency anemia -Mild, monitor  Systemic lupus erythematosus (HCC) Sjogren's syndrome (HCC) -Poorly compliant with Plaquenil -Continue high-dose IV Solu-Medrol. -Immediate follow-up with rheumatology on discharge.     Lower extremity/foot skin rash -?Vasculitis,  could be related to lupus versus recent antibiotics -Resolving.    DVT prophylaxis: SCDs Code Status: Full code Family Communication: None present Disposition Plan: Home pending improvement, steroids  Consultants: Neurology   Procedures:   Antimicrobials:    Objective: Vitals:   09/11/23 1000 09/11/23 1100 09/11/23 1140 09/11/23 1200  BP: (!) 132/104     Pulse: 98 (!) 106  68  Resp: (!) 9 (!) 26  18  Temp:   98.9 F (37.2 C)   TempSrc:   Oral   SpO2: 100% 100%  98%  Weight:      Height:        Intake/Output Summary (Last 24 hours) at 09/11/2023 1336 Last data filed at 09/11/2023 0800 Gross per 24 hour  Intake 66 ml  Output 200 ml  Net -134 ml   Filed Weights   09/09/23 0006  Weight: 43.1 kg    Examination:  General exam: Awake and alert.  Patient is not in any distress.    Respiratory system: Clear to auscultation Cardiovascular system: S1 & S2   Abd: Soft and nontender. Central nervous system: Awake and alert.  Power bilateral lower extremity 3+/5.   Extremities: no edema   Data Reviewed:   CBC: Recent Labs  Lab 09/06/23 0539 09/09/23 0201 09/10/23 0325  WBC 3.6* 5.9 8.1  NEUTROABS 2.1 3.6  --   HGB 12.6 11.1* 11.9*  HCT 38.1 33.5* 36.7  MCV 86.2 85.2 85.0  PLT 258 290 348   Basic Metabolic Panel: Recent Labs  Lab 09/06/23 0539 09/09/23 0201 09/10/23 0325  NA 135 135 135  K 3.1* 2.9* 4.1  CL 102 104 105  CO2 18* 22 22  GLUCOSE 76 106* 111*  BUN 14 12 7   CREATININE 0.57 0.55 0.53  CALCIUM  10.0 9.7 9.9  MG  --  2.3  --   PHOS  --  3.0  --    GFR: Estimated Creatinine Clearance: 69.3 mL/min (by C-G formula based on SCr of 0.53 mg/dL). Liver Function Tests: Recent Labs  Lab 09/06/23 0539 09/09/23 0201 09/09/23 0529 09/10/23 0325  AST 18 14*  --  12*  ALT 12 10  --  11  ALKPHOS 59 52  --  50  BILITOT 1.0 0.5  --  0.5  PROT 9.4* 8.9*  --  8.8*  ALBUMIN 4.3 3.9 3.7* 3.9   Recent Labs  Lab 09/06/23 0539 09/09/23 0201   LIPASE 24 25   No results for input(s): "AMMONIA" in the last 168 hours. Coagulation Profile: No results for input(s): "INR", "PROTIME" in the last 168 hours. Cardiac Enzymes: No results for input(s): "CKTOTAL", "CKMB", "CKMBINDEX", "TROPONINI" in the last 168 hours. BNP (last 3 results) No results for input(s): "PROBNP" in the last 8760 hours. HbA1C: No results for input(s): "HGBA1C" in the last 72 hours. CBG: No results for input(s): "GLUCAP" in the last 168 hours. Lipid Profile: No results for input(s): "CHOL", "HDL", "LDLCALC", "TRIG", "CHOLHDL", "LDLDIRECT" in the last 72 hours. Thyroid Function Tests: No results for input(s): "TSH", "T4TOTAL", "FREET4", "T3FREE", "THYROIDAB" in the last 72 hours. Anemia Panel: No results for input(s): "VITAMINB12", "FOLATE", "FERRITIN", "TIBC", "IRON", "RETICCTPCT" in the last 72 hours. Urine analysis:    Component Value Date/Time   COLORURINE YELLOW 09/09/2023 0211   APPEARANCEUR CLEAR 09/09/2023 0211   LABSPEC >1.046 (H) 09/09/2023 0211   PHURINE 6.0 09/09/2023 0211   GLUCOSEU NEGATIVE 09/09/2023 0211   HGBUR NEGATIVE 09/09/2023 0211   BILIRUBINUR NEGATIVE 09/09/2023 0211   BILIRUBINUR neg 05/10/2015 1429   KETONESUR 5 (A) 09/09/2023 0211   PROTEINUR NEGATIVE 09/09/2023 0211   UROBILINOGEN 0.2 07/05/2022 1433   NITRITE NEGATIVE 09/09/2023 0211   LEUKOCYTESUR NEGATIVE 09/09/2023 0211   Sepsis Labs: @LABRCNTIP (procalcitonin:4,lacticidven:4)  )No results found for this or any previous visit (from the past 240 hours).   Radiology Studies: CT HEAD WO CONTRAST ( ) Result Date: 09/09/2023 CLINICAL DATA:  Bilateral leg weakness. History of lupus. Progressive myalgia and paresthesia. EXAM: CT HEAD WITHOUT CONTRAST TECHNIQUE: Contiguous axial images were obtained from the base of the skull through the vertex without intravenous contrast. RADIATION DOSE REDUCTION: This exam was performed according to the departmental dose-optimization  program which includes automated exposure control, adjustment of the mA and/or kV according to patient size and/or use of iterative reconstruction technique. COMPARISON:  None Available. FINDINGS: Brain: The brain shows a normal appearance without evidence of malformation, atrophy, old or acute small or large vessel infarction, mass lesion, hemorrhage, hydrocephalus or extra-axial collection. Vascular: No hyperdense vessel. No evidence of atherosclerotic calcification. Skull: Normal.  No traumatic finding.  No focal bone lesion. Sinuses/Orbits: Sinuses are clear. Orbits appear normal. Mastoids are clear. Other: None significant IMPRESSION: Normal head CT. Electronically Signed   By: Paulina Fusi M.D.   On: 09/09/2023 18:18     Scheduled Meds:  pantoprazole  40 mg Oral Q1200   senna-docusate  1 tablet Oral BID   Continuous Infusions:  methylPREDNISolone (SOLU-MEDROL) injection 1,000 mg (09/11/23 1217)     LOS: 2 days    Time spent:    Berton Mount, MD [facility]   09/11/2023, 1:36 PM

## 2023-09-11 NOTE — Plan of Care (Signed)

## 2023-09-11 NOTE — Progress Notes (Addendum)
NEUROLOGY CONSULT FOLLOW UP NOTE   Date of service: September 11, 2023 Patient Name: Stacy Kelley MRN:  [id number] DOB:  [birth year]  Brief HPI  [name] is a 32 y.o. female with pertinent PMH of SLE (diagnosed on 2023, not on treatment), vitamin B6 deficiency, genital herpes, substance use disorder who presented with sudden onset bilateral LE numbness, pain, and weakness 5 days prior that had continued to progress, now affecting lower torso through LE and with urinary frequency and incontinence. She has been taking prednisone OP (started 12/08) which has not provided any relief. 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 She was given solumedrol 1 g x1 and admitted for further work-up and treatment. Interval Hx/subjective   Does endorse some improvement of sensation, particularly to her RLE. She is emotional at having to remove her wig for the MRI but understands that this test is needed and is going to get some help removing the wig.   Denies symptoms of fatigue, vision changes/blurry vision, neck pain or altered UE sensation, weakness, heat intolerence prior to this episode. Does endorse recently being evaluated for floaters and this happened right around the time that presenting symptoms began.  Subjectively, she feels like the numbness is starting to "wear off."  Vitals   Vitals:   09/11/23 0441 09/11/23 0442 09/11/23 0700 09/11/23 0759  BP:   (!) 123/95   Pulse: 62 62 77   Resp: 17 17 20 16   Temp:      TempSrc:      SpO2: 98% 99% 99%   Weight:      Height:         Body mass index is 17.38 kg/m.  Physical Exam   Constitutional: Appears well-developed and well-nourished.  Psych: Affect appropriate to situation. Tearful. Eyes: No scleral injection.  HENT: No OP obstrucion.  Head: Normocephalic.  Cardiovascular: Normal rate and regular rhythm.  Respiratory: Effort normal, non-labored breathing.  GI: Soft.  No distension. There is no  tenderness.  Skin: WDI. Bilateral feet with macular rash, L>R, minimally changed from prior if at all. Areas of hyperpigmentation noted over bilateral hands and soles of feet, reportedly chronic per patient.  Neurologic Examination   Mental Status: Patient is awake, alert, oriented x3. No signs of aphasia or neglect. Cranial Nerves: II: Pupils equal, round, and reactive to light.   III,IV, VI: EOMI without ptosis or diploplia.  V: Facial sensation is symmetric to light touch and temperature. VII: Facial movement is symmetric.  VIII: Hearing is intact to voice X: Uvula elevates symmetrically XI: Shoulder shrug is symmetric. XII: Tongue is midline without atrophy or fasciculations.  Motor: Normal effort thorughout, at least 5/5 bilateral UE, 5/5 bilateral LE. Gait assessed, is slowed but ambulates with assistance of walking only. Unable to have gradual control of LE to the bed after performing strength assessments or walking, rather the limbs fall. Sensory: Able to process sensation of general touch in bilateral LE. Now able to feel pain on bilateral LE, R>L. Still reports numbness of perineal area. Sensation changes begin proximal to umbilicus.   Medications  Current Facility-Administered Medications:    acetaminophen (TYLENOL) tablet 650 mg, 650 mg, Oral, Q6H PRN **OR** acetaminophen (TYLENOL) suppository 650 mg, 650 mg, Rectal, Q6H PRN, Bobette Mo, MD   methylPREDNISolone sodium succinate (SOLU-MEDROL) 1,000 mg in sodium chloride 0.9 % 50 mL IVPB, 1,000 mg, Intravenous, Daily, Zannie Cove, MD, Stopped at 09/10/23 1446   ondansetron (ZOFRAN) tablet  4 mg, 4 mg, Oral, Q6H PRN **OR** ondansetron (ZOFRAN) injection 4 mg, 4 mg, Intravenous, Q6H PRN, Bobette Mo, MD, 4 mg at 09/10/23 1821   pantoprazole (PROTONIX) EC tablet 40 mg, 40 mg, Oral, Q1200, Zannie Cove, MD, 40 mg at 09/10/23 1305   senna-docusate (Senokot-S) tablet 1 tablet, 1 tablet, Oral, BID, Zannie Cove, MD, 1 tablet at 09/10/23 2242 Labs and Diagnostic Imaging   CSF studies: RBC: 0 WBC: 2 Differential: Too few to count Glucose 40 Protein 52  IgG CSF index -IgG, CSF 11.3 -Albumin, CSF 23 -IgG, serum 2050 -Albumin 3.7 -IgG/Albumin ratio, CSF 0.49 -CSF IgG index 0.9  RPR negative Meningitis/encephalitis panel negative ESR elevated 85 SSB antibody elevated >8.0 Vitamin D low 10.32  Pending studies: Oligoclonal bands VDRL, CSF VZV PCR, CSF T.pallidum antibody CMV DNA PCR Neuromyelitis optica autoantibody Anti-MOG ANCA profile ACE, serum HTLV 1+2 RF  CT Head without contrast: Normal head CT.  MRI cervical, thoracic, lumbar spine w/w/o contrast: Longitudinally extensive spinal cord lesion spanning T1-T9, history of lupus suggesting autoimmune cause.  MRI brain Ordered, pending  Assessment   Stacy PETTI is a 32 y.o. female with past medical history significant for lupus, vitamin B6 deficiency, genital herpes, gonorrhea, substance syndrome who presented 12/11 to the [facility], ED complaining of progressive bilateral leg weakness, numbness and pain x 5 days, with a sensory level at her lower thorax. MRI revealed a longitudinally extensive acute nonenhancing spinal cord lesion extending from the T1 to the T9 level.    Exam with improved motor/strength exam and subjectively improved sensation, in addition to feeling like the numbness is gradually improving.   Day 3/5 of solumedrol therapy for transverse myelitis. Several labs still pending. Lupus-related transverse myelitis remains at top of differential.  Recommendations   -Follow remaining lab results -F/u MRI brain, ordered, pending -Continue solumedrol 1,000 mg daily for total 5 days, today is day 3/5 -Protonix for GI prophylaxis, daily CBC and BMP, monitor CBGs -Will hold off on PLEX for now given  improvement  ______________________________________________________________________   Signed, Champ Mungo, DO Internal Medicine PGY-3  I have seen the patient reviewed the above note.  She has some improvement in her lower extremity strength, and is feeling sensation better than she was.  Will continue steroids for 5 days.  Neurological continue to follow.  Ritta Slot, MD [facility] [phone]  If 7pm- 7am, please page neurology on call as listed in [facility].

## 2023-09-11 NOTE — Plan of Care (Signed)

## 2023-09-11 NOTE — Progress Notes (Signed)
Patient is agreeable to MRI, hair piece removed. MRI informed.

## 2023-09-11 NOTE — Evaluation (Signed)
Physical Therapy Evaluation Patient Details Name: Stacy Kelley MRN: [id number] DOB: 03-27-1991 Today's Date: 09/11/2023  History of Present Illness  32 y.o. female presents to [facility] hospital on 09/09/2023 with BLE weakness, numbness, pain, incontinence. 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 PMH includes SLE, genital herpes, substance use disorder.  Clinical Impression  Pt presents to PT with deficits in sensation, gait, balance, strength, power, endurance. Pt has BLE sensation, strength and proprioceptive deficits which result in physical assistance requirements during transfers to prevent potential falls. Pt sinks into knee and hip flexion when initiating transfers, appearing dependent on UE support to rise into BLE extension. Pt was independent prior to admission and demonstrates the potential to return to independence with high intensity inpatient PT services. Patient will benefit from intensive inpatient follow up therapy, >3 hours/day.        If plan is discharge home, recommend the following: A lot of help with walking and/or transfers;A lot of help with bathing/dressing/bathroom;Assistance with cooking/housework;Assist for transportation;Help with stairs or ramp for entrance   Can travel by private vehicle        Equipment Recommendations Rolling walker (2 wheels);BSC/3in1  Recommendations for Other Services  Rehab consult    Functional Status Assessment Patient has had a recent decline in their functional status and demonstrates the ability to make significant improvements in function in a reasonable and predictable amount of time.     Precautions / Restrictions Precautions Precautions: Fall Restrictions Weight Bearing Restrictions Per Provider Order: No      Mobility  Bed Mobility Overal bed mobility: Needs Assistance Bed Mobility: Supine to Sit     Supine to sit: Supervision, HOB elevated          Transfers Overall transfer level: Needs assistance Equipment  used: Rolling walker (2 wheels) Transfers: Sit to/from Stand, Bed to chair/wheelchair/BSC Sit to Stand: Min assist Stand pivot transfers: Min assist Step pivot transfers: Contact guard assist            Ambulation/Gait Ambulation/Gait assistance: Min assist Gait Distance (Feet): 50 Feet Assistive device: Rolling walker (2 wheels) Gait Pattern/deviations: Step-to pattern Gait velocity: reduced Gait velocity interpretation: <1.31 ft/sec, indicative of household ambulator   General Gait Details: slowed step-to gait, increased knee flexion bilaterally during stance phase  Stairs            Wheelchair Mobility     Tilt Bed    Modified Rankin (Stroke Patients Only)       Balance Overall balance assessment: Needs assistance Sitting-balance support: No upper extremity supported, Feet supported Sitting balance-Leahy Scale: Fair     Standing balance support: Bilateral upper extremity supported, Reliant on assistive device for balance Standing balance-Leahy Scale: Poor                               Pertinent Vitals/Pain Pain Assessment Pain Assessment: Faces Faces Pain Scale: Hurts even more Pain Location: back Pain Descriptors / Indicators: Aching Pain Intervention(s): Monitored during session    Home Living Family/patient expects to be discharged to:: Private residence Living Arrangements: Other (Comment);Children (pt's children's father) Available Help at Discharge: Family;Available PRN/intermittently Type of Home: Apartment Home Access: Stairs to enter Entrance Stairs-Rails: Right;Left Entrance Stairs-Number of Steps: flight   Home Layout: One level Home Equipment: None      Prior Function Prior Level of Function : Independent/Modified Independent;Driving  Extremity/Trunk Assessment   Upper Extremity Assessment Upper Extremity Assessment: Overall WFL for tasks assessed    Lower Extremity Assessment Lower  Extremity Assessment: Generalized weakness;RLE deficits/detail;LLE deficits/detail RLE Sensation: decreased light touch;decreased proprioception LLE Sensation: decreased light touch;decreased proprioception    Cervical / Trunk Assessment Cervical / Trunk Assessment: Normal  Communication   Communication Communication: No apparent difficulties Cueing Techniques: Verbal cues  Cognition Arousal: Alert Behavior During Therapy: WFL for tasks assessed/performed Overall Cognitive Status: Within Functional Limits for tasks assessed                                          General Comments General comments (skin integrity, edema, etc.): VSS on RA    Exercises     Assessment/Plan    PT Assessment Patient needs continued PT services  PT Problem List Decreased strength;Decreased activity tolerance;Decreased balance;Decreased mobility;Decreased knowledge of use of DME;Decreased knowledge of precautions;Impaired sensation;Pain       PT Treatment Interventions DME instruction;Stair training;Gait training;Functional mobility training;Therapeutic activities;Therapeutic exercise;Balance training;Neuromuscular re-education;Patient/family education    PT Goals (Current goals can be found in the Care Plan section)  Acute Rehab PT Goals Patient Stated Goal: to return to independence PT Goal Formulation: With patient Time For Goal Achievement: 09/25/23 Potential to Achieve Goals: Fair    Frequency Min 1X/week     Co-evaluation               AM-PAC PT "6 Clicks" Mobility  Outcome Measure Help needed turning from your back to your side while in a flat bed without using bedrails?: A Little Help needed moving from lying on your back to sitting on the side of a flat bed without using bedrails?: A Little Help needed moving to and from a bed to a chair (including a wheelchair)?: A Little Help needed standing up from a chair using your arms (e.g., wheelchair or bedside  chair)?: A Little Help needed to walk in hospital room?: A Little Help needed climbing 3-5 steps with a railing? : Total 6 Click Score: 16    End of Session Equipment Utilized During Treatment: Gait belt Activity Tolerance: Patient tolerated treatment well Patient left: in chair;with call bell/phone within reach;with chair alarm set Nurse Communication: Mobility status PT Visit Diagnosis: Other abnormalities of gait and mobility (R26.89);Muscle weakness (generalized) (M62.81);Other symptoms and signs involving the nervous system (R29.898)    Time: 1710-1750 PT Time Calculation (min) (ACUTE ONLY): 40 min   Charges:   PT Evaluation $PT Eval Low Complexity: 1 Low PT Treatments $Therapeutic Activity: 8-22 mins PT General Charges $$ ACUTE PT VISIT: 1 Visit         Arlyss Gandy, PT, DPT Acute Rehabilitation Office [phone]   Arlyss Gandy 09/11/2023, 5:56 PM

## 2023-09-11 NOTE — Progress Notes (Signed)
Approached conversation regarding the need to remove hair piece, patient refuses to remove hair piece.   Primary team informed.

## 2023-09-12 ENCOUNTER — Inpatient Hospital Stay (HOSPITAL_COMMUNITY): Payer: Medicaid Other

## 2023-09-12 DIAGNOSIS — G373 Acute transverse myelitis in demyelinating disease of central nervous system: Secondary | ICD-10-CM | POA: Diagnosis not present

## 2023-09-12 LAB — RHEUMATOID FACTOR: Rheumatoid fact SerPl-aCnc: 11.1 [IU]/mL (ref <14.0)

## 2023-09-12 MED ORDER — GABAPENTIN 300 MG PO CAPS
300.0000 mg | ORAL_CAPSULE | Freq: Three times a day (TID) | ORAL | Status: DC
  Administered 2023-09-12 (×2): 300 mg via ORAL
  Filled 2023-09-12 (×6): qty 1

## 2023-09-12 MED ORDER — SUCRALFATE 1 GM/10ML PO SUSP
1.0000 g | Freq: Three times a day (TID) | ORAL | Status: DC
  Administered 2023-09-12 (×2): 1 g via ORAL
  Filled 2023-09-12 (×20): qty 10

## 2023-09-12 MED ORDER — PANTOPRAZOLE SODIUM 40 MG PO TBEC
40.0000 mg | DELAYED_RELEASE_TABLET | Freq: Two times a day (BID) | ORAL | Status: DC
  Administered 2023-09-12 (×2): 40 mg via ORAL
  Filled 2023-09-12 (×7): qty 1
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
## 2023-09-12 NOTE — PMR Pre-admission (Signed)
PMR Admission Coordinator Pre-Admission Assessment  Patient: Stacy Kelley is an 32 y.o., female MRN: [id number] DOB: 1990/10/11 Height: 5\' 2"  (157.5 cm) Weight: 43.1 kg  Insurance Information HMO: ***    PPO: ***     PCP:      IPA:      80/20:      OTHER:  PRIMARY: [place] [employer] of       Policy#: [id number]      Subscriber: patient CM Name: ***      Phone#: ***     Fax#: *** Pre-Cert#: ***      Employer: *** Benefits:  Phone #: ***     Name: *** Dolores Hoose. Date: ***     Deduct: ***      Out of Pocket Max: ***      Life Max: *** CIR: ***      SNF: *** Outpatient: ***     Co-Pay: *** Home Health: ***      Co-Pay: *** DME: ***     Co-Pay: *** Providers: in-network SECONDARY:       Policy#:      Phone#:   Financial Counselor:       Phone#:   The Data processing manager" for patients in Inpatient Rehabilitation Facilities with attached "Privacy Act Statement-Health Care Records" was provided and verbally reviewed with: {CHL IP Patient Family NW:295621308}  Emergency Contact Information Contact Information     Name Relation Home Work Mobile   [place] Sister   [phone]   McDougal,Korree Significant other   [phone]      Other Contacts     Name Relation Home Work Mobile   [place] Grandmother [phone]  [phone]       Current Medical History  Patient Admitting Diagnosis: transverse myelitis History of Present Illness: Pt is a  31 year old female with medical hx significant for: SLE, genital herpes, gonorrhea, Sjogren syndrome, hyperpigmentation, thyromegaly, vitamin D deficiency, iron deficiency anemia.  Pt presented to [facility] on 09/08/23 d/t progression of myalgia and paresthesia. Pt developed incontinence and also had bilateral foot rash. Pt recently began taking prednisone. 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 Pt started on 1g Solu-medrol. CT abdomen/pelvis negative.  Neurology consulted. CT head negative. LP performed. Pt transferred to [facility] on 12/11.*** Therapy evaluations completed and CIR recommended d/t pt's deficits in functional mobility.     Patient's medical record from [facility] has been reviewed by the rehabilitation admission coordinator and physician.  Past Medical History  Past Medical History:  Diagnosis Date   Genital herpes    Gonorrhea    Sjogren's syndrome (HCC) 05/12/2022   SLE (systemic lupus erythematosus) (HCC)    Thyromegaly 09/03/2022   Vitamin D deficiency 06/11/2022    Has the patient had major surgery during 100 days prior to admission? No  Family History   family history includes Diabetes in her maternal grandfather, maternal grandmother, paternal grandfather, and paternal grandmother; Hypertension in her paternal grandfather.  Current Medications  Current Facility-Administered Medications:    acetaminophen (TYLENOL) tablet 650 mg, 650 mg, Oral, Q6H PRN, 650 mg at 09/12/23 1223 **OR** acetaminophen (TYLENOL) suppository 650 mg, 650 mg, Rectal, Q6H PRN, Bobette Mo, MD   gabapentin (NEURONTIN) capsule 300 mg, 300 mg, Oral, TID, Berton Mount I, MD, 300 mg at 09/12/23 1224   methylPREDNISolone sodium succinate (SOLU-MEDROL) 1,000 mg in sodium chloride 0.9 % 50 mL IVPB, 1,000 mg, Intravenous, Daily, Zannie Cove,  MD, Last Rate: 66 mL/hr at 09/12/23 1300, Infusion Verify at 09/12/23 1300   ondansetron (ZOFRAN) tablet 4 mg, 4 mg, Oral, Q6H PRN **OR** ondansetron (ZOFRAN) injection 4 mg, 4 mg, Intravenous, Q6H PRN, Bobette Mo, MD, 4 mg at 09/10/23 1821   pantoprazole (PROTONIX) EC tablet 40 mg, 40 mg, Oral, BID, Berton Mount I, MD, 40 mg at 09/12/23 1000   senna-docusate (Senokot-S) tablet 1 tablet, 1 tablet, Oral, BID, Zannie Cove, MD, 1 tablet at 09/12/23 1000   sucralfate (CARAFATE) 1 GM/10ML suspension 1 g, 1 g, Oral, TID WC & HS, Berton Mount I, MD, 1 g at  09/12/23 1222  Patients Current Diet:  Diet Order             Diet regular Fluid consistency: Thin  Diet effective now                   Precautions / Restrictions Precautions Precautions: Fall Restrictions Weight Bearing Restrictions Per Provider Order: No   Has the patient had 2 or more falls or a fall with injury in the past year? Yes  Prior Activity Level Community (5-7x/wk): works, drives, gets out of house frequently  Prior Functional Level Self Care: Did the patient need help bathing, dressing, using the toilet or eating? Independent  Indoor Mobility: Did the patient need assistance with walking from room to room (with or without device)? Independent  Stairs: Did the patient need assistance with internal or external stairs (with or without device)? Independent  Functional Cognition: Did the patient need help planning regular tasks such as shopping or remembering to take medications? Independent  Patient Information Are you of Hispanic, Latino/a,or Spanish origin?: A. No, not of Hispanic, Latino/a, or Spanish origin What is your race?: B. Black or African American Do you need or want an interpreter to communicate with a doctor or health care staff?: 0. No  Patient's Response To:  Health Literacy and Transportation Is the patient able to respond to health literacy and transportation needs?: Yes Health Literacy - How often do you need to have someone help you when you read instructions, pamphlets, or other written material from your doctor or pharmacy?: Never In the past 12 months, has lack of transportation kept you from medical appointments or from getting medications?: No In the past 12 months, has lack of transportation kept you from meetings, work, or from getting things needed for daily living?: No  Home Assistive Devices / Equipment Home Equipment: None  Prior Device Use: Indicate devices/aids used by the patient prior to current illness, exacerbation or  injury? None of the above  Current Functional Level Cognition  Overall Cognitive Status: Within Functional Limits for tasks assessed Orientation Level: Oriented X4    Extremity Assessment (includes Sensation/Coordination)  Upper Extremity Assessment: Generalized weakness  Lower Extremity Assessment: Generalized weakness, RLE deficits/detail, LLE deficits/detail RLE Sensation: decreased light touch, decreased proprioception LLE Sensation: decreased light touch, decreased proprioception    ADLs  Overall ADL's : Needs assistance/impaired Eating/Feeding: Independent, Sitting Grooming: Contact guard assist, Standing, Wash/dry hands Upper Body Bathing: Set up, Sitting Lower Body Bathing: Minimal assistance, Sit to/from stand Upper Body Dressing : Set up, Sitting Lower Body Dressing: Minimal assistance, Sit to/from stand Toilet Transfer: Minimal assistance, Ambulation, Rolling walker (2 wheels), Grab bars, Comfort height toilet Toileting- Clothing Manipulation and Hygiene: Contact guard assist, Sit to/from stand    Mobility  Overal bed mobility: Needs Assistance Bed Mobility: Supine to Sit Supine to sit: Supervision, HOB elevated General  bed mobility comments: pt up in recliner upon entry to room    Transfers  Overall transfer level: Needs assistance Equipment used: Rolling walker (2 wheels) Transfers: Sit to/from Stand Sit to Stand: Min assist Bed to/from chair/wheelchair/BSC transfer type:: Step pivot, Stand pivot Stand pivot transfers: Min assist Step pivot transfers: Contact guard assist General transfer comment: ambulated 50 feet with RW at a slow speen with VCs to look up and not down at her feet/floor    Ambulation / Gait / Stairs / Wheelchair Mobility  Ambulation/Gait Ambulation/Gait assistance: Editor, commissioning (Feet): 50 Feet Assistive device: Rolling walker (2 wheels) Gait Pattern/deviations: Step-to pattern General Gait Details: slowed step-to gait,  increased knee flexion bilaterally during stance phase Gait velocity: reduced Gait velocity interpretation: <1.31 ft/sec, indicative of household ambulator    Posture / Balance Balance Overall balance assessment: Needs assistance Sitting-balance support: No upper extremity supported, Feet supported Sitting balance-Leahy Scale: Fair Standing balance support: During functional activity, Single extremity supported Standing balance-Leahy Scale: Fair Standing balance comment: standing at sink to wash hands, pull up and down underwear    Special needs/care consideration Skin Rash: foot/bilateral   Previous Home Environment (from acute therapy documentation) Living Arrangements: Other (Comment), Children (children's father) Available Help at Discharge: Family, Available 24 hours/day Type of Home: Apartment Home Layout: One level Home Access: Stairs to enter Entrance Stairs-Rails: Right, Left Entrance Stairs-Number of Steps: flight Bathroom Shower/Tub: Engineer, manufacturing systems: Standard Bathroom Accessibility: Yes How Accessible: Accessible via walker Home Care Services: No  Discharge Living Setting Plans for Discharge Living Setting: Patient's home Type of Home at Discharge: Apartment Discharge Home Layout: One level Discharge Home Access: Stairs to enter Entrance Stairs-Rails: Right, Left Entrance Stairs-Number of Steps: flight Discharge Bathroom Shower/Tub: Tub/shower unit Discharge Bathroom Toilet: Standard Discharge Bathroom Accessibility: Yes How Accessible: Accessible via walker Does the patient have any problems obtaining your medications?: No  Social/Family/Support Systems Anticipated Caregiver: Junious Silk, significant other Anticipated Caregiver's Contact Information: [phone] Caregiver Availability: 24/7 Discharge Plan Discussed with Primary Caregiver: Yes Is Caregiver In Agreement with Plan?: Yes Does Caregiver/Family have Issues with  Lodging/Transportation while Pt is in Rehab?: No  Goals Patient/Family Goal for Rehab: *** Expected length of stay: *** Pt/Family Agrees to Admission and willing to participate: Yes Program Orientation Provided & Reviewed with Pt/Caregiver Including Roles  & Responsibilities: Yes  Decrease burden of Care through IP rehab admission: NA  Possible need for SNF placement upon discharge: Not anticipated  Patient Condition: I have reviewed medical records from [facility], spoken with CM, and patient and spouse. I met with patient at the bedside and discussed via phone for inpatient rehabilitation assessment.  Patient will benefit from ongoing PT and OT, can actively participate in 3 hours of therapy a day 5 days of the week, and can make measurable gains during the admission.  Patient will also benefit from the coordinated team approach during an Inpatient Acute Rehabilitation admission.  The patient will receive intensive therapy as well as Rehabilitation physician, nursing, social worker, and care management interventions.  Due to safety, skin/wound care, disease management, medication administration, pain management, and patient education the patient requires 24 hour a day rehabilitation nursing.  The patient is currently *** with mobility and basic ADLs.  Discharge setting and therapy post discharge at home with home health is anticipated.  Patient has agreed to participate in the Acute Inpatient Rehabilitation Program and will admit {Time; today/tomorrow:10263}.  Preadmission Screen Completed By:  Ralph Leyden  Theodoro Kos, 09/12/2023 4:13 PM ______________________________________________________________________   Discussed status with Dr. Marland Kitchen on *** at *** and received approval for admission today.  Admission Coordinator:  Domingo Pulse, CCC-SLP, time ***/Date ***   Assessment/Plan: Diagnosis: Does the need for close, 24 hr/day Medical supervision in concert with the patient's  rehab needs make it unreasonable for this patient to be served in a less intensive setting? {yes_no_potentially:3041433} Co-Morbidities requiring supervision/potential complications: *** Due to {due ZO:1096045}, does the patient require 24 hr/day rehab nursing? {yes_no_potentially:3041433} Does the patient require coordinated care of a physician, rehab nurse, PT, OT, and SLP to address physical and functional deficits in the context of the above medical diagnosis(es)? {yes_no_potentially:3041433} Addressing deficits in the following areas: {deficits:3041436} Can the patient actively participate in an intensive therapy program of at least 3 hrs of therapy 5 days a week? {yes_no_potentially:3041433} The potential for patient to make measurable gains while on inpatient rehab is {potential:3041437} Anticipated functional outcomes upon discharge from inpatient rehab: {functional outcomes:304600100} PT, {functional outcomes:304600100} OT, {functional outcomes:304600100} SLP Estimated rehab length of stay to reach the above functional goals is: *** Anticipated discharge destination: {anticipated dc setting:21604} 10. Overall Rehab/Functional Prognosis: {potential:3041437}   MD Signature: ***

## 2023-09-12 NOTE — Progress Notes (Signed)
Patient family requested to see social worker on Monday 10 am.

## 2023-09-12 NOTE — Progress Notes (Signed)
NEUROLOGY CONSULT FOLLOW UP NOTE   Date of service: September 12, 2023 Patient Name: Stacy Kelley MRN:  [id number] DOB:  [birth year]  Brief HPI  Stacy Kelley is a 32 y.o. female with pertinent PMH of SLE (diagnosed on 2023, not on treatment), vitamin B6 deficiency, genital herpes, substance use disorder who presented with sudden onset bilateral LE numbness, pain, and weakness 5 days prior that had continued to progress, now affecting lower torso through LE and with urinary frequency and incontinence. She has been taking prednisone OP (started 12/08) which has not provided any relief. 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 She was given solumedrol 1 g x1 and admitted for further work-up and treatment. Interval Hx/subjective   Does endorse some improvement of sensation, particularly to her RLE. She is emotional at having to remove her wig for the MRI but understands that this test is needed and is going to get some help removing the wig.   Denies symptoms of fatigue, vision changes/blurry vision, neck pain or altered UE sensation, weakness, heat intolerence prior to this episode. Does endorse recently being evaluated for floaters and this happened right around the time that presenting symptoms began.  Subjectively, she feels like the numbness is starting to "wear off."  Vitals   Vitals:   09/12/23 0727 09/12/23 0729 09/12/23 0900 09/12/23 1000  BP:  123/72 (!) 150/104 (!) 154/99  Pulse:  87 71 70  Resp: (!) 29 20 16 11   Temp:      TempSrc:      SpO2:  97% 100% 100%  Weight:      Height:         Body mass index is 17.38 kg/m.  Physical Exam   Constitutional: Appears well-developed and well-nourished.  Psych: Affect appropriate to situation. Tearful. Eyes: No scleral injection.  HENT: No OP obstrucion.  Head: Normocephalic.  Cardiovascular: Normal rate and regular rhythm.  Respiratory: Effort normal, non-labored breathing.  GI: Soft.  No  distension. There is no tenderness.  Skin: WDI. Bilateral feet with macular rash, L>R, minimally changed from prior if at all. Areas of hyperpigmentation noted over bilateral hands and soles of feet, reportedly chronic per patient.  Neurologic Examination   Mental Status: Patient is awake, alert, oriented x3. No signs of aphasia or neglect. Cranial Nerves: II: Pupils equal, round, and reactive to light.   III,IV, VI: EOMI without ptosis or diploplia.  V: Facial sensation is symmetric to light touch and temperature. VII: Facial movement is symmetric.  VIII: Hearing is intact to voice X: Uvula elevates symmetrically XI: Shoulder shrug is symmetric. XII: Tongue is midline without atrophy or fasciculations.  Motor: Normal effort thorughout, at least 5/5 bilateral UE, 5/5 bilateral LE.  Sensory: Able to process sensation of general touch in bilateral LE. Now able to feel pain on bilateral LE, R>L. Still reports numbness of perineal area. Sensation changes begin proximal to umbilicus.   Medications  Current Facility-Administered Medications:    acetaminophen (TYLENOL) tablet 650 mg, 650 mg, Oral, Q6H PRN, 650 mg at 09/12/23 1013 **OR** acetaminophen (TYLENOL) suppository 650 mg, 650 mg, Rectal, Q6H PRN, Bobette Mo, MD   methylPREDNISolone sodium succinate (SOLU-MEDROL) 1,000 mg in sodium chloride 0.9 % 50 mL IVPB, 1,000 mg, Intravenous, Daily, Zannie Cove, MD, Stopped at 09/11/23 1318   ondansetron (ZOFRAN) tablet 4 mg, 4 mg, Oral, Q6H PRN **OR** ondansetron (ZOFRAN) injection 4 mg, 4 mg, Intravenous, Q6H PRN, Bobette Mo, MD, 4  mg at 09/10/23 1821   pantoprazole (PROTONIX) EC tablet 40 mg, 40 mg, Oral, BID, Berton Mount I, MD, 40 mg at 09/12/23 1000   senna-docusate (Senokot-S) tablet 1 tablet, 1 tablet, Oral, BID, Zannie Cove, MD, 1 tablet at 09/12/23 1000   sucralfate (CARAFATE) 1 GM/10ML suspension 1 g, 1 g, Oral, TID WC & HS, Barnetta Chapel, MD Labs and  Diagnostic Imaging   CSF studies: RBC: 0 WBC: 2 Differential: Too few to count Glucose 40 Protein 52 CSF VDRL negative CSF VZV negative   IgG CSF index -IgG, CSF 11.3 -Albumin, CSF 23 -IgG, serum 2050 -Albumin 3.7 -IgG/Albumin ratio, CSF 0.49 -CSF IgG index 0.9  Serum: RPR negative T palladium negative Meningitis/encephalitis panel negative ESR elevated 85 SSB antibody elevated >8.0 Vitamin D low 10.32 Anti-MOG negative CMV, RPR - negative HIV ab negative HTLV 1+2 ab -negative Serum ACE negative Serum RF negative  Pending studies: Oligoclonal bands Neuromyelitis optica autoantibody ANCA profile     CT Head without contrast: Normal head CT.  MRI cervical, thoracic, lumbar spine w/w/o contrast: Longitudinally extensive spinal cord lesion spanning T1-T9, history of lupus suggesting autoimmune cause.  MRI brain Normal  Assessment   Stacy Kelley is a 32 y.o. female with past medical history significant for lupus, vitamin B6 deficiency, genital herpes, gonorrhea, substance syndrome who presented 12/11 to the [facility], ED complaining of progressive bilateral leg weakness, numbness and pain x 5 days, with a sensory level at her lower thorax. MRI revealed a longitudinally extensive acute nonenhancing spinal cord lesion extending from the T1 to the T9 level.    Exam with improved motor/strength exam and subjectively improved sensation, in addition to feeling like the numbness is gradually improving.   Day 4/5 of solumedrol therapy for transverse myelitis. Several labs still pending. Lupus-related transverse myelitis remains at top of differential.  Recommendations   -Follow remaining lab results -F/u MRI brain, ordered, pending -Continue solumedrol 1,000 mg daily for total 5 days, today is day 4/5   ______________________________________________________________________  Ritta Slot, MD [facility] [phone]  If 7pm- 7am, please  page neurology on call as listed in AMION.

## 2023-09-12 NOTE — Progress Notes (Signed)
Patient reports onset of nausea soon after an emotional event overnight.   She also reports "feeling like no one has looked at my gallbladder, since I've been here".  Patient denies any history of gallbladder issues, reports pre/post prandial nausea w/o vomiting. Poor appetite and "feeling like my stomach is tight" while pointing at her LLQ.   Primary team informed.

## 2023-09-12 NOTE — Plan of Care (Signed)

## 2023-09-12 NOTE — Progress Notes (Signed)
Inpatient Rehab Admissions:  Inpatient Rehab Consult received.  I met with patient at the bedside for rehabilitation assessment and to discuss goals and expectations of an inpatient rehab admission.  Discussed average length of stay, insurance authorization requirement, and discharge home after completion of CIR. Pt acknowledged understanding. She is interested in pursuing CIR. Pt gave permission to contact significant other Korree. Spoke with Tilden Fossa on the telephone. Explained CIR goals and expectations. He acknowledged understanding. He is supportive of pt pursuing CIR. He confirmed that he would be able to provide 24/7 support for 1-2 weeks for pt after discharge. Will continue to follow.  Signed: Wolfgang Phoenix, MS, CCC-SLP Admissions Coordinator [phone]

## 2023-09-12 NOTE — Progress Notes (Signed)
Transferred to 5 N per w/c, with tele and RN.

## 2023-09-12 NOTE — Evaluation (Signed)
Occupational Therapy Evaluation Patient Details Name: Stacy Kelley MRN: [id number] DOB: [birth year] Today's Date: 09/12/2023   History of Present Illness 32 y.o. female presents to [facility] hospital on 09/09/2023 with BLE weakness, numbness, pain, incontinence. 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 PMH includes SLE, genital herpes, substance use disorder, lupus   Clinical Impression   This 32 yo female admitted with above presents to acute OT with PLOF of being independent with basic ADLs, some IADLs, and driving. Currently is has a lot of pain in her legs and back and thus needs increased A for even basic ADLs (setup-min A from RW level). She will continue to benefit from acute O with follow up from intensive inpatient follow up therapy, >3 hours/day.        If plan is discharge home, recommend the following: A little help with walking and/or transfers;A little help with bathing/dressing/bathroom;Assistance with cooking/housework;Help with stairs or ramp for entrance;Assist for transportation    Functional Status Assessment  Patient has had a recent decline in their functional status and demonstrates the ability to make significant improvements in function in a reasonable and predictable amount of time.  Equipment Recommendations  Tub/shower bench;BSC/3in1    Recommendations for Other Services Rehab consult     Precautions / Restrictions Precautions Precautions: Fall Restrictions Weight Bearing Restrictions Per Provider Order: No      Mobility Bed Mobility Overal bed mobility: Needs Assistance             General bed mobility comments: pt up in recliner upon entry to room    Transfers Overall transfer level: Needs assistance Equipment used: Rolling walker (2 wheels) Transfers: Sit to/from Stand Sit to Stand: Min assist           General transfer comment: ambulated 50 feet with RW at a slow speen with VCs to look up and not down at her feet/floor      Balance  Overall balance assessment: Needs assistance Sitting-balance support: No upper extremity supported, Feet supported Sitting balance-Leahy Scale: Fair     Standing balance support: During functional activity, Single extremity supported Standing balance-Leahy Scale: Fair Standing balance comment: standing at sink to wash hands, pull up and down underwear                           ADL either performed or assessed with clinical judgement   ADL Overall ADL's : Needs assistance/impaired Eating/Feeding: Independent;Sitting   Grooming: Contact guard assist;Standing;Wash/dry hands   Upper Body Bathing: Set up;Sitting   Lower Body Bathing: Minimal assistance;Sit to/from stand   Upper Body Dressing : Set up;Sitting   Lower Body Dressing: Minimal assistance;Sit to/from stand   Toilet Transfer: Minimal assistance;Ambulation;Rolling walker (2 wheels);Grab bars;Comfort height toilet   Toileting- Clothing Manipulation and Hygiene: Contact guard assist;Sit to/from stand               Vision Patient Visual Report: No change from baseline              Pertinent Vitals/Pain Pain Assessment Pain Assessment: 0-10 Pain Score: 8  Pain Location: back (8), legs (6-7) Pain Descriptors / Indicators: Aching, Sore Pain Intervention(s): Limited activity within patient's tolerance, Monitored during session, Premedicated before session, Repositioned     Extremity/Trunk Assessment Upper Extremity Assessment Upper Extremity Assessment: Generalized weakness           Communication Communication Communication: No apparent difficulties   Cognition Arousal: Alert Behavior During Therapy: Flat affect Overall  Cognitive Status: Within Functional Limits for tasks assessed                                                  Home Living Family/patient expects to be discharged to:: Private residence Living Arrangements: Other (Comment);Children (children's  father) Available Help at Discharge: Family;Available PRN/intermittently Type of Home: Apartment Home Access: Stairs to enter Entrance Stairs-Number of Steps: flight Entrance Stairs-Rails: Right;Left Home Layout: One level     Bathroom Shower/Tub: IT trainer: Standard     Home Equipment: None          Prior Functioning/Environment Prior Level of Function : Independent/Modified Independent;Driving                        OT Problem List: Decreased strength;Decreased activity tolerance;Impaired balance (sitting and/or standing);Impaired sensation;Pain      OT Treatment/Interventions: Self-care/ADL training;DME and/or AE instruction;Balance training;Patient/family education;Therapeutic exercise    OT Goals(Current goals can be found in the care plan section) Acute Rehab OT Goals Patient Stated Goal: for pain to be less and get more therapy OT Goal Formulation: With patient Time For Goal Achievement: 09/26/23 Potential to Achieve Goals: Good ADL Goals Pt Will Perform Grooming: with modified independence;sitting;standing Pt Will Perform Upper Body Bathing: with modified independence;sitting Pt Will Perform Lower Body Bathing: with modified independence;sit to/from stand Pt Will Perform Upper Body Dressing: with modified independence;sitting Pt Will Perform Lower Body Dressing: with modified independence;sit to/from stand Pt Will Transfer to Toilet: with modified independence;ambulating;bedside commode (over toilet) Pt Will Perform Toileting - Clothing Manipulation and hygiene: with modified independence;sit to/from stand Pt Will Perform Tub/Shower Transfer: Tub transfer;with modified independence;ambulating;tub bench;rolling walker Additional ADL Goal #1: pt will be Mod I in and OOB for basic ADLs (increased time, HOB flat, no rail  OT Frequency: Min 1X/week       AM-PAC OT "6 Clicks" Daily Activity     Outcome Measure Help from  another person eating meals?: None Help from another person taking care of personal grooming?: A Little Help from another person toileting, which includes using toliet, bedpan, or urinal?: A Little Help from another person bathing (including washing, rinsing, drying)?: A Little Help from another person to put on and taking off regular upper body clothing?: A Little Help from another person to put on and taking off regular lower body clothing?: A Little 6 Click Score: 19   End of Session Equipment Utilized During Treatment: Gait belt;Rolling walker (2 wheels)  Activity Tolerance: Patient tolerated treatment well Patient left: in chair;with call bell/phone within reach (no chair alarm in chair that pt was sititng in upon my arrival--pt cognitively intact to call)  OT Visit Diagnosis: Unsteadiness on feet (R26.81);Other abnormalities of gait and mobility (R26.89);Muscle weakness (generalized) (M62.81);Pain Pain - part of body:  (back and legs)                Time: 7619-5093 OT Time Calculation (min): 50 min Charges:  OT General Charges $OT Visit: 1 Visit OT Evaluation $OT Eval Moderate Complexity: 1 Mod OT Treatments $Self Care/Home Management : 23-37 mins  Lindon Romp OT Acute Rehabilitation Services Office [phone]    Evette Georges 09/12/2023, 2:32 PM

## 2023-09-12 NOTE — Progress Notes (Signed)
PROGRESS NOTE    Stacy HYUN  Kelley:[id number] DOB: Jun 11, 1991 DOA: 09/09/2023 PCP: Jordan Hawks, PA-C   Brief history: Patient is a 32 year old African-American female with past medical history significant for very SLE versus Sjogren syndrome.  Patient was seen by rheumatologist midlevel based out of [place], [place].  Other medical history includes skin rashes and hyperpigmentation, vitamin D deficiency, iron deficiency anemia, and STDs.  Patient was admitted with bilateral lower extremity numbness and pain X 8 days.  Noted to be hypokalemic, MRI C/T/L-spine revealed extensive spinal cord lesion that is suggestive of myelitis.  Patient is currently on 5-day course of high-dose IV Solu-Medrol.  Myelitis symptoms are improving.  Neurology is directing care.  09/11/2023: Patient seen.  Discussed with local rheumatology team over the phone.  Rheumatology team as advised continuing IV Solu-Medrol for now, for patient to follow-up with rheumatology immediately on discharge.  Will follow-up pending workup.  MRI of the brain has been ordered.   09/12/2023: Patient seen alongside patient's sister and nurse.  Patient continues to report nausea and abdominal pain.  Will increase Protonix to 40 Mg p.o. twice daily.  Will also start patient on Carafate.  Patient is worried about possible gallbladder disease.  Will proceed with ultrasound of the abdomen.  Input from the neurology team is highly appreciated.  Gabapentin has been recommended.  Patient be transferred to telemetry floor.  Subjective: -Patient reports some improvement with high-dose IV steroids. -Reports nausea and abdominal pain.  Assessment and Plan:  Acute transverse myelitis (HCC) -Likely autoimmune related to lupus versus viral -Neurology is directing care. -On high-dose IV Solu-Medrol (5-day course). -Patient will follow-up with rheumatology on discharge. -Workup is in progress. -Follow-up MRI brain result.   -CSF  largely unremarkable, oligoclonal bands and IgG index pending, VDRL pending -PT OT consult  Hypokalemia: Replaced Last potassium was 4.1.     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   Iron deficiency anemia -Mild, monitor  Systemic lupus erythematosus (HCC) Sjogren's syndrome (HCC) -Poorly compliant with Plaquenil -Continue high-dose IV Solu-Medrol. -Immediate follow-up with rheumatology on discharge.     Lower extremity/foot skin rash -?Vasculitis, could be related to lupus versus recent antibiotics -Resolving.  Abdominal pain/nausea: -Suspect secondary to high-dose steroids. -Patient also has transverse myelitis. -Increase PPI to twice daily. -Start patient on Carafate.  Concerns for gallbladder disease: -Patient reports concerns for gallbladder disease. -No fever or chills. -No vomiting. -Abdominal tenderness not limited to right upper quadrant. -As patient insists, we will proceed with right upper quadrant ultrasound.  DVT prophylaxis: SCDs Code Status: Full code Family Communication: None present Disposition Plan: Home pending improvement, steroids  Consultants: Neurology   Procedures:   Antimicrobials:    Objective: Vitals:   09/12/23 1100 09/12/23 1153 09/12/23 1215 09/12/23 1511  BP: (!) 142/100   122/87  Pulse: 74 69 72 70  Resp: 10 16 16 14   Temp:  98 F (36.7 C)    TempSrc:  Oral    SpO2: 100% 100% 100% 100%  Weight:      Height:        Intake/Output Summary (Last 24 hours) at 09/12/2023 1558 Last data filed at 09/12/2023 1300 Gross per 24 hour  Intake 22.63 ml  Output --  Net 22.63 ml   Filed Weights   09/09/23 0006  Weight: 43.1 kg    Examination:  General exam: Awake and alert.  Patient is not in any distress.    Respiratory  system: Clear to auscultation Cardiovascular system: S1 & S2   Abd: Vague abdominal tenderness. Central nervous system: Awake and alert.  Power bilateral lower  extremity 3+/5.   Extremities: no edema   Data Reviewed:   CBC: Recent Labs  Lab 09/06/23 0539 09/09/23 0201 09/10/23 0325  WBC 3.6* 5.9 8.1  NEUTROABS 2.1 3.6  --   HGB 12.6 11.1* 11.9*  HCT 38.1 33.5* 36.7  MCV 86.2 85.2 85.0  PLT 258 290 348   Basic Metabolic Panel: Recent Labs  Lab 09/06/23 0539 09/09/23 0201 09/10/23 0325  NA 135 135 135  K 3.1* 2.9* 4.1  CL 102 104 105  CO2 18* 22 22  GLUCOSE 76 106* 111*  BUN 14 12 7   CREATININE 0.57 0.55 0.53  CALCIUM 10.0 9.7 9.9  MG  --  2.3  --   PHOS  --  3.0  --    GFR: Estimated Creatinine Clearance: 69.3 mL/min (by C-G formula based on SCr of 0.53 mg/dL). Liver Function Tests: Recent Labs  Lab 09/06/23 0539 09/09/23 0201 09/09/23 0529 09/10/23 0325  AST 18 14*  --  12*  ALT 12 10  --  11  ALKPHOS 59 52  --  50  BILITOT 1.0 0.5  --  0.5  PROT 9.4* 8.9*  --  8.8*  ALBUMIN 4.3 3.9 3.7* 3.9   Recent Labs  Lab 09/06/23 0539 09/09/23 0201  LIPASE 24 25   No results for input(s): "AMMONIA" in the last 168 hours. Coagulation Profile: No results for input(s): "INR", "PROTIME" in the last 168 hours. Cardiac Enzymes: No results for input(s): "CKTOTAL", "CKMB", "CKMBINDEX", "TROPONINI" in the last 168 hours. BNP (last 3 results) No results for input(s): "PROBNP" in the last 8760 hours. HbA1C: No results for input(s): "HGBA1C" in the last 72 hours. CBG: No results for input(s): "GLUCAP" in the last 168 hours. Lipid Profile: No results for input(s): "CHOL", "HDL", "LDLCALC", "TRIG", "CHOLHDL", "LDLDIRECT" in the last 72 hours. Thyroid Function Tests: No results for input(s): "TSH", "T4TOTAL", "FREET4", "T3FREE", "THYROIDAB" in the last 72 hours. Anemia Panel: No results for input(s): "VITAMINB12", "FOLATE", "FERRITIN", "TIBC", "IRON", "RETICCTPCT" in the last 72 hours. Urine analysis:    Component Value Date/Time   COLORURINE YELLOW 09/09/2023 0211   APPEARANCEUR CLEAR 09/09/2023 0211   LABSPEC >1.046  (H) 09/09/2023 0211   PHURINE 6.0 09/09/2023 0211   GLUCOSEU NEGATIVE 09/09/2023 0211   HGBUR NEGATIVE 09/09/2023 0211   BILIRUBINUR NEGATIVE 09/09/2023 0211   BILIRUBINUR neg 05/10/2015 1429   KETONESUR 5 (A) 09/09/2023 0211   PROTEINUR NEGATIVE 09/09/2023 0211   UROBILINOGEN 0.2 07/05/2022 1433   NITRITE NEGATIVE 09/09/2023 0211   LEUKOCYTESUR NEGATIVE 09/09/2023 0211   Sepsis Labs: @LABRCNTIP (procalcitonin:4,lacticidven:4)  ) Recent Results (from the past 240 hours)  VZV PCR, CSF     Status: None   Collection Time: 09/09/23  5:29 AM   Specimen: Cerebrospinal Fluid  Result Value Ref Range Status   VZV PCR, CSF Negative Negative Final    Comment: (NOTE) No Varicella Zoster Virus DNA detected. Performed At: [facility] [address] [place], [place] [id number] Jolene Schimke MD AO:[id number]      Radiology Studies: MR BRAIN W WO CONTRAST Result Date: 09/12/2023 CLINICAL DATA:  Initial evaluation for acute ataxia. EXAM: MRI HEAD WITHOUT AND WITH CONTRAST TECHNIQUE: Multiplanar, multiecho pulse sequences of the brain and surrounding structures were obtained without and with intravenous contrast. CONTRAST:  4mL GADAVIST GADOBUTROL 1 MMOL/ML IV SOLN COMPARISON:  Prior  head CT from 09/09/2023 FINDINGS: Brain: Cerebral volume within normal limits for age. No focal parenchymal signal abnormality. No abnormal foci of restricted diffusion to suggest acute or subacute ischemia. Gray-white matter differentiation well maintained. No encephalomalacia to suggest chronic cortical infarction or other insult. No foci of susceptibility artifact indicative of acute or chronic intracranial blood products. No mass lesion, midline shift or mass effect. Ventricles normal in size and morphology without hydrocephalus. No extra-axial fluid collection. Pituitary gland and suprasellar region within normal limits. No abnormal enhancement. Vascular: Major intracranial vascular flow voids are well  maintained. Skull and upper cervical spine: Craniocervical junction within normal limits. Visualized upper cervical spine demonstrates no significant finding. Bone marrow signal intensity within normal limits. No scalp soft tissue abnormality. Sinuses/Orbits: Globes and orbital soft tissues are within normal limits. Paranasal sinuses are largely clear. No significant mastoid effusion. Other: None. IMPRESSION: Normal brain MRI. No acute intracranial abnormality or findings to explain patient's symptoms. Electronically Signed   By: Rise Mu M.D.   On: 09/12/2023 00:59     Scheduled Meds:  gabapentin  300 mg Oral TID   pantoprazole  40 mg Oral BID   senna-docusate  1 tablet Oral BID   sucralfate  1 g Oral TID WC & HS   Continuous Infusions:  methylPREDNISolone (SOLU-MEDROL) injection 66 mL/hr at 09/12/23 1300     LOS: 3 days    Time spent:    Berton Mount, MD [facility]   09/12/2023, 3:58 PM

## 2023-09-13 DIAGNOSIS — G373 Acute transverse myelitis in demyelinating disease of central nervous system: Secondary | ICD-10-CM | POA: Diagnosis not present

## 2023-09-13 MED ORDER — VITAMIN D 25 MCG (1000 UNIT) PO TABS
1000.0000 [IU] | ORAL_TABLET | Freq: Every day | ORAL | Status: DC
  Administered 2023-09-16: 1000 [IU] via ORAL
  Filled 2023-09-13 (×2): qty 1

## 2023-09-13 NOTE — Plan of Care (Signed)

## 2023-09-13 NOTE — Plan of Care (Signed)
  Problem: Activity: Goal: Risk for activity intolerance will decrease Outcome: Progressing   Problem: Nutrition: Goal: Adequate nutrition will be maintained Outcome: Progressing   Problem: Coping: Goal: Level of anxiety will decrease Outcome: Progressing   

## 2023-09-13 NOTE — Progress Notes (Signed)
PROGRESS NOTE    Stacy Kelley  TKZ:[id number] DOB: [birth year] DOA: 09/09/2023 PCP: Jordan Hawks, PA-C   Brief history: Patient is a 32 year old African-American female with past medical history significant for query SLE versus Sjogren syndrome.  Patient was seen by rheumatologist midlevel based out of [place], [place].  Other medical history includes skin rashes and hyperpigmentation, vitamin D deficiency, iron deficiency anemia, and STDs.  Patient was admitted with bilateral lower extremity numbness and pain X 8 days.  Noted to be hypokalemic, MRI C/T/L-spine revealed extensive spinal cord lesion that is suggestive of myelitis.  Patient will complete 5-day course of high-dose IV Solu-Medrol today.  Myelitis symptoms are improving, but not optimally.  Neurology is directing care.  Neurology plans to proceed with therapeutic plasma exchange, starting from tomorrow (see neurology note).  Abdominal ultrasound is negative for gallbladder disease.  Abdominal pain has resolved significantly.   Subjective: -Patient seen alongside patient's nurse, boyfriend and query sister.  Also came back to discuss with patient and patient's boyfriend and sister with the neurology team. -No new complaints. -No nausea. -No abdominal pain.    Assessment and Plan:  Acute transverse myelitis (HCC) -Likely autoimmune related to lupus versus viral -Neurology is directing care. -On high-dose IV Solu-Medrol (5-day course) to be completed today. -For therapeutic plasma exchange (as planned by neurology team) -Patient will follow-up with rheumatology on discharge. -Workup remains in progress. -MRI brain was negative.   -CSF largely unremarkable, oligoclonal bands   Hypokalemia: Replaced Last potassium was 4.1.     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   Iron deficiency anemia -Mild, monitor  Systemic lupus erythematosus (HCC) Sjogren's syndrome  (HCC) -Poorly compliant with Plaquenil -Complete course of high-dose IV Solu-Medrol today.   -Immediate follow-up with rheumatology on discharge.     Lower extremity/foot skin rash -?Vasculitis, could be related to lupus versus recent antibiotics -Resolving.  Abdominal pain/nausea: -Resolved with increased dose of PPI and Carafate. -Suspect secondary to high doses of IV steroids. -Abdominal ultrasound was negative for gallbladder disease.    Concerns for gallbladder disease: -Negative abdominal ultrasound.     DVT prophylaxis: SCDs Code Status: Full code Family Communication: None present Disposition Plan: Home pending improvement, steroids  Consultants: Neurology   Procedures:   Antimicrobials:    Objective: Vitals:   09/12/23 1933 09/13/23 0415 09/13/23 1411 09/13/23 1523  BP: (!) 138/95 117/74 (!) 134/94 118/84  Pulse: 70 67  72  Resp: 18 18 19    Temp: 98.7 F (37.1 C) 98.4 F (36.9 C)  98.2 F (36.8 C)  TempSrc: Oral Oral  Oral  SpO2: 100% 100%  98%  Weight:      Height:        Intake/Output Summary (Last 24 hours) at 09/13/2023 1803 Last data filed at 09/12/2023 1900 Gross per 24 hour  Intake 43.37 ml  Output --  Net 43.37 ml   Filed Weights   09/09/23 0006  Weight: 43.1 kg    Examination:  General exam: Awake and alert.  Patient is not in any distress.    Respiratory system: Clear to auscultation Cardiovascular system: S1 & S2   Abd: No tenderness.   Central nervous system: Awake and alert.  Power bilateral lower extremity 3+/5.   Extremities: no edema   Data Reviewed:   CBC: Recent Labs  Lab 09/09/23 0201 09/10/23 0325  WBC 5.9 8.1  NEUTROABS 3.6  --   HGB 11.1* 11.9*  HCT  33.5* 36.7  MCV 85.2 85.0  PLT 290 348   Basic Metabolic Panel: Recent Labs  Lab 09/09/23 0201 09/10/23 0325  NA 135 135  K 2.9* 4.1  CL 104 105  CO2 22 22  GLUCOSE 106* 111*  BUN 12 7  CREATININE 0.55 0.53  CALCIUM 9.7 9.9  MG 2.3  --   PHOS  3.0  --    GFR: Estimated Creatinine Clearance: 69.3 mL/min (by C-G formula based on SCr of 0.53 mg/dL). Liver Function Tests: Recent Labs  Lab 09/09/23 0201 09/09/23 0529 09/10/23 0325  AST 14*  --  12*  ALT 10  --  11  ALKPHOS 52  --  50  BILITOT 0.5  --  0.5  PROT 8.9*  --  8.8*  ALBUMIN 3.9 3.7* 3.9   Recent Labs  Lab 09/09/23 0201  LIPASE 25   No results for input(s): "AMMONIA" in the last 168 hours. Coagulation Profile: No results for input(s): "INR", "PROTIME" in the last 168 hours. Cardiac Enzymes: No results for input(s): "CKTOTAL", "CKMB", "CKMBINDEX", "TROPONINI" in the last 168 hours. BNP (last 3 results) No results for input(s): "PROBNP" in the last 8760 hours. HbA1C: No results for input(s): "HGBA1C" in the last 72 hours. CBG: No results for input(s): "GLUCAP" in the last 168 hours. Lipid Profile: No results for input(s): "CHOL", "HDL", "LDLCALC", "TRIG", "CHOLHDL", "LDLDIRECT" in the last 72 hours. Thyroid Function Tests: No results for input(s): "TSH", "T4TOTAL", "FREET4", "T3FREE", "THYROIDAB" in the last 72 hours. Anemia Panel: No results for input(s): "VITAMINB12", "FOLATE", "FERRITIN", "TIBC", "IRON", "RETICCTPCT" in the last 72 hours. Urine analysis:    Component Value Date/Time   COLORURINE YELLOW 09/09/2023 0211   APPEARANCEUR CLEAR 09/09/2023 0211   LABSPEC >1.046 (H) 09/09/2023 0211   PHURINE 6.0 09/09/2023 0211   GLUCOSEU NEGATIVE 09/09/2023 0211   HGBUR NEGATIVE 09/09/2023 0211   BILIRUBINUR NEGATIVE 09/09/2023 0211   BILIRUBINUR neg 05/10/2015 1429   KETONESUR 5 (A) 09/09/2023 0211   PROTEINUR NEGATIVE 09/09/2023 0211   UROBILINOGEN 0.2 07/05/2022 1433   NITRITE NEGATIVE 09/09/2023 0211   LEUKOCYTESUR NEGATIVE 09/09/2023 0211   Sepsis Labs: @LABRCNTIP (procalcitonin:4,lacticidven:4)  ) Recent Results (from the past 240 hours)  VZV PCR, CSF     Status: None   Collection Time: 09/09/23  5:29 AM   Specimen: Cerebrospinal Fluid   Result Value Ref Range Status   VZV PCR, CSF Negative Negative Final    Comment: (NOTE) No Varicella Zoster Virus DNA detected. Performed At: [facility] [address] [place], [place] [id number] Jolene Schimke MD NW:[id number]      Radiology Studies: US Abdomen Limited RUQ (LIVER/GB) Result Date: 09/12/2023 CLINICAL DATA:  Abdomen pain EXAM: ULTRASOUND ABDOMEN LIMITED RIGHT UPPER QUADRANT COMPARISON:  CT 09/09/2023 FINDINGS: Gallbladder: No gallstones or wall thickening visualized. No sonographic Murphy sign noted by sonographer. Common bile duct: Diameter: 3.3 mm Liver: No focal lesion identified. Within normal limits in parenchymal echogenicity. Portal vein is patent on color Doppler imaging with normal direction of blood flow towards the liver. Other: None. IMPRESSION: Negative examination. Electronically Signed   By: Jasmine Pang M.D.   On: 09/12/2023 16:05     Scheduled Meds:  [START ON 09/14/2023] cholecalciferol  1,000 Units Oral Daily   gabapentin  300 mg Oral TID   pantoprazole  40 mg Oral BID   senna-docusate  1 tablet Oral BID   sucralfate  1 g Oral TID WC & HS   Continuous Infusions:  LOS: 4 days    Time spent:    Berton Mount, MD [facility]   09/13/2023, 6:03 PM

## 2023-09-13 NOTE — Progress Notes (Signed)
NEUROLOGY CONSULT FOLLOW UP NOTE   Date of service: September 13, 2023 Patient Name: Stacy Kelley MRN:  [id number] DOB:  1990-12-23  Brief HPI  Stacy Kelley is a 32 y.o. female with pertinent PMH of SLE (diagnosed on 2023, not on treatment), vitamin B6 deficiency, genital herpes, substance use disorder who presented with sudden onset bilateral LE numbness, pain, and weakness 5 days prior that had continued to progress, now affecting lower torso through LE and with urinary frequency and incontinence. She has been taking prednisone OP (started 12/08) which has not provided any relief. 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 She was given solumedrol 1 g x1 and admitted for further work-up and treatment. Interval Hx/subjective   I discussed with her at length today, she had some minor improvement in her strength, but really continues to have very significant difficulties with both walking as well as sensation.  She has not seen much improvement in that respect.  Vitals   Vitals:   09/12/23 1933 09/13/23 0415 09/13/23 1411 09/13/23 1523  BP: (!) 138/95 117/74 (!) 134/94 118/84  Pulse: 70 67  72  Resp: 18 18 19    Temp: 98.7 F (37.1 C) 98.4 F (36.9 C)  98.2 F (36.8 C)  TempSrc: Oral Oral  Oral  SpO2: 100% 100%  98%  Weight:      Height:         Body mass index is 17.38 kg/m.  Physical Exam   Constitutional: Appears well-developed and well-nourished.   Neurologic Examination   Mental Status: Patient is awake, alert, oriented x3. No signs of aphasia or neglect. Cranial Nerves: II: Pupils equal, round, and reactive to light.   III,IV, VI: EOMI without ptosis or diploplia.  V: Facial sensation is symmetric to light touch and temperature. VII: Facial movement is symmetric.  VIII: Hearing is intact to voice X: Uvula elevates symmetrically XI: Shoulder shrug is symmetric. XII: Tongue is midline without atrophy or fasciculations.  Motor:  Normal effort thorughout, at least 5/5 bilateral UE, 5/5 bilateral LE.  Sensory: She has some ability to feel gross sensation in her left lower extremity, but does not feel me pinching her.  Medications  Current Facility-Administered Medications:    acetaminophen (TYLENOL) tablet 650 mg, 650 mg, Oral, Q6H PRN, 650 mg at 09/12/23 1223 **OR** acetaminophen (TYLENOL) suppository 650 mg, 650 mg, Rectal, Q6H PRN, Bobette Mo, MD   gabapentin (NEURONTIN) capsule 300 mg, 300 mg, Oral, TID, Berton Mount I, MD, 300 mg at 09/12/23 2012   ondansetron (ZOFRAN) tablet 4 mg, 4 mg, Oral, Q6H PRN **OR** ondansetron (ZOFRAN) injection 4 mg, 4 mg, Intravenous, Q6H PRN, Bobette Mo, MD, 4 mg at 09/10/23 1821   pantoprazole (PROTONIX) EC tablet 40 mg, 40 mg, Oral, BID, Berton Mount I, MD, 40 mg at 09/12/23 2012   senna-docusate (Senokot-S) tablet 1 tablet, 1 tablet, Oral, BID, Zannie Cove, MD, 1 tablet at 09/12/23 2012   sucralfate (CARAFATE) 1 GM/10ML suspension 1 g, 1 g, Oral, TID WC & HS, Berton Mount I, MD, 1 g at 09/12/23 2013 Labs and Diagnostic Imaging   CSF studies: RBC: 0 WBC: 2 Differential: Too few to count Glucose 40 Protein 52 CSF VDRL negative CSF VZV negative   IgG CSF index -IgG, CSF 11.3 -Albumin, CSF 23 -IgG, serum 2050 -Albumin 3.7 -IgG/Albumin ratio, CSF 0.49 -CSF IgG index 0.9  Serum: RPR negative T palladium negative Meningitis/encephalitis panel negative ESR elevated 85 SSB  antibody elevated >8.0 Vitamin D low 10.32 Anti-MOG negative CMV, RPR - negative HIV ab negative HTLV 1+2 ab -negative Serum ACE negative Serum RF negative  Pending studies: Oligoclonal bands Neuromyelitis optica autoantibody ANCA profile     CT Head without contrast: Normal head CT.  MRI cervical, thoracic, lumbar spine w/w/o contrast: Longitudinally extensive spinal cord lesion spanning T1-T9, history of lupus suggesting autoimmune cause.  MRI  brain Normal  Assessment   Stacy Kelley is a 32 y.o. female with past medical history significant for lupus(though unclear to me if dx is firm), vitamin B6 deficiency, genital herpes, gonorrhea, substance syndrome who presented 12/11 to the [facility], ED complaining of progressive bilateral leg weakness, numbness and pain x 5 days, with a sensory level at her lower thorax. MRI revealed a longitudinally extensive acute nonenhancing spinal cord lesion extending from the T1 to the T9 level.  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  Day 5/5 of solumedrol therapy for transverse myelitis.  She did have some mild response, but this has been a suboptimal response and she continues to be fairly significantly debilitated with a lot of trouble with her sensation in her gait.  Based on the small response to steroids, I would favor second line therapy and I discussed plasmapheresis with the patient.  I discussed risks and benefits with her and she agreed with proceeding.   Recommendations   - Follow remaining lab results - Solu-Medrol 1 g, did today is day five - Vitamin D supplementation given she was deficient -Therapeutic plasma exchange, I have discussed with critical care and the plan will be to place dialysis catheter in the morning with plan to start plasma exchange tomorrow.  I would favor on exchange tomorrow, as well as Tuesday, and then every other day for a total of five treatments. -Daily CBC, BMP, INR while on plasmapheresis   ______________________________________________________________________  Ritta Slot, MD [facility] [phone]  If 7pm- 7am, please page neurology on call as listed in AMION.

## 2023-09-13 NOTE — Progress Notes (Addendum)
Patient has declined IV steroid dose for 12 midday today. Patient and her sister stated that IV steroid is no longer working and will need to start "blood exchange procedure". They are asking to speak with the Dr who was in the room but don't have a name.   After discussion with attending doctor and neurologist,patient has agreed to get the IV steroid 5/5 dose

## 2023-09-14 ENCOUNTER — Encounter (HOSPITAL_COMMUNITY)
Admission: EM | Disposition: A | Discharge status: Rehabilitation Facility | Payer: Self-pay | Source: Home / Self Care | Attending: Internal Medicine

## 2023-09-14 DIAGNOSIS — G373 Acute transverse myelitis in demyelinating disease of central nervous system: Secondary | ICD-10-CM | POA: Diagnosis not present

## 2023-09-14 LAB — ANCA PROFILE
Anti-MPO Antibodies: <0.2 U (ref 0.0–0.9)
Anti-PR3 Antibodies: <0.2 U (ref 0.0–0.9)
Atypical P-ANCA titer: <1:20 {titer}
C-ANCA: <1:20 {titer}
P-ANCA: <1:20 {titer}

## 2023-09-14 LAB — CBC
HCT: 37.4 % (ref 36.0–46.0)
Hemoglobin: 12.1 g/dL (ref 12.0–15.0)
MCH: 27.3 pg (ref 26.0–34.0)
MCHC: 32.4 g/dL (ref 30.0–36.0)
MCV: 84.4 fL (ref 80.0–100.0)
Platelets: 384 10*3/uL (ref 150–400)
RBC: 4.43 MIL/uL (ref 3.87–5.11)
RDW: 14 % (ref 11.5–15.5)
WBC: 6.6 10*3/uL (ref 4.0–10.5)
nRBC: 0 % (ref 0.0–0.2)

## 2023-09-14 LAB — BASIC METABOLIC PANEL
Anion gap: 9 (ref 5–15)
BUN: 18 mg/dL (ref 6–20)
CO2: 21 mmol/L — ABNORMAL LOW (ref 22–32)
Calcium: 9.7 mg/dL (ref 8.9–10.3)
Chloride: 102 mmol/L (ref 98–111)
Creatinine, Ser: 0.72 mg/dL (ref 0.44–1.00)
GFR, Estimated: >60 mL/min (ref >60)
Glucose, Bld: 135 mg/dL — ABNORMAL HIGH (ref 70–99)
Potassium: 3.9 mmol/L (ref 3.5–5.1)
Sodium: 132 mmol/L — ABNORMAL LOW (ref 135–145)

## 2023-09-14 LAB — ENA+DNA/DS+ANTICH+CENTRO+JO...
Anti JO-1: <0.2 AI (ref 0.0–0.9)
Centromere Ab Screen: <0.2 AI (ref 0.0–0.9)
Chromatin Ab SerPl-aCnc: >8 AI — ABNORMAL HIGH (ref 0.0–0.9)
ENA SM Ab Ser-aCnc: 1.9 AI — ABNORMAL HIGH (ref 0.0–0.9)
Ribonucleic Protein: >8 AI — ABNORMAL HIGH (ref 0.0–0.9)
SSA (Ro) (ENA) Antibody, IgG: >8 AI — ABNORMAL HIGH (ref 0.0–0.9)
SSB (La) (ENA) Antibody, IgG: >8 AI — ABNORMAL HIGH (ref 0.0–0.9)
Scleroderma (Scl-70) (ENA) Antibody, IgG: <0.2 AI (ref 0.0–0.9)
ds DNA Ab: <1 [IU]/mL (ref 0–9)

## 2023-09-14 LAB — NEUROMYELITIS OPTICA AUTOAB, IGG: NMO-IgG: 2740.3 U/mL — ABNORMAL HIGH (ref 0.0–3.0)

## 2023-09-14 LAB — VITAMIN B12: Vitamin B-12: 470 pg/mL (ref 180–914)

## 2023-09-14 LAB — PROTIME-INR
INR: 1.1 (ref 0.8–1.2)
Prothrombin Time: 14.3 s (ref 11.4–15.2)

## 2023-09-14 LAB — ANA W/REFLEX IF POSITIVE: Anti Nuclear Antibody (ANA): POSITIVE — AB

## 2023-09-14 SURGERY — INSERTION OF DIALYSIS CATHETER
Anesthesia: LOCAL

## 2023-09-14 NOTE — Progress Notes (Signed)
NEUROLOGY CONSULT FOLLOW UP NOTE   Date of service: September 14, 2023 Patient Name: Stacy Kelley MRN:  [id number] DOB:  1990/11/03  Brief HPI  Stacy Kelley is a 32 y.o. female with pertinent PMH of SLE (diagnosed on 2023, not on treatment), vitamin B6 deficiency, genital herpes, substance use disorder who presented with sudden onset bilateral LE numbness, pain, and weakness 5 days prior that had continued to progress, now affecting lower torso through LE and with urinary frequency and incontinence. She has been taking prednisone OP (started 12/08) which has not provided any relief. 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 She was given solumedrol 1 g x1 and admitted for further work-up and treatment.  Interval Hx/subjective   Patient reports remarkable improvement overnight. Is able to sense when she needs to urinate and pain and sensory deficits are improved. She is 5/5 strength throughout and walked well with PT.  Vitals   Vitals:   09/13/23 2024 09/14/23 0535 09/14/23 0812 09/14/23 1508  BP: (!) 134/90  (!) 133/101 (!) 122/93  Pulse: 62 74 62 95  Resp:   16   Temp: 97.9 F (36.6 C) 98.2 F (36.8 C) 98.7 F (37.1 C) 98.4 F (36.9 C)  TempSrc: Oral Oral Oral Oral  SpO2: 100% 100% 100% 100%  Weight:      Height:         Body mass index is 17.38 kg/m.  Physical Exam   Constitutional: Appears well-developed and well-nourished.   Neurologic Examination   Mental Status: Patient is awake, alert, oriented x3. No signs of aphasia or neglect. Cranial Nerves: II: Pupils equal, round, and reactive to light.   III,IV, VI: EOMI without ptosis or diploplia.  V: Facial sensation is symmetric to light touch and temperature. VII: Facial movement is symmetric.  VIII: Hearing is intact to voice X: Uvula elevates symmetrically XI: Shoulder shrug is symmetric. XII: Tongue is midline without atrophy or fasciculations.  Motor: Normal effort  thorughout, 5/5 bilateral UE, 5/5 bilateral LE.  Sensory: LLE sensory deficit improved but still present  Medications  Current Facility-Administered Medications:    acetaminophen (TYLENOL) tablet 650 mg, 650 mg, Oral, Q6H PRN, 650 mg at 09/12/23 1223 **OR** acetaminophen (TYLENOL) suppository 650 mg, 650 mg, Rectal, Q6H PRN, Bobette Mo, MD   cholecalciferol (VITAMIN D3) 25 MCG (1000 UNIT) tablet 1,000 Units, 1,000 Units, Oral, Daily, Amada Jupiter, Hardin Negus, MD   gabapentin (NEURONTIN) capsule 300 mg, 300 mg, Oral, TID, Berton Mount I, MD, 300 mg at 09/12/23 2012   ondansetron (ZOFRAN) tablet 4 mg, 4 mg, Oral, Q6H PRN **OR** ondansetron (ZOFRAN) injection 4 mg, 4 mg, Intravenous, Q6H PRN, Bobette Mo, MD, 4 mg at 09/10/23 1821   pantoprazole (PROTONIX) EC tablet 40 mg, 40 mg, Oral, BID, Berton Mount I, MD, 40 mg at 09/12/23 2012   senna-docusate (Senokot-S) tablet 1 tablet, 1 tablet, Oral, BID, Zannie Cove, MD, 1 tablet at 09/13/23 2054   sucralfate (CARAFATE) 1 GM/10ML suspension 1 g, 1 g, Oral, TID WC & HS, Berton Mount I, MD, 1 g at 09/12/23 2013 Labs and Diagnostic Imaging   CSF studies: RBC: 0 WBC: 2 Differential: Too few to count Glucose 40 Protein 52 CSF VDRL negative CSF VZV negative   IgG CSF index -IgG, CSF 11.3 -Albumin, CSF 23 -IgG, serum 2050 -Albumin 3.7 -IgG/Albumin ratio, CSF 0.49 -CSF IgG index 0.9  Serum: RPR negative T palladium negative Meningitis/encephalitis panel negative ESR elevated 85  SSB antibody elevated >8.0 Vitamin D low 10.32 Anti-MOG negative CMV, RPR - negative HIV ab negative HTLV 1+2 ab -negative Serum ACE negative Serum RF negative  Pending studies: Oligoclonal bands Neuromyelitis optica autoantibody ANCA profile     CT Head without contrast: Normal head CT.  MRI cervical, thoracic, lumbar spine w/w/o contrast: Longitudinally extensive spinal cord lesion spanning T1-T9, history of lupus  suggesting autoimmune cause.  MRI brain Normal  Assessment   Stacy Kelley is a 32 y.o. female with past medical history significant for lupus(though unclear to me if dx is firm), vitamin B6 deficiency, genital herpes, gonorrhea, substance syndrome who presented 12/11 to the [facility], ED complaining of progressive bilateral leg weakness, numbness and pain x 5 days, with a sensory level at her lower thorax. MRI revealed a longitudinally extensive acute nonenhancing spinal cord lesion extending from the T1 to the T9 level.  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 She completed 5 days of solumedrol on 12/15. She had initially plateaued during steroid treatment so plan was to start PLEX today but she reports remarkable improvement overnight and was able to walk well with PT today.  Recommendations   - Defer PLEX for now, continue to observe for improvement - PT/OT - Vitamin D supplementation given she was deficient - Will continue to follow  Bing Neighbors, MD [facility] [phone]  If 7pm- 7am, please page neurology on call as listed in AMION.

## 2023-09-14 NOTE — Plan of Care (Signed)

## 2023-09-14 NOTE — Progress Notes (Signed)
Physical Therapy Treatment Patient Details Name: Stacy Kelley MRN: [id number] DOB: [birth year] Today's Date: 09/14/2023   History of Present Illness 32 y.o. female presents to [facility] hospital on 09/09/2023 with BLE weakness, numbness, pain, incontinence. 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 PMH includes SLE, genital herpes, substance use disorder, lupus    PT Comments  Pt reports marked improvement in strength and mobility in comparison to yesterday. Pt with 5/5 BLE strength, continues with left lower extremity sensation impairment. Pt able to perform Rhomberg balance test eyes open and closed, but unable to perform tandem without external support. Pt taking 30.04s on 5 Times Sit to Stand test, indicating she is at higher risk of falls. Ambulating limited hallway distance with no assistive device and minA for balance. Worked on multi directional stepping, stretching and lunges. Pt highly motivated. Patient will benefit from intensive inpatient follow up therapy, >3 hours/day.    If plan is discharge home, recommend the following: Assistance with cooking/housework;Assist for transportation;Help with stairs or ramp for entrance;A little help with walking and/or transfers;A little help with bathing/dressing/bathroom   Can travel by private vehicle        Equipment Recommendations  Other (comment) (TBA (pt progressing quickly))    Recommendations for Other Services       Precautions / Restrictions Precautions Precautions: Fall Restrictions Weight Bearing Restrictions Per Provider Order: No     Mobility  Bed Mobility               General bed mobility comments: OOB in chair upon entry    Transfers Overall transfer level: Needs assistance Equipment used: None Transfers: Sit to/from Stand Sit to Stand: Contact guard assist                Ambulation/Gait Ambulation/Gait assistance: Min assist Gait Distance (Feet): 40 Feet Assistive device: None, 1 person hand held  assist Gait Pattern/deviations: Step-through pattern, Decreased stride length Gait velocity: decreased Gait velocity interpretation: <1.8 ft/sec, indicate of risk for recurrent falls   General Gait Details: Slow, hesitant steps with intermittent single HHA for increased stability.   Stairs             Wheelchair Mobility     Tilt Bed    Modified Rankin (Stroke Patients Only)       Balance Overall balance assessment: Needs assistance Sitting-balance support: No upper extremity supported, Feet supported Sitting balance-Leahy Scale: Good     Standing balance support: No upper extremity supported, During functional activity Standing balance-Leahy Scale: Fair       Tandem Stance - Right Leg: 0 Tandem Stance - Left Leg: 0 Rhomberg - Eyes Opened: 10 Rhomberg - Eyes Closed: 10                Cognition Arousal: Alert Behavior During Therapy: WFL for tasks assessed/performed Overall Cognitive Status: Within Functional Limits for tasks assessed                                          Exercises Other Exercises Other Exercises: Standing with light hand support on railing: lateral stepping to R/L x 10 ft in each directions x 2 sets. Bilateral gastroc "runner's" stretch x 1 minute each. Bilateral mini lunges x 3    General Comments        Pertinent Vitals/Pain Pain Assessment Pain Assessment: Faces Faces Pain Scale: Hurts a little bit Pain Location:  back Pain Descriptors / Indicators: Sore Pain Intervention(s): Monitored during session    Home Living                          Prior Function            PT Goals (current goals can now be found in the care plan section) Acute Rehab PT Goals Patient Stated Goal: to return to independence Potential to Achieve Goals: Good Progress towards PT goals: Progressing toward goals    Frequency    Min 1X/week      PT Plan      Co-evaluation              AM-PAC PT "6  Clicks" Mobility   Outcome Measure  Help needed turning from your back to your side while in a flat bed without using bedrails?: None Help needed moving from lying on your back to sitting on the side of a flat bed without using bedrails?: None Help needed moving to and from a bed to a chair (including a wheelchair)?: A Little Help needed standing up from a chair using your arms (e.g., wheelchair or bedside chair)?: A Little Help needed to walk in hospital room?: A Little Help needed climbing 3-5 steps with a railing? : A Little 6 Click Score: 20    End of Session Equipment Utilized During Treatment: Gait belt Activity Tolerance: Patient tolerated treatment well Patient left: in chair;with call bell/phone within reach;with family/visitor present Nurse Communication: Mobility status PT Visit Diagnosis: Other abnormalities of gait and mobility (R26.89);Muscle weakness (generalized) (M62.81);Other symptoms and signs involving the nervous system (R29.898)     Time: 8841-6606 PT Time Calculation (min) (ACUTE ONLY): 22 min  Charges:    $Therapeutic Activity: 8-22 mins PT General Charges $$ ACUTE PT VISIT: 1 Visit                     .Lillia Pauls, PT, DPT Acute Rehabilitation Services Office [phone]    Norval Morton 09/14/2023, 1:16 PM

## 2023-09-14 NOTE — Progress Notes (Signed)
Occupational Therapy Treatment Patient Details Name: Stacy Kelley MRN: [id number] DOB: November 29, 1990 Today's Date: 09/14/2023   History of present illness 32 y.o. female presents to [facility] hospital on 09/09/2023 with BLE weakness, numbness, pain, incontinence. 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 PMH includes SLE, genital herpes, substance use disorder, lupus   OT comments  Patient seated on EOB and states she has less pain and is more mobile today. Patient was min assist for LB dressing seated on EOB and standing to pull up clothing. Patient ambulating in room with HHA to sink and stood at sink for grooming tasks. Further mobility performed with HHA and CGA. Patient will benefit from intensive inpatient follow up therapy, >3 hours/day to continue to address self care and functional transfers. Acute OT to continue to follow.       If plan is discharge home, recommend the following:  A little help with walking and/or transfers;A little help with bathing/dressing/bathroom;Assistance with cooking/housework;Help with stairs or ramp for entrance;Assist for transportation   Equipment Recommendations  Tub/shower bench;BSC/3in1    Recommendations for Other Services Rehab consult    Precautions / Restrictions Precautions Precautions: Fall Restrictions Weight Bearing Restrictions Per Provider Order: No       Mobility Bed Mobility Overal bed mobility: Needs Assistance             General bed mobility comments: seated on EOB upon entry and in recliner at end of session    Transfers Overall transfer level: Needs assistance Equipment used: None, 1 person hand held assist Transfers: Sit to/from Stand Sit to Stand: Contact guard assist     Step pivot transfers: Contact guard assist     General transfer comment: 1 person HHA for mobility and transfers in room     Balance Overall balance assessment: Needs assistance Sitting-balance support: No upper extremity supported, Feet  supported Sitting balance-Leahy Scale: Fair Sitting balance - Comments: seated on EOB upon entry   Standing balance support: During functional activity, Single extremity supported Standing balance-Leahy Scale: Fair Standing balance comment: stood at sink for grooming tasks and able to stand without UE support while washing hands and pulling up clothing.                           ADL either performed or assessed with clinical judgement   ADL Overall ADL's : Needs assistance/impaired     Grooming: Wash/dry hands;Wash/dry face;Oral care;Supervision/safety;Standing Grooming Details (indicate cue type and reason): at sink         Upper Body Dressing : Set up;Sitting Upper Body Dressing Details (indicate cue type and reason): gown to cover back Lower Body Dressing: Contact guard assist;Sit to/from stand                      Extremity/Trunk Assessment              Vision       Perception     Praxis      Cognition Arousal: Alert Behavior During Therapy: Flat affect Overall Cognitive Status: Within Functional Limits for tasks assessed                                 General Comments: pleased to be more mobile and less pain        Exercises      Shoulder Instructions       General  Comments      Pertinent Vitals/ Pain       Pain Assessment Pain Assessment: Faces Faces Pain Scale: Hurts a little bit Pain Location: back Pain Descriptors / Indicators: Grimacing Pain Intervention(s): Monitored during session, Repositioned  Home Living                                          Prior Functioning/Environment              Frequency  Min 1X/week        Progress Toward Goals  OT Goals(current goals can now be found in the care plan section)  Progress towards OT goals: Progressing toward goals  Acute Rehab OT Goals Patient Stated Goal: go home to see children OT Goal Formulation: With patient Time  For Goal Achievement: 09/26/23 Potential to Achieve Goals: Good ADL Goals Pt Will Perform Grooming: with modified independence;sitting;standing Pt Will Perform Upper Body Bathing: with modified independence;sitting Pt Will Perform Lower Body Bathing: with modified independence;sit to/from stand Pt Will Perform Upper Body Dressing: with modified independence;sitting Pt Will Perform Lower Body Dressing: with modified independence;sit to/from stand Pt Will Transfer to Toilet: with modified independence;ambulating;bedside commode Pt Will Perform Toileting - Clothing Manipulation and hygiene: with modified independence;sit to/from stand Pt Will Perform Tub/Shower Transfer: Tub transfer;with modified independence;ambulating;tub bench;rolling walker Additional ADL Goal #1: pt will be Mod I in and OOB for basic ADLs (increased time, HOB flat, no rail  Plan      Co-evaluation                 AM-PAC OT "6 Clicks" Daily Activity     Outcome Measure   Help from another person eating meals?: None Help from another person taking care of personal grooming?: A Little Help from another person toileting, which includes using toliet, bedpan, or urinal?: A Little Help from another person bathing (including washing, rinsing, drying)?: A Little Help from another person to put on and taking off regular upper body clothing?: A Little Help from another person to put on and taking off regular lower body clothing?: A Little 6 Click Score: 19    End of Session Equipment Utilized During Treatment: Gait belt  OT Visit Diagnosis: Unsteadiness on feet (R26.81);Other abnormalities of gait and mobility (R26.89);Muscle weakness (generalized) (M62.81);Pain Pain - part of body:  (back)   Activity Tolerance Patient tolerated treatment well   Patient Left in chair;Other (comment) (left with PT)   Nurse Communication Mobility status        Time: 3295-1884 OT Time Calculation (min): 32 min  Charges: OT  General Charges $OT Visit: 1 Visit OT Treatments $Self Care/Home Management : 8-22 mins $Therapeutic Activity: 8-22 mins  Alfonse Flavors, OTA Acute Rehabilitation Services  Office [phone]   Dewain Penning 09/14/2023, 1:02 PM

## 2023-09-14 NOTE — Progress Notes (Signed)
Inpatient Rehab Admissions Coordinator:  Saw pt at bedside. Awaiting medical clearance for potential CIR admission. Will continue to follow.  Wolfgang Phoenix, MS, CCC-SLP Admissions Coordinator [phone]

## 2023-09-14 NOTE — Progress Notes (Signed)
[facility] PROGRESS NOTE    Progress Note  Stacy Kelley  UEA:[id number] DOB: September 06, 1991 DOA: 09/09/2023 PCP: Jordan Hawks, PA-C     Brief Narrative:   Stacy Kelley is an 32 y.o. 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 Neurology was consulted completed 5-day course of steroids with minimal improvement. Patient is up and walking today going to the bathroom able to get up from the bed without assistance.  She relates she is close to baseline CSF was unremarkable.  Hypokalemia: Repleted orally now improved.  Vitamin D deficiency: Continue oral supplementation.  Iron deficiency anemia Continue iron supplementation.  Systemic lupus erythematosus (HCC) Poorly compliant with her medication Plaquenil as an outpatient. She received 5-day course of steroids follow-up with rheumatology as an outpatient.  Skin rash: Resolving.  Abdominal pain nausea and vomiting: Suspect due to high-dose steroid started on PPI and Carafate. Abdominal ultrasound was unremarkable.  DVT prophylaxis: lovenox Family Communication:none Status is: Inpatient Remains inpatient appropriate because: Acute transverse myelitis    Code Status:     Code Status Orders  (From admission, onward)           Start     Ordered   09/09/23 0937  Full code  Continuous       Question:  By:  Answer:  Consent: discussion documented in EHR   09/09/23 0938           Code Status History     Date Active  Date Inactive Code Status Order ID Comments User Context   05/11/2015 2055 05/13/2015 1738 Full Code [id number]  Roe Coombs, CNM Inpatient   05/11/2015 1528 05/11/2015 2055 Full Code [id number]  Roe Coombs, CNM Inpatient         IV Access:   Peripheral IV   Procedures and diagnostic studies:   US Abdomen Limited RUQ (LIVER/GB) Result Date: 09/12/2023 CLINICAL DATA:  Abdomen pain EXAM: ULTRASOUND ABDOMEN LIMITED RIGHT UPPER QUADRANT COMPARISON:  CT 09/09/2023 FINDINGS: Gallbladder: No gallstones or wall thickening visualized. No sonographic Murphy sign noted by sonographer. Common bile duct: Diameter: 3.3 mm Liver: No focal lesion identified. Within normal limits in parenchymal echogenicity. Portal vein is patent on color Doppler imaging with normal direction of blood flow towards the liver. Other: None. IMPRESSION: Negative examination. Electronically Signed   By: Jasmine Pang M.D.   On: 09/12/2023 16:05     Medical Consultants:   None.   Subjective:    Stacy Kelley she will there is a miracle, she feels back to baseline  Objective:    Vitals:   09/13/23 1523 09/13/23 2024 09/14/23 0535 09/14/23 0812  BP: 118/84 (!) 134/90  (!) 133/101  Pulse: 72 62 74 62  Resp:    16  Temp: 98.2 F (36.8 C) 97.9 F (36.6 C) 98.2 F (36.8 C) 98.7 F (37.1 C)  TempSrc: Oral Oral Oral Oral  SpO2: 98% 100% 100% 100%  Weight:      Height:       SpO2: 100 %  No intake or output data in the  24 hours ending 09/14/23 0940 Filed Weights   09/09/23 0006  Weight: 43.1 kg    Exam: General exam: In no acute distress. Respiratory system: Good air movement and clear to auscultation. Cardiovascular system: S1 & S2 heard, RRR. No JVD. Gastrointestinal system: Abdomen is nondistended, soft and nontender.  Extremities: No pedal edema. Skin: No rashes, lesions or ulcers Psychiatry: Judgement and insight appear normal. Mood & affect appropriate.    Data Reviewed:     Labs: Basic Metabolic Panel: Recent Labs  Lab 09/09/23 0201 09/10/23 0325 09/14/23 0432  NA 135 135 132*  K 2.9* 4.1 3.9  CL 104 105 102  CO2 22 22 21*  GLUCOSE 106* 111* 135*  BUN 12 7 18   CREATININE 0.55 0.53 0.72  CALCIUM 9.7 9.9 9.7  MG 2.3  --   --   PHOS 3.0  --   --    GFR Estimated Creatinine Clearance: 69.3 mL/min (by C-G formula based on SCr of 0.72 mg/dL). Liver Function Tests: Recent Labs  Lab 09/09/23 0201 09/09/23 0529 09/10/23 0325  AST 14*  --  12*  ALT 10  --  11  ALKPHOS 52  --  50  BILITOT 0.5  --  0.5  PROT 8.9*  --  8.8*  ALBUMIN 3.9 3.7* 3.9   Recent Labs  Lab 09/09/23 0201  LIPASE 25   No results for input(s): "AMMONIA" in the last 168 hours. Coagulation profile Recent Labs  Lab 09/14/23 0548  INR 1.1   COVID-19 Labs  No results for input(s): "DDIMER", "FERRITIN", "LDH", "CRP" in the last 72 hours.  Lab Results  Component Value Date   SARSCOV2NAA NEGATIVE 04/10/2020    CBC: Recent Labs  Lab 09/09/23 0201 09/10/23 0325 09/14/23 0432  WBC 5.9 8.1 6.6  NEUTROABS 3.6  --   --   HGB 11.1* 11.9* 12.1  HCT 33.5* 36.7 37.4  MCV 85.2 85.0 84.4  PLT 290 348 384   Cardiac Enzymes: No results for input(s): "CKTOTAL", "CKMB", "CKMBINDEX", "TROPONINI" in the last 168 hours. BNP (last 3 results) No results for input(s): "PROBNP" in the last 8760 hours. CBG: No results for input(s): "GLUCAP" in the last 168 hours. D-Dimer: No results for input(s): "DDIMER" in the last 72 hours. Hgb A1c: No results for input(s): "HGBA1C" in the last 72 hours. Lipid Profile: No results for input(s): "CHOL", "HDL", "LDLCALC", "TRIG", "CHOLHDL", "LDLDIRECT" in the last 72 hours. Thyroid function studies: No results for input(s): "TSH", "T4TOTAL", "T3FREE", "THYROIDAB" in the last 72 hours.  Invalid input(s): "FREET3" Anemia work up: Recent Labs    09/14/23 0432  VITAMINB12 470   Sepsis Labs: Recent Labs  Lab 09/09/23 0201  09/10/23 0325 09/14/23 0432  WBC 5.9 8.1 6.6   Microbiology Recent Results (from the past 240 hours)  VZV PCR, CSF     Status: None   Collection Time: 09/09/23  5:29 AM   Specimen: Cerebrospinal Fluid  Result Value Ref Range Status   VZV PCR, CSF Negative Negative Final    Comment: (NOTE) No Varicella Zoster Virus DNA detected. Performed At: [facility] [address] [place], [place] [id number] Jolene Schimke MD RJ:[id number]      Medications:    cholecalciferol  1,000 Units Oral Daily   gabapentin  300 mg Oral TID   pantoprazole  40 mg Oral BID   senna-docusate  1 tablet Oral BID   sucralfate  1 g Oral TID WC & HS   Continuous Infusions:  LOS: 5 days   Marinda Elk  [facility]  09/14/2023, 9:40 AM

## 2023-09-14 NOTE — Progress Notes (Signed)
PCCM Brief Note  Asked to assist with HD cath placement for PLEX. Apparently neuro and primary team had discussed deferring PLEX for now so pt had refused transport to endoscopy for catheter placement this afternoon.  I called Dr Selina Cooley with neurology to verify that we are in fact holding off on PLEX for now which she confirmed.  Procedure cancelled.    Stacy Guys, PA - C [facility] For pager details, please see AMION or use Epic chat  After 1900, please call [facility] for cross coverage needs 09/14/2023, 1:48 PM

## 2023-09-15 DIAGNOSIS — G373 Acute transverse myelitis in demyelinating disease of central nervous system: Secondary | ICD-10-CM | POA: Diagnosis not present

## 2023-09-15 DIAGNOSIS — G0491 Myelitis, unspecified: Secondary | ICD-10-CM | POA: Diagnosis not present

## 2023-09-15 DIAGNOSIS — E876 Hypokalemia: Secondary | ICD-10-CM

## 2023-09-15 LAB — BASIC METABOLIC PANEL
Anion gap: 8 (ref 5–15)
BUN: 17 mg/dL (ref 6–20)
CO2: 24 mmol/L (ref 22–32)
Calcium: 9.5 mg/dL (ref 8.9–10.3)
Chloride: 102 mmol/L (ref 98–111)
Creatinine, Ser: 0.67 mg/dL (ref 0.44–1.00)
GFR, Estimated: >60 mL/min (ref >60)
Glucose, Bld: 92 mg/dL (ref 70–99)
Potassium: 3.1 mmol/L — ABNORMAL LOW (ref 3.5–5.1)
Sodium: 134 mmol/L — ABNORMAL LOW (ref 135–145)

## 2023-09-15 LAB — CBC
HCT: 37.5 % (ref 36.0–46.0)
Hemoglobin: 12.2 g/dL (ref 12.0–15.0)
MCH: 27.8 pg (ref 26.0–34.0)
MCHC: 32.5 g/dL (ref 30.0–36.0)
MCV: 85.4 fL (ref 80.0–100.0)
Platelets: 365 10*3/uL (ref 150–400)
RBC: 4.39 MIL/uL (ref 3.87–5.11)
RDW: 14 % (ref 11.5–15.5)
WBC: 7 10*3/uL (ref 4.0–10.5)
nRBC: 0 % (ref 0.0–0.2)

## 2023-09-15 LAB — PROTIME-INR
INR: 1.1 (ref 0.8–1.2)
Prothrombin Time: 14.4 s (ref 11.4–15.2)

## 2023-09-15 MED ORDER — FERROUS GLUCONATE 324 (38 FE) MG PO TABS
324.0000 mg | ORAL_TABLET | Freq: Every day | ORAL | Status: DC
  Filled 2023-09-15 (×3): qty 1

## 2023-09-15 MED ORDER — POTASSIUM CHLORIDE CRYS ER 20 MEQ PO TBCR
40.0000 meq | EXTENDED_RELEASE_TABLET | Freq: Two times a day (BID) | ORAL | Status: AC
  Administered 2023-09-15: 20 meq via ORAL
  Filled 2023-09-15 (×2): qty 2

## 2023-09-15 MED ORDER — DOCUSATE SODIUM 100 MG PO CAPS: 100.0000 mg | ORAL_CAPSULE | Freq: Two times a day (BID) | ORAL | Status: DC | PRN

## 2023-09-15 MED ORDER — HYDROXYCHLOROQUINE SULFATE 200 MG PO TABS
200.0000 mg | ORAL_TABLET | Freq: Every day | ORAL | Status: DC
  Administered 2023-09-15 – 2023-09-16 (×2): 200 mg via ORAL
  Filled 2023-09-15 (×2): qty 1

## 2023-09-15 MED ORDER — ENSURE ENLIVE PO LIQD
237.0000 mL | Freq: Two times a day (BID) | ORAL | Status: DC
  Administered 2023-09-16 (×2): 237 mL via ORAL

## 2023-09-15 MED ORDER — SULFAMETHOXAZOLE-TRIMETHOPRIM 800-160 MG PO TABS
1.0000 | ORAL_TABLET | Freq: Two times a day (BID) | ORAL | Status: DC
  Administered 2023-09-15 – 2023-09-16 (×2): 1 via ORAL
  Filled 2023-09-15 (×3): qty 1

## 2023-09-15 NOTE — Plan of Care (Signed)
  Problem: Education: Goal: Knowledge of General Education information will improve Description: Including pain rating scale, medication(s)/side effects and non-pharmacologic comfort measures Outcome: Progressing   Problem: Health Behavior/Discharge Planning: Goal: Ability to manage health-related needs will improve Outcome: Progressing   Problem: Activity: Goal: Risk for activity intolerance will decrease Outcome: Progressing   Problem: Pain Management: Goal: General experience of comfort will improve Outcome: Progressing   Problem: Skin Integrity: Goal: Risk for impaired skin integrity will decrease Outcome: Progressing

## 2023-09-15 NOTE — Progress Notes (Signed)
Physical Therapy Treatment Patient Details Name: Stacy Kelley MRN: [id number] DOB: May 10, 1991 Today's Date: 09/15/2023   History of Present Illness 32 y.o. female presents to [facility] hospital on 09/09/2023 with BLE weakness, numbness, pain, incontinence. 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 PMH includes SLE, genital herpes, substance use disorder, lupus    PT Comments  Pt reports increased back soreness today, however, is agreeable and motivated to participate in therapy session. Strength continues to steadily improve, however, pt with continued left lower extremity numbness and sensation impairment. Session focused on gait training and strengthening. Pt requiring up to min assist for gait, demonstrating decreased bilateral foot clearance, gait speed and step length for age. Able to carry over well with cueing for technique. Pt ambulating ~60 ft with handheld assist. Presents as a high fall risk based on deficits listed above and 5 Times Sit to Stand test as noted in note from yesterday. Highly recommend intensive inpatient follow up therapy, >3 hours/day in order to address deficits and maximize functional mobility.     If plan is discharge home, recommend the following: Assistance with cooking/housework;Assist for transportation;Help with stairs or ramp for entrance;A little help with walking and/or transfers;A little help with bathing/dressing/bathroom   Can travel by private vehicle        Equipment Recommendations  None recommended by PT    Recommendations for Other Services       Precautions / Restrictions Precautions Precautions: Fall Restrictions Weight Bearing Restrictions Per Provider Order: No     Mobility  Bed Mobility               General bed mobility comments: OOB in chair upon entry    Transfers Overall transfer level: Needs assistance Equipment used: None Transfers: Sit to/from Stand Sit to Stand: Contact guard assist                 Ambulation/Gait Ambulation/Gait assistance: Min assist Gait Distance (Feet): 80 Feet Assistive device: None, 1 person hand held assist Gait Pattern/deviations: Step-through pattern, Decreased stride length Gait velocity: decreased Gait velocity interpretation: <1.8 ft/sec, indicate of risk for recurrent falls   General Gait Details: Slow, hesitant steps with intermittent single HHA for increased stability. Verbal cues for larger step lengths and increased foot clearance.   Stairs             Wheelchair Mobility     Tilt Bed    Modified Rankin (Stroke Patients Only)       Balance Overall balance assessment: Needs assistance Sitting-balance support: No upper extremity supported, Feet supported Sitting balance-Leahy Scale: Good     Standing balance support: No upper extremity supported, During functional activity Standing balance-Leahy Scale: Fair       Tandem Stance - Right Leg: 0 Tandem Stance - Left Leg: 0 Rhomberg - Eyes Opened: 10 Rhomberg - Eyes Closed: 10                Cognition Arousal: Alert Behavior During Therapy: WFL for tasks assessed/performed Overall Cognitive Status: Within Functional Limits for tasks assessed                                          Exercises Other Exercises Other Exercises: Bilateral step ups x  5 eachs, lateral step ups x 5 each    General Comments        Pertinent Vitals/Pain Pain Assessment Pain  Assessment: Faces Faces Pain Scale: Hurts little more Pain Location: back Pain Descriptors / Indicators: Sore Pain Intervention(s): Monitored during session    Home Living                          Prior Function            PT Goals (current goals can now be found in the care plan section) Acute Rehab PT Goals Patient Stated Goal: to return to independence Potential to Achieve Goals: Good Progress towards PT goals: Progressing toward goals    Frequency    Min  1X/week      PT Plan      Co-evaluation              AM-PAC PT "6 Clicks" Mobility   Outcome Measure  Help needed turning from your back to your side while in a flat bed without using bedrails?: None Help needed moving from lying on your back to sitting on the side of a flat bed without using bedrails?: None Help needed moving to and from a bed to a chair (including a wheelchair)?: A Little Help needed standing up from a chair using your arms (e.g., wheelchair or bedside chair)?: A Little Help needed to walk in hospital room?: A Little Help needed climbing 3-5 steps with a railing? : A Little 6 Click Score: 20    End of Session Equipment Utilized During Treatment: Gait belt Activity Tolerance: Patient tolerated treatment well Patient left: in chair;with call bell/phone within reach;with family/visitor present Nurse Communication: Mobility status PT Visit Diagnosis: Other abnormalities of gait and mobility (R26.89);Muscle weakness (generalized) (M62.81);Other symptoms and signs involving the nervous system (R29.898)     Time: 9562-1308 PT Time Calculation (min) (ACUTE ONLY): 24 min  Charges:    $Gait Training: 8-22 mins $Therapeutic Activity: 8-22 mins PT General Charges $$ ACUTE PT VISIT: 1 Visit                     Lillia Pauls, PT, DPT Acute Rehabilitation Services Office [phone]    Norval Morton 09/15/2023, 11:47 AM

## 2023-09-15 NOTE — Progress Notes (Signed)
Pt refused her meds tonight. Patient c/o of new rashes on her foot and believes to be related to the antibiotics she took.

## 2023-09-15 NOTE — Progress Notes (Signed)
NEUROLOGY CONSULT FOLLOW UP NOTE   Date of service: September 15, 2023 Patient Name: Stacy Kelley MRN:  [id number] DOB:  November 09, 1990  Brief HPI  Stacy Kelley is a 32 y.o. female with pertinent PMH of SLE (diagnosed on 2023, not on treatment), vitamin B6 deficiency, genital herpes, substance use disorder who presented with sudden onset bilateral LE numbness, pain, and weakness 5 days prior that had continued to progress, now affecting lower torso through LE and with urinary frequency and incontinence. She has been taking prednisone OP (started 12/08) which has not provided any relief. 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 She was given solumedrol 1 g x1 and admitted for further work-up and treatment.  Interval Hx/subjective   Stable exam from yesterday. She is 5/5 strength throughout and walked well with PT.  Vitals   Vitals:   09/15/23 0540 09/15/23 0612 09/15/23 0613 09/15/23 0622  BP:    117/85  Pulse:    86  Resp: (!) 21 (!) 8 (!) 8   Temp:    98.2 F (36.8 C)  TempSrc:    Oral  SpO2:    100%  Weight:      Height:         Body mass index is 17.38 kg/m.  Physical Exam   Constitutional: Appears well-developed and well-nourished.   Neurologic Examination   Mental Status: Patient is awake, alert, oriented x3. No signs of aphasia or neglect. Cranial Nerves: II: Pupils equal, round, and reactive to light.   III,IV, VI: EOMI without ptosis or diploplia.  V: Facial sensation is symmetric to light touch and temperature. VII: Facial movement is symmetric.  VIII: Hearing is intact to voice X: Uvula elevates symmetrically XI: Shoulder shrug is symmetric. XII: Tongue is midline without atrophy or fasciculations.  Motor: Normal effort thorughout, 5/5 bilateral UE, 5/5 bilateral LE.  Sensory: LLE sensory deficit improved but still present  Medications  Current Facility-Administered Medications:    acetaminophen (TYLENOL) tablet 650  mg, 650 mg, Oral, Q6H PRN, 650 mg at 09/14/23 1528 **OR** acetaminophen (TYLENOL) suppository 650 mg, 650 mg, Rectal, Q6H PRN, Bobette Mo, MD   cholecalciferol (VITAMIN D3) 25 MCG (1000 UNIT) tablet 1,000 Units, 1,000 Units, Oral, Daily, Rejeana Brock, MD   docusate sodium (COLACE) capsule 100 mg, 100 mg, Oral, BID PRN, Marinda Elk, MD   [START ON 09/16/2023] feeding supplement (ENSURE ENLIVE / ENSURE PLUS) liquid 237 mL, 237 mL, Oral, BID BM, Marinda Elk, MD   ferrous gluconate [facility]) tablet 324 mg, 324 mg, Oral, Daily, Marinda Elk, MD   hydroxychloroquine (PLAQUENIL) tablet 200 mg, 200 mg, Oral, Daily, David Stall, Darin Engels, MD, 200 mg at 09/15/23 1002   ondansetron (ZOFRAN) tablet 4 mg, 4 mg, Oral, Q6H PRN **OR** ondansetron (ZOFRAN) injection 4 mg, 4 mg, Intravenous, Q6H PRN, Bobette Mo, MD, 4 mg at 09/10/23 1821   pantoprazole (PROTONIX) EC tablet 40 mg, 40 mg, Oral, BID, Berton Mount I, MD, 40 mg at 09/12/23 2012   potassium chloride SA (KLOR-CON M) CR tablet 40 mEq, 40 mEq, Oral, BID, Marinda Elk, MD, 20 mEq at 09/15/23 1001   senna-docusate (Senokot-S) tablet 1 tablet, 1 tablet, Oral, BID, Zannie Cove, MD, 1 tablet at 09/14/23 1527   sucralfate (CARAFATE) 1 GM/10ML suspension 1 g, 1 g, Oral, TID WC & HS, Berton Mount I, MD, 1 g at 09/12/23 2013   sulfamethoxazole-trimethoprim (BACTRIM DS) 800-160 MG per tablet  1 tablet, 1 tablet, Oral, Q12H, Marinda Elk, MD, 1 tablet at 09/15/23 1001 Labs and Diagnostic Imaging   CSF studies: RBC: 0 WBC: 2 Differential: Too few to count Glucose 40 Protein 52 CSF VDRL negative CSF VZV negative   IgG CSF index -IgG, CSF 11.3 -Albumin, CSF 23 -IgG, serum 2050 -Albumin 3.7 -IgG/Albumin ratio, CSF 0.49 -CSF IgG index 0.9  Serum: RPR negative T palladium negative Meningitis/encephalitis panel negative ESR elevated 85 SSB antibody elevated >8.0 Vitamin D low  10.32 Anti-MOG negative CMV, RPR - negative HIV ab negative HTLV 1+2 ab -negative Serum ACE negative Serum RF negative  Pending studies: Oligoclonal bands Neuromyelitis optica autoantibody ANCA profile     CT Head without contrast: Normal head CT.  MRI cervical, thoracic, lumbar spine w/w/o contrast: Longitudinally extensive spinal cord lesion spanning T1-T9, history of lupus suggesting autoimmune cause.  MRI brain Normal  Assessment   Stacy Kelley is a 32 y.o. female with past medical history significant for lupus(though unclear to me if dx is firm), vitamin B6 deficiency, genital herpes, gonorrhea, substance syndrome who presented 12/11 to the [facility], ED complaining of progressive bilateral leg weakness, numbness and pain x 5 days, with a sensory level at her lower thorax. MRI revealed a longitudinally extensive acute nonenhancing spinal cord lesion extending from the T1 to the T9 level.  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 She completed 5 days of solumedrol on 12/15. She had initially plateaued during steroid treatment so plan was to start PLEX 12/16 but she reported remarkable improvement overnight and was able to walk well with PT today.  Recommendations   - Defer PLEX for now, continue to observe for improvement - PT/OT - Vitamin D supplementation given she was deficient - Will continue to follow  Bing Neighbors, MD [facility] [phone]  If 7pm- 7am, please page neurology on call as listed in AMION.

## 2023-09-15 NOTE — Progress Notes (Signed)
Mobility Specialist: Progress Note   09/15/23 1602  Mobility  Activity Ambulated with assistance in hallway  Level of Assistance Contact guard assist, steadying assist  Assistive Device None;Other (Comment) (occasional use of handrails)  Distance Ambulated (ft) 75 ft  Activity Response Tolerated well  Mobility Referral Yes  Mobility visit 1 Mobility  Mobility Specialist Start Time (ACUTE ONLY) 1455  Mobility Specialist Stop Time (ACUTE ONLY) 1513  Mobility Specialist Time Calculation (min) (ACUTE ONLY) 18 min    Pt was agreeable to mobility session - received on EOB. Occasional CG throughout session for steadying assist, pt using handrails as well. C/o fatigue. Returned to room without fault. Left on EOB with all needs met, call bell in reach.   Maurene Capes Mobility Specialist Please contact via SecureChat or Rehab office at [phone]

## 2023-09-15 NOTE — Progress Notes (Signed)
[facility] PROGRESS NOTE    Progress Note  Stacy Kelley  ZDG:[id number] DOB: 1991-04-20 DOA: 09/09/2023 PCP: Jordan Hawks, PA-C     Brief Narrative:   Stacy Kelley is an 32 y.o. 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 Neurology was consulted completed 5-day course of steroids with minimal improvement. Plex deferred. Continue vitamin D supplementation further management per neurology. CSF was unremarkable. PT OT evaluated the patient recommended acute inpatient rehab. Consult inpatient rehab.  Right armpit drainage: Tender to palpation, started on Bactrim is currently draining serosanguineous fluid.  Hypokalemia: Low again today replete recheck in the morning.  Vitamin D deficiency: Continue oral supplementation.  Iron deficiency anemia Continue iron supplementation.  Systemic lupus erythematosus (HCC) Poorly compliant with her medication Plaquenil as an outpatient. She received 5-day course of steroids follow-up with rheumatology as an outpatient. Restart Plaquenil orally.  Skin rash: Resolving.  Abdominal pain nausea and vomiting: Suspect due to high-dose steroid started on PPI and Carafate. Abdominal ultrasound was unremarkable.  DVT prophylaxis: lovenox Family Communication:none Status is: Inpatient Remains inpatient appropriate because: Acute transverse myelitis    Code Status:     Code Status Orders  (From admission, onward)           Start      Ordered   09/09/23 0937  Full code  Continuous       Question:  By:  Answer:  Consent: discussion documented in EHR   09/09/23 0938           Code Status History     Date Active Date Inactive Code Status Order ID Comments User Context   05/11/2015 2055 05/13/2015 1738 Full Code [id number]  Roe Coombs CNM Inpatient   05/11/2015 1528 05/11/2015 2055 Full Code [id number]  Roe Coombs, CNM Inpatient         IV Access:   Peripheral IV   Procedures and diagnostic studies:   No results found.    Medical Consultants:   None.   Subjective:    Stacy Kelley feels better has not had a bowel movement.  Objective:    Vitals:   09/15/23 0540 09/15/23 0612 09/15/23 0613 09/15/23 0622  BP:    117/85  Pulse:    86  Resp: (!) 21 (!) 8 (!) 8   Temp:    98.2 F (36.8 C)  TempSrc:    Oral  SpO2:    100%  Weight:      Height:       SpO2: 100 %   Intake/Output Summary (Last 24 hours) at 09/15/2023 0819 Last data filed at 09/14/2023 2056 Gross per 24 hour  Intake 480 ml  Output --  Net 480 ml   Filed Weights   09/09/23 0006  Weight: 43.1 kg    Exam: General exam: In no acute distress. Respiratory system: Good air movement and clear to auscultation. Cardiovascular system: S1 & S2 heard, RRR. No JVD. Gastrointestinal system: Abdomen is nondistended, soft and nontender.  Extremities: No pedal edema. Skin: No rashes, lesions or ulcers Psychiatry: Judgement  and insight appear normal. Mood & affect appropriate.  Data Reviewed:    Labs: Basic Metabolic Panel: Recent Labs  Lab 09/09/23 0201 09/10/23 0325 09/14/23 0432 09/15/23 0548  NA 135 135 132* 134*  K 2.9* 4.1 3.9 3.1*  CL 104 105 102 102  CO2 22 22 21* 24  GLUCOSE 106* 111* 135* 92  BUN 12 7 18 17   CREATININE 0.55 0.53 0.72 0.67  CALCIUM 9.7 9.9 9.7 9.5  MG 2.3  --   --   --   PHOS 3.0  --   --   --    GFR Estimated Creatinine Clearance: 69.3 mL/min (by C-G formula based on SCr  of 0.67 mg/dL). Liver Function Tests: Recent Labs  Lab 09/09/23 0201 09/09/23 0529 09/10/23 0325  AST 14*  --  12*  ALT 10  --  11  ALKPHOS 52  --  50  BILITOT 0.5  --  0.5  PROT 8.9*  --  8.8*  ALBUMIN 3.9 3.7* 3.9   Recent Labs  Lab 09/09/23 0201  LIPASE 25   No results for input(s): "AMMONIA" in the last 168 hours. Coagulation profile Recent Labs  Lab 09/14/23 0548 09/15/23 0548  INR 1.1 1.1   COVID-19 Labs  No results for input(s): "DDIMER", "FERRITIN", "LDH", "CRP" in the last 72 hours.  Lab Results  Component Value Date   SARSCOV2NAA NEGATIVE 04/10/2020    CBC: Recent Labs  Lab 09/09/23 0201 09/10/23 0325 09/14/23 0432 09/15/23 0548  WBC 5.9 8.1 6.6 7.0  NEUTROABS 3.6  --   --   --   HGB 11.1* 11.9* 12.1 12.2  HCT 33.5* 36.7 37.4 37.5  MCV 85.2 85.0 84.4 85.4  PLT 290 348 384 365   Cardiac Enzymes: No results for input(s): "CKTOTAL", "CKMB", "CKMBINDEX", "TROPONINI" in the last 168 hours. BNP (last 3 results) No results for input(s): "PROBNP" in the last 8760 hours. CBG: No results for input(s): "GLUCAP" in the last 168 hours. D-Dimer: No results for input(s): "DDIMER" in the last 72 hours. Hgb A1c: No results for input(s): "HGBA1C" in the last 72 hours. Lipid Profile: No results for input(s): "CHOL", "HDL", "LDLCALC", "TRIG", "CHOLHDL", "LDLDIRECT" in the last 72 hours. Thyroid function studies: No results for input(s): "TSH", "T4TOTAL", "T3FREE", "THYROIDAB" in the last 72 hours.  Invalid input(s): "FREET3" Anemia work up: Recent Labs    09/14/23 0432  VITAMINB12 470   Sepsis Labs: Recent Labs  Lab 09/09/23 0201 09/10/23 0325 09/14/23 0432 09/15/23 0548  WBC 5.9 8.1 6.6 7.0   Microbiology Recent Results (from the past 240 hours)  VZV PCR, CSF     Status: None   Collection Time: 09/09/23  5:29 AM   Specimen: Cerebrospinal Fluid  Result Value Ref Range Status   VZV PCR, CSF Negative Negative Final    Comment: (NOTE) No  Varicella Zoster Virus DNA detected. Performed At: [facility] [address] [place], [place] [id number] Jolene Schimke MD OZ:[id number]      Medications:    cholecalciferol  1,000 Units Oral Daily   gabapentin  300 mg Oral TID   pantoprazole  40 mg Oral BID   senna-docusate  1 tablet Oral BID   sucralfate  1 g Oral TID WC & HS   Continuous Infusions:    LOS: 6 days   Marinda Elk  [facility]  09/15/2023, 8:19 AM

## 2023-09-15 NOTE — Plan of Care (Signed)

## 2023-09-15 NOTE — Plan of Care (Signed)

## 2023-09-15 NOTE — Progress Notes (Signed)
Inpatient Rehabilitation Admissions Coordinator   I met at bedside with patient . She would like to pursue possible CIR admit. I await updated therapy notes from today, and then will begin Auth with [facility] Medicaid [employer].  Ottie Glazier, RN, MSN Rehab Admissions Coordinator [phone] 09/15/2023 10:55 AM

## 2023-09-16 ENCOUNTER — Inpatient Hospital Stay (HOSPITAL_COMMUNITY): Payer: Medicaid Other

## 2023-09-16 ENCOUNTER — Encounter (HOSPITAL_COMMUNITY): Payer: Self-pay | Admitting: Physical Medicine and Rehabilitation

## 2023-09-16 ENCOUNTER — Other Ambulatory Visit: Payer: Self-pay

## 2023-09-16 ENCOUNTER — Inpatient Hospital Stay (HOSPITAL_COMMUNITY)
Admission: AD | Admit: 2023-09-16 | Discharge: 2023-09-22 | DRG: 098 | Disposition: A | Discharge status: Home / Self Care | Payer: Medicaid Other | Source: Intra-hospital | Attending: Physical Medicine and Rehabilitation | Admitting: Physical Medicine and Rehabilitation

## 2023-09-16 DIAGNOSIS — Z91199 Patient's noncompliance with other medical treatment and regimen due to unspecified reason: Secondary | ICD-10-CM

## 2023-09-16 DIAGNOSIS — Z880 Allergy status to penicillin: Secondary | ICD-10-CM | POA: Diagnosis not present

## 2023-09-16 DIAGNOSIS — R233 Spontaneous ecchymoses: Secondary | ICD-10-CM | POA: Diagnosis not present

## 2023-09-16 DIAGNOSIS — Z888 Allergy status to other drugs, medicaments and biological substances status: Secondary | ICD-10-CM | POA: Diagnosis not present

## 2023-09-16 DIAGNOSIS — R109 Unspecified abdominal pain: Secondary | ICD-10-CM | POA: Diagnosis present

## 2023-09-16 DIAGNOSIS — K592 Neurogenic bowel, not elsewhere classified: Secondary | ICD-10-CM | POA: Diagnosis present

## 2023-09-16 DIAGNOSIS — L02411 Cutaneous abscess of right axilla: Secondary | ICD-10-CM | POA: Diagnosis present

## 2023-09-16 DIAGNOSIS — G629 Polyneuropathy, unspecified: Secondary | ICD-10-CM | POA: Diagnosis present

## 2023-09-16 DIAGNOSIS — Z79899 Other long term (current) drug therapy: Secondary | ICD-10-CM | POA: Diagnosis not present

## 2023-09-16 DIAGNOSIS — R200 Anesthesia of skin: Secondary | ICD-10-CM | POA: Diagnosis present

## 2023-09-16 DIAGNOSIS — E538 Deficiency of other specified B group vitamins: Secondary | ICD-10-CM | POA: Diagnosis present

## 2023-09-16 DIAGNOSIS — E559 Vitamin D deficiency, unspecified: Secondary | ICD-10-CM | POA: Diagnosis present

## 2023-09-16 DIAGNOSIS — Z881 Allergy status to other antibiotic agents status: Secondary | ICD-10-CM | POA: Diagnosis not present

## 2023-09-16 DIAGNOSIS — M3500 Sicca syndrome, unspecified: Secondary | ICD-10-CM | POA: Diagnosis present

## 2023-09-16 DIAGNOSIS — M329 Systemic lupus erythematosus, unspecified: Secondary | ICD-10-CM | POA: Diagnosis present

## 2023-09-16 DIAGNOSIS — E876 Hypokalemia: Secondary | ICD-10-CM | POA: Diagnosis present

## 2023-09-16 DIAGNOSIS — R110 Nausea: Secondary | ICD-10-CM | POA: Diagnosis present

## 2023-09-16 DIAGNOSIS — D509 Iron deficiency anemia, unspecified: Secondary | ICD-10-CM | POA: Diagnosis present

## 2023-09-16 DIAGNOSIS — R209 Unspecified disturbances of skin sensation: Secondary | ICD-10-CM | POA: Diagnosis not present

## 2023-09-16 DIAGNOSIS — Z8249 Family history of ischemic heart disease and other diseases of the circulatory system: Secondary | ICD-10-CM | POA: Diagnosis not present

## 2023-09-16 DIAGNOSIS — Z833 Family history of diabetes mellitus: Secondary | ICD-10-CM

## 2023-09-16 DIAGNOSIS — N319 Neuromuscular dysfunction of bladder, unspecified: Secondary | ICD-10-CM | POA: Diagnosis present

## 2023-09-16 DIAGNOSIS — G373 Acute transverse myelitis in demyelinating disease of central nervous system: Principal | ICD-10-CM | POA: Diagnosis present

## 2023-09-16 DIAGNOSIS — E871 Hypo-osmolality and hyponatremia: Secondary | ICD-10-CM | POA: Diagnosis present

## 2023-09-16 DIAGNOSIS — L732 Hidradenitis suppurativa: Secondary | ICD-10-CM | POA: Diagnosis present

## 2023-09-16 DIAGNOSIS — A6009 Herpesviral infection of other urogenital tract: Secondary | ICD-10-CM | POA: Diagnosis present

## 2023-09-16 LAB — BASIC METABOLIC PANEL
Anion gap: 14 (ref 5–15)
BUN: 14 mg/dL (ref 6–20)
CO2: 20 mmol/L — ABNORMAL LOW (ref 22–32)
Calcium: 10 mg/dL (ref 8.9–10.3)
Chloride: 99 mmol/L (ref 98–111)
Creatinine, Ser: 0.6 mg/dL (ref 0.44–1.00)
GFR, Estimated: >60 mL/min (ref >60)
Glucose, Bld: 97 mg/dL (ref 70–99)
Potassium: 3.5 mmol/L (ref 3.5–5.1)
Sodium: 133 mmol/L — ABNORMAL LOW (ref 135–145)

## 2023-09-16 LAB — CBC
HCT: 40.3 % (ref 36.0–46.0)
Hemoglobin: 13.4 g/dL (ref 12.0–15.0)
MCH: 27.7 pg (ref 26.0–34.0)
MCHC: 33.3 g/dL (ref 30.0–36.0)
MCV: 83.4 fL (ref 80.0–100.0)
Platelets: 351 10*3/uL (ref 150–400)
RBC: 4.83 MIL/uL (ref 3.87–5.11)
RDW: 13.8 % (ref 11.5–15.5)
WBC: 7.2 10*3/uL (ref 4.0–10.5)
nRBC: 0 % (ref 0.0–0.2)

## 2023-09-16 LAB — VITAMIN E
Vitamin E (Alpha Tocopherol): 7.5 mg/L (ref 5.9–19.4)
Vitamin E(Gamma Tocopherol): 1.1 mg/L (ref 0.7–4.9)

## 2023-09-16 LAB — PROTIME-INR
INR: 1 (ref 0.8–1.2)
Prothrombin Time: 13.8 s (ref 11.4–15.2)

## 2023-09-16 MED ORDER — LIDOCAINE HCL URETHRAL/MUCOSAL 2 % EX GEL: CUTANEOUS | Status: DC | PRN

## 2023-09-16 MED ORDER — PANTOPRAZOLE SODIUM 40 MG PO TBEC
40.0000 mg | DELAYED_RELEASE_TABLET | Freq: Two times a day (BID) | ORAL | Status: DC
  Administered 2023-09-16: 40 mg via ORAL
  Filled 2023-09-16 (×2): qty 1

## 2023-09-16 MED ORDER — MELATONIN 5 MG PO TABS: 5.0000 mg | ORAL_TABLET | Freq: Every evening | ORAL | Status: DC | PRN

## 2023-09-16 MED ORDER — PROCHLORPERAZINE MALEATE 5 MG PO TABS: 5.0000 mg | ORAL_TABLET | Freq: Four times a day (QID) | ORAL | Status: DC | PRN

## 2023-09-16 MED ORDER — SULFAMETHOXAZOLE-TRIMETHOPRIM 800-160 MG PO TABS: 1.0000 | ORAL_TABLET | Freq: Two times a day (BID) | ORAL | Status: DC

## 2023-09-16 MED ORDER — ENSURE ENLIVE PO LIQD
237.0000 mL | Freq: Two times a day (BID) | ORAL | Status: DC
Start: 2023-09-17 — End: 2023-09-22
  Administered 2023-09-17 – 2023-09-21 (×9): 237 mL via ORAL

## 2023-09-16 MED ORDER — BISACODYL 10 MG RE SUPP: 10.0000 mg | Freq: Every day | RECTAL | Status: DC

## 2023-09-16 MED ORDER — SUCRALFATE 1 GM/10ML PO SUSP: 1.0000 g | Freq: Three times a day (TID) | ORAL | Status: DC

## 2023-09-16 MED ORDER — GUAIFENESIN-DM 100-10 MG/5ML PO SYRP: 5.0000 mL | ORAL_SOLUTION | Freq: Four times a day (QID) | ORAL | Status: DC | PRN

## 2023-09-16 MED ORDER — DIPHENHYDRAMINE HCL 25 MG PO CAPS
25.0000 mg | ORAL_CAPSULE | Freq: Four times a day (QID) | ORAL | Status: DC | PRN
  Administered 2023-09-16: 25 mg via ORAL
  Filled 2023-09-16: qty 1

## 2023-09-16 MED ORDER — ENOXAPARIN SODIUM 40 MG/0.4ML IJ SOSY
40.0000 mg | PREFILLED_SYRINGE | INTRAMUSCULAR | Status: DC
  Administered 2023-09-16: 40 mg via SUBCUTANEOUS
  Filled 2023-09-16: qty 0.4

## 2023-09-16 MED ORDER — ALUM & MAG HYDROXIDE-SIMETH 200-200-20 MG/5ML PO SUSP: 30.0000 mL | ORAL | Status: DC | PRN

## 2023-09-16 MED ORDER — ACETAMINOPHEN 325 MG PO TABS
325.0000 mg | ORAL_TABLET | ORAL | Status: DC | PRN
  Administered 2023-09-16 – 2023-09-22 (×3): 650 mg via ORAL
  Filled 2023-09-16 (×3): qty 2

## 2023-09-16 MED ORDER — PROCHLORPERAZINE EDISYLATE 10 MG/2ML IJ SOLN: 5.0000 mg | Freq: Four times a day (QID) | INTRAMUSCULAR | Status: DC | PRN

## 2023-09-16 MED ORDER — HYDROXYCHLOROQUINE SULFATE 200 MG PO TABS
200.0000 mg | ORAL_TABLET | Freq: Every day | ORAL | Status: DC
  Administered 2023-09-17 – 2023-09-22 (×6): 200 mg via ORAL
  Filled 2023-09-16 (×7): qty 1

## 2023-09-16 MED ORDER — SULFAMETHOXAZOLE-TRIMETHOPRIM 800-160 MG PO TABS
1.0000 | ORAL_TABLET | Freq: Two times a day (BID) | ORAL | Status: DC
  Administered 2023-09-16 – 2023-09-22 (×12): 1 via ORAL
  Filled 2023-09-16 (×14): qty 1

## 2023-09-16 MED ORDER — DIPHENHYDRAMINE HCL 25 MG PO CAPS: 25.0000 mg | ORAL_CAPSULE | Freq: Four times a day (QID) | ORAL | Status: DC | PRN

## 2023-09-16 MED ORDER — PROCHLORPERAZINE 25 MG RE SUPP: 12.5000 mg | Freq: Four times a day (QID) | RECTAL | Status: DC | PRN

## 2023-09-16 MED ORDER — FLEET ENEMA RE ENEM
1.0000 | ENEMA | Freq: Once | RECTAL | Status: DC | PRN
Start: 2023-09-16 — End: 2023-09-22

## 2023-09-16 MED ORDER — FERROUS GLUCONATE 324 (38 FE) MG PO TABS
324.0000 mg | ORAL_TABLET | Freq: Every day | ORAL | Status: DC
  Administered 2023-09-17 – 2023-09-22 (×6): 324 mg via ORAL
  Filled 2023-09-16 (×7): qty 1

## 2023-09-16 MED ORDER — SENNOSIDES-DOCUSATE SODIUM 8.6-50 MG PO TABS
2.0000 | ORAL_TABLET | Freq: Every day | ORAL | Status: DC
  Administered 2023-09-18 – 2023-09-20 (×2): 2 via ORAL
  Filled 2023-09-16 (×6): qty 2

## 2023-09-16 MED ORDER — SUCRALFATE 1 GM/10ML PO SUSP
1.0000 g | Freq: Three times a day (TID) | ORAL | Status: DC
  Filled 2023-09-16 (×2): qty 10

## 2023-09-16 MED ORDER — DIPHENHYDRAMINE HCL 25 MG PO CAPS
25.0000 mg | ORAL_CAPSULE | Freq: Four times a day (QID) | ORAL | Status: DC | PRN
  Filled 2023-09-16: qty 1

## 2023-09-16 MED ORDER — VITAMIN D (ERGOCALCIFEROL) 1.25 MG (50000 UNIT) PO CAPS
50000.0000 [IU] | ORAL_CAPSULE | ORAL | Status: DC
  Administered 2023-09-17: 50000 [IU] via ORAL
  Filled 2023-09-16: qty 1

## 2023-09-16 MED ORDER — PANTOPRAZOLE SODIUM 40 MG PO TBEC: 40.0000 mg | DELAYED_RELEASE_TABLET | Freq: Every day | ORAL | Status: DC

## 2023-09-16 NOTE — Progress Notes (Signed)
Horton Chin, MD  Physician Physical Medicine and Rehabilitation   PMR Pre-admission     Signed   Date of Service: 09/12/2023  4:12 PM  Related encounter: ED to Hosp-Admission (Current) from 09/09/2023 in [facility]   Signed     Expand All Collapse All  Show:Clear all [x] Written[x] Templated[] Copied  Added by: [x] Standley Brooking, RN[x] Theodoro Kos, Lauren P, CCC-SLP[x] Raulkar, Drema Pry, MD  [] Hover for details PMR Admission Coordinator Pre-Admission Assessment   Patient: Stacy Kelley is an 32 y.o., female MRN: [id number] DOB: Nov 07, 1990 Height: 5\' 2"  (157.5 cm) Weight: 43.1 kg   Insurance Information HMO:     PPO:      PCP:      IPA:      80/20:      OTHER:  PRIMARY: [employer] Medicaid [employer]      Policy#: [id number]  ; Medicaid # [id number] s    Subscriber: patient CM Name: via fax approval      Phone#: [phone]     Fax#: [phone] Pre-Cert#: [id number] approved 12/18 until 12/25     Employer:  Benefits:  Phone #: [phone]     Name: 12/17 Eff. Date: 03/29/2020 until 09/28/2024     Deduct: none      Out of Pocket Max: none      Life Max: none CIR: per medicaid      SNF: per medicaid Outpatient: per medicaid     Co-Pay:  Home Health: per medicaid      Co-Pay:  DME: per medicaid     Co-Pay:  Providers: in-network  SECONDARY: none   Financial Counselor:       Phone#:    The Data processing manager" for patients in Inpatient Rehabilitation Facilities with attached "Privacy Act Statement-Health Care Records" was provided and verbally reviewed with: N/A   Emergency Contact Information Contact Information       Name Relation Home Work Mobile    [place] Sister     [phone]    McDougal,Korree Significant other     [phone]         Other Contacts       Name Relation Home Work Mobile    Deale Grandmother [phone]   [phone]         Current  Medical History  Patient Admitting Diagnosis: transverse myelitis   History of Present Illness: Pt is a  32 year old female with medical hx significant for: Systemic lupus erythematosus, genital herpes, gonorrhea, Sjogren syndrome, hyperpigmentation, thyromegaly, vitamin D deficiency, iron deficiency anemia.  Pt presented to [facility] on 09/08/23 d/t progression of myalgia and paresthesia. Pt developed incontinence and also had bilateral foot rash. Pt recently began taking prednisone. 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 Pt started on 1g Solu-medrol x5 days. CT abdomen/pelvis negative. Neurology consulted. CT head negative. LP performed. Pt transferred to [facility] on 12/11. Restarted home med of Plaquenil. Lovenox for DVT prophylaxis.    Patient's medical record from [facility] has been reviewed by the rehabilitation admission coordinator and physician.   Past Medical History      Past Medical History:  Diagnosis Date   Genital herpes     Gonorrhea     Sjogren's syndrome (HCC) 05/12/2022   SLE (systemic lupus erythematosus) (HCC)     Thyromegaly 09/03/2022   Vitamin D deficiency 06/11/2022        Has the patient had major  surgery during 100 days prior to admission? No   Family History   family history includes Diabetes in her maternal grandfather, maternal grandmother, paternal grandfather, and paternal grandmother; Hypertension in her paternal grandfather.   Current Medications  Current Medications    Current Facility-Administered Medications:    acetaminophen (TYLENOL) tablet 650 mg, 650 mg, Oral, Q6H PRN, 650 mg at 09/14/23 1528 **OR** acetaminophen (TYLENOL) suppository 650 mg, 650 mg, Rectal, Q6H PRN, Bobette Mo, MD   cholecalciferol (VITAMIN D3) 25 MCG (1000 UNIT) tablet 1,000 Units, 1,000 Units, Oral, Daily, Rejeana Brock, MD   diphenhydrAMINE (BENADRYL) capsule 25 mg, 25 mg, Oral, Q6H PRN,  Marinda Elk, MD   docusate sodium (COLACE) capsule 100 mg, 100 mg, Oral, BID PRN, Marinda Elk, MD   feeding supplement (ENSURE ENLIVE / ENSURE PLUS) liquid 237 mL, 237 mL, Oral, BID BM, Marinda Elk, MD, 237 mL at 09/16/23 0901   ferrous gluconate (FERGON) tablet 324 mg, 324 mg, Oral, Daily, Marinda Elk, MD   hydroxychloroquine (PLAQUENIL) tablet 200 mg, 200 mg, Oral, Daily, David Stall, Darin Engels, MD, 200 mg at 09/15/23 1002   ondansetron (ZOFRAN) tablet 4 mg, 4 mg, Oral, Q6H PRN **OR** ondansetron (ZOFRAN) injection 4 mg, 4 mg, Intravenous, Q6H PRN, Bobette Mo, MD, 4 mg at 09/10/23 1821   pantoprazole (PROTONIX) EC tablet 40 mg, 40 mg, Oral, BID, Berton Mount I, MD, 40 mg at 09/12/23 2012   senna-docusate (Senokot-S) tablet 1 tablet, 1 tablet, Oral, BID, Zannie Cove, MD, 1 tablet at 09/14/23 1527   sucralfate (CARAFATE) 1 GM/10ML suspension 1 g, 1 g, Oral, TID WC & HS, Berton Mount I, MD, 1 g at 09/12/23 2013   sulfamethoxazole-trimethoprim (BACTRIM DS) 800-160 MG per tablet 1 tablet, 1 tablet, Oral, Q12H, Marinda Elk, MD, 1 tablet at 09/15/23 1001     Patients Current Diet:  Diet Order                  Diet - low sodium heart healthy             Diet regular Fluid consistency: Thin  Diet effective now                       Precautions / Restrictions Precautions Precautions: Fall Restrictions Weight Bearing Restrictions Per Provider Order: No    Has the patient had 2 or more falls or a fall with injury in the past year? Yes   Prior Activity Level Community (5-7x/wk): works, drives, gets out of house frequently   Prior Functional Level Self Care: Did the patient need help bathing, dressing, using the toilet or eating? Independent   Indoor Mobility: Did the patient need assistance with walking from room to room (with or without device)? Independent   Stairs: Did the patient need assistance with internal or  external stairs (with or without device)? Independent   Functional Cognition: Did the patient need help planning regular tasks such as shopping or remembering to take medications? Independent   Patient Information Are you of Hispanic, Latino/a,or Spanish origin?: A. No, not of Hispanic, Latino/a, or Spanish origin What is your race?: B. Black or African American Do you need or want an interpreter to communicate with a doctor or health care staff?: 0. No   Patient's Response To:  Health Literacy and Transportation Is the patient able to respond to health literacy and transportation needs?: Yes Health Literacy - How often do you need  to have someone help you when you read instructions, pamphlets, or other written material from your doctor or pharmacy?: Never In the past 12 months, has lack of transportation kept you from medical appointments or from getting medications?: No In the past 12 months, has lack of transportation kept you from meetings, work, or from getting things needed for daily living?: No   Home Assistive Devices / Equipment Home Equipment: None   Prior Device Use: Indicate devices/aids used by the patient prior to current illness, exacerbation or injury? None of the above   Current Functional Level Cognition   Overall Cognitive Status: Within Functional Limits for tasks assessed Orientation Level: Oriented X4 General Comments: pleased to be more mobile and less pain    Extremity Assessment (includes Sensation/Coordination)   Upper Extremity Assessment: Generalized weakness  Lower Extremity Assessment: Generalized weakness, RLE deficits/detail, LLE deficits/detail RLE Sensation: decreased light touch, decreased proprioception LLE Sensation: decreased light touch, decreased proprioception     ADLs   Overall ADL's : Needs assistance/impaired Eating/Feeding: Independent, Sitting Grooming: Wash/dry hands, Wash/dry face, Oral care, Supervision/safety, Standing Grooming  Details (indicate cue type and reason): at sink Upper Body Bathing: Set up, Sitting Lower Body Bathing: Minimal assistance, Sit to/from stand Upper Body Dressing : Set up, Sitting Upper Body Dressing Details (indicate cue type and reason): gown to cover back Lower Body Dressing: Contact guard assist, Sit to/from stand Toilet Transfer: Minimal assistance, Ambulation, Rolling walker (2 wheels), Grab bars, Comfort height toilet Toileting- Clothing Manipulation and Hygiene: Contact guard assist, Sit to/from stand     Mobility   Overal bed mobility: Needs Assistance Bed Mobility: Supine to Sit Supine to sit: Supervision, HOB elevated General bed mobility comments: OOB in chair upon entry     Transfers   Overall transfer level: Needs assistance Equipment used: None Transfers: Sit to/from Stand Sit to Stand: Contact guard assist Bed to/from chair/wheelchair/BSC transfer type:: Step pivot, Stand pivot Stand pivot transfers: Min assist Step pivot transfers: Contact guard assist General transfer comment: 1 person HHA for mobility and transfers in room     Ambulation / Gait / Stairs / Wheelchair Mobility   Ambulation/Gait Ambulation/Gait assistance: Editor, commissioning (Feet): 80 Feet Assistive device: None, 1 person hand held assist Gait Pattern/deviations: Step-through pattern, Decreased stride length General Gait Details: Slow, hesitant steps with intermittent single HHA for increased stability. Verbal cues for larger step lengths and increased foot clearance. Gait velocity: decreased Gait velocity interpretation: <1.8 ft/sec, indicate of risk for recurrent falls     Posture / Balance Dynamic Sitting Balance Sitting balance - Comments: seated on EOB upon entry Static Standing Balance Tandem Stance - Right Leg: 0 Tandem Stance - Left Leg: 0 Rhomberg - Eyes Opened: 10 Rhomberg - Eyes Closed: 10 Balance Overall balance assessment: Needs assistance Sitting-balance support: No  upper extremity supported, Feet supported Sitting balance-Leahy Scale: Good Sitting balance - Comments: seated on EOB upon entry Standing balance support: No upper extremity supported, During functional activity Standing balance-Leahy Scale: Fair Standing balance comment: stood at sink for grooming tasks and able to stand without UE support while washing hands and pulling up clothing. Tandem Stance - Right Leg: 0 Tandem Stance - Left Leg: 0 Rhomberg - Eyes Opened: 10 Rhomberg - Eyes Closed: 10     Special needs/care consideration      Previous Home Environment  Living Arrangements: Other (Comment), Children (children's father) Available Help at Discharge: Family, Available 24 hours/day Type of Home: Apartment Home Layout: One level  Home Access: Stairs to enter Entrance Stairs-Rails: Right, Left Entrance Stairs-Number of Steps: flight Bathroom Shower/Tub: Engineer, manufacturing systems: Standard Bathroom Accessibility: Yes How Accessible: Accessible via walker Home Care Services: No   Discharge Living Setting Plans for Discharge Living Setting: Patient's home Type of Home at Discharge: Apartment Discharge Home Layout: One level Discharge Home Access: Stairs to enter Entrance Stairs-Rails: Right, Left Entrance Stairs-Number of Steps: flight Discharge Bathroom Shower/Tub: Tub/shower unit Discharge Bathroom Toilet: Standard Discharge Bathroom Accessibility: Yes How Accessible: Accessible via walker Does the patient have any problems obtaining your medications?: No   Social/Family/Support Systems Anticipated Caregiver: Junious Silk, significant other Anticipated Caregiver's Contact Information: [phone] Caregiver Availability: 24/7 Discharge Plan Discussed with Primary Caregiver: Yes Is Caregiver In Agreement with Plan?: Yes Does Caregiver/Family have Issues with Lodging/Transportation while Pt is in Rehab?: No   Goals Patient/Family Goal for Rehab: Mod I to  supervision with PT and OT Expected length of stay: ELOS 5- 7 days Pt/Family Agrees to Admission and willing to participate: Yes Program Orientation Provided & Reviewed with Pt/Caregiver Including Roles  & Responsibilities: Yes   Decrease burden of Care through IP rehab admission: NA   Possible need for SNF placement upon discharge: Not anticipated   Patient Condition: I have reviewed medical records from [facility], spoken with CM, and patient and spouse. I met with patient at the bedside and discussed via phone for inpatient rehabilitation assessment.  Patient will benefit from ongoing PT and OT, can actively participate in 3 hours of therapy a day 5 days of the week, and can make measurable gains during the admission.  Patient will also benefit from the coordinated team approach during an Inpatient Acute Rehabilitation admission.  The patient will receive intensive therapy as well as Rehabilitation physician, nursing, social worker, and care management interventions.  Due to safety, skin/wound care, disease management, medication administration, pain management, and patient education the patient requires 24 hour a day rehabilitation nursing.  The patient is currently min assist overall with mobility and basic ADLs.  Discharge setting and therapy post discharge at home with home health is anticipated.  Patient has agreed to participate in the Acute Inpatient Rehabilitation Program and will admit today.   Preadmission Screen Completed By:  Ottie Glazier RN MSN 09/16/2023 10:16 AM ______________________________________________________________________   Discussed status with Dr. Carlis Abbott on 09/16/23 at 1016 and received approval for admission today.   Admission Coordinator:  Ottie Glazier RN MSN, time 1016 Date 09/16/2023    Assessment/Plan: Diagnosis: Acute transverse myelitis Does the need for close, 24 hr/day Medical supervision in concert with the patient's rehab needs make it  unreasonable for this patient to be served in a less intensive setting? Yes Co-Morbidities requiring supervision/potential complications: Lupus, petechiae, folliculitis Due to bladder management, bowel management, safety, skin/wound care, disease management, medication administration, pain management, and patient education, does the patient require 24 hr/day rehab nursing? Yes Does the patient require coordinated care of a physician, rehab nurse, PT, OT to address physical and functional deficits in the context of the above medical diagnosis(es)? Yes Addressing deficits in the following areas: balance, endurance, locomotion, strength, transferring, bowel/bladder control, bathing, dressing, feeding, grooming, toileting, and psychosocial support Can the patient actively participate in an intensive therapy program of at least 3 hrs of therapy 5 days a week? Yes The potential for patient to make measurable gains while on inpatient rehab is excellent Anticipated functional outcomes upon discharge from inpatient rehab: modified independent PT, modified independent OT, n/a SLP  Estimated rehab length of stay to reach the above functional goals is: 7-10 days Anticipated discharge destination: Home 10. Overall Rehab/Functional Prognosis: excellent     MD Signature: Sula Soda, MD         Revision History  Routing History

## 2023-09-16 NOTE — H&P (Signed)
Physical Medicine and Rehabilitation Admission H&P    Chief Complaint  Patient presents with   Functional deficits due to transverse myelitis     HPI: Stacy Kelley is a 32 year old female with history of SLE--non compliant w/plaquenil, Sjogren's syndrome, genital herpes, GC, Vit D deficiency; who was admitted on 09/09/23 with 8 day history of BLE numbness and excruciating abdominal pain with weakness. 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 LP done revealing glucose 40 and protein 52 with elevated CSF IgG-11.3 and serum IgG-2050. Dr. Otelia Limes felt that work up c/w acute transverse myelitis and he was treated Solumedrol X 5 days. She has had issues with nausea and abdominal pain with concerns of GB disease and ultrasound was negative for GB wall thickening, stones or Murphy sign. PPI increased to BID and gabapentin recommended to help manage sensory deficits with ongoing weakness. MRI brain was negative for acute abnormality.   Dr. Amada Jupiter recommended vitamin D supplementation as well as plasmapheresis but patient reported resolution of pain but continues to have numbness with pain in side but has gotten better. She continues to have numbness with inability to feel urge to defecate or urinate. She declined this due to  improvement in symptoms and wanted to wait and see if she could recover with therapy.  She developed drainage from right axilla and was started on Bactrim on 12/17. She feels that she is getting too much medications and has been refusing multiple meds. She developed petechial lesions bilateral feet overnight felt to be due to septra, she is concerned about her weight loss as well as inability to feel or push to have BM or to urinate.  Therapy has been working with patient who currently requires CGA to Min assist. She was independent PTA and CIR recommended due to functional decline.  She experienced significant improvements with steroids.    Review of Systems  Constitutional:  Positive for weight loss.  HENT:  Negative for hearing loss.   Eyes:  Negative for blurred vision and double vision.  Respiratory:  Negative for cough and shortness of breath.   Cardiovascular:  Positive for leg swelling. Negative for chest pain.  Gastrointestinal:  Positive for constipation. Negative for heartburn.       Unable to feel urge to or push to defecate  Genitourinary:        Cannot feel ability to urinate/urine as it comes out  Musculoskeletal:  Positive for myalgias.  Neurological:  Positive for sensory change (numb from mid chest down.) and weakness.  Psychiatric/Behavioral:  Positive for depression. The patient is nervous/anxious.     Past Medical History:  Diagnosis Date   Genital herpes    Gonorrhea    Sjogren's syndrome (HCC) 05/12/2022   SLE (systemic lupus erythematosus) (HCC)    Thyromegaly 09/03/2022   Vitamin D deficiency 06/11/2022    Past Surgical History:  Procedure Laterality Date   CESAREAN SECTION      Family History  Problem Relation Age of Onset   Diabetes Maternal Grandmother    Diabetes Maternal Grandfather    Diabetes Paternal Grandmother    Hypertension Paternal Grandfather    Diabetes Paternal Grandfather     Social History:  Lives with 2 children and was independent PTA. She reports that she has never smoked. She has never used smokeless tobacco. She reports that she smokes marijuana every other week. She does not use any  alcohol does not drink alcohol and does not use drugs.   Allergies  Allergen Reactions   Prednisone Other (See Comments)    Feet became swollen and blotchy rashes appeared on the feet shortly after starting this   Augmentin [Amoxicillin-Pot Clavulanate] Swelling and Rash   Doxycycline Swelling, Rash and Other (See Comments)    Feet became swollen and blotchy rashes appeared on the feet shortly after starting this    Penicillins Swelling, Rash and Other (See Comments)    Feet became swollen and blotchy rashes appeared on the feet shortly after starting this    Medications Prior to Admission  Medication Sig Dispense Refill   diphenhydrAMINE (BENADRYL) 25 mg capsule Take 1 capsule (25 mg total) by mouth every 6 (six) hours as needed for itching or allergies.     ferrous gluconate (FERGON) 324 MG tablet Take 324 mg by mouth daily as needed (for low iron).     hydroxychloroquine (PLAQUENIL) 200 MG tablet Take 200 mg by mouth daily.     ibuprofen (ADVIL) 800 MG tablet Take 800 mg by mouth every 8 (eight) hours as needed for mild pain (pain score 1-3) or headache.     pantoprazole (PROTONIX) 40 MG tablet Take 1 tablet (40 mg total) by mouth daily.     potassium chloride SA (KLOR-CON M) 20 MEQ tablet Take 2 tablets (40 mEq total) by mouth daily for 3 days. (Patient not taking: Reported on 09/09/2023) 6 tablet 0   sucralfate (CARAFATE) 1 GM/10ML suspension Take 10 mLs (1 g total) by mouth 4 (four) times daily -  with meals and at bedtime.     sulfamethoxazole-trimethoprim (BACTRIM DS) 800-160 MG tablet Take 1 tablet by mouth every 12 (twelve) hours for 3 days.     Home: Home Living Family/patient expects to be discharged to:: Private residence Living Arrangements: Other (Comment), Children (children's father) Available Help at Discharge: Family, Available 24 hours/day Type of Home: Apartment Home Access: Stairs to enter Secretary/administrator of Steps: flight Entrance Stairs-Rails: Right, Left Home Layout: One level Bathroom Shower/Tub: Associate Professor: Yes Home Equipment: None   Functional History: Prior Function Prior Level of Function : Independent/Modified Independent, Driving   Functional Status:  Mobility: Bed Mobility Overal bed mobility: Needs Assistance Bed Mobility: Sit to Supine Supine to sit: Supervision, HOB elevated Sit to supine:  Supervision, HOB elevated General bed mobility comments: OOB in chair upon entry Transfers Overall transfer level: Needs assistance Equipment used: None Transfers: Sit to/from Stand Sit to Stand: Contact guard assist Bed to/from chair/wheelchair/BSC transfer type:: Step pivot, Stand pivot Stand pivot transfers: Min assist Step pivot transfers: Contact guard assist General transfer comment: CGA for safety, uses B UE to push from recliner Ambulation/Gait Ambulation/Gait assistance: Min assist Gait Distance (Feet): 30 Feet Assistive device: None, 1 person hand held assist Gait Pattern/deviations: Step-through pattern, Decreased stride length General Gait Details: short steps w/ HHA for increased stability. Cues for increased step length Gait velocity: dec Gait velocity interpretation: <1.8 ft/sec, indicate of risk for recurrent falls   ADL: ADL Overall ADL's : Needs assistance/impaired Eating/Feeding: Independent, Sitting Grooming: Wash/dry hands, Wash/dry face, Oral care, Supervision/safety, Standing Grooming Details (indicate cue type and reason): at sink Upper Body Bathing: Set up, Sitting Lower Body Bathing: Minimal assistance, Sit to/from stand Upper Body Dressing : Set up, Sitting Upper Body Dressing Details (indicate cue type and reason): gown to cover back Lower Body Dressing: Contact guard assist, Sit to/from stand Toilet Transfer:  Minimal assistance, Ambulation, Rolling walker (2 wheels), Grab bars, Comfort height toilet Toileting- Clothing Manipulation and Hygiene: Contact guard assist, Sit to/from stand   Cognition: Cognition Overall Cognitive Status: Within Functional Limits for tasks assessed Orientation Level: Oriented X4 Cognition Arousal: Alert Behavior During Therapy: WFL for tasks assessed/performed Overall Cognitive Status: Within Functional Limits for tasks assessed General Comments: pleased to be more mobile and less pain    Blood pressure (!) 140/94,  pulse 85, temperature 98.1 F (36.7 C), temperature source Oral, resp. rate 18, weight 43.2 kg, last menstrual period 11/14/2022, SpO2 100%.  Physical Exam Gen: no distress, normal appearing HEENT: oral mucosa pink and moist, NCAT Cardio: Reg rate Chest: normal effort, normal rate of breathing Abd: soft, non-distended Ext: no edema Psych: pleasant, normal affect Skin:    Comments: Right breast with quarter size abrasion on outer aspect. Multiple areas of discoloration on bilateral shins, breasts and feet.   Neurological:     Mental Status: She is alert. 4-/5 strength in bilateral lower extremities, decreased sensation from umbilicus downward  Results for orders placed or performed during the hospital encounter of 09/09/23 (from the past 48 hours)  Basic metabolic panel     Status: Abnormal   Collection Time: 09/15/23  5:48 AM  Result Value Ref Range   Sodium 134 (L) 135 - 145 mmol/L   Potassium 3.1 (L) 3.5 - 5.1 mmol/L   Chloride 102 98 - 111 mmol/L   CO2 24 22 - 32 mmol/L   Glucose, Bld 92 70 - 99 mg/dL    Comment: Glucose reference range applies only to samples taken after fasting for at least 8 hours.   BUN 17 6 - 20 mg/dL   Creatinine, Ser 9.14 0.44 - 1.00 mg/dL   Calcium 9.5 8.9 - 78.2 mg/dL   GFR, Estimated >95 >62 mL/min    Comment: (NOTE) Calculated using the CKD-EPI Creatinine Equation (2021)    Anion gap 8 5 - 15    Comment: Performed at [facility], [address], [place], [place] [address]  CBC     Status: None   Collection Time: 09/15/23  5:48 AM  Result Value Ref Range   WBC 7.0 4.0 - 10.5 K/uL   RBC 4.39 3.87 - 5.11 MIL/uL   Hemoglobin 12.2 12.0 - 15.0 g/dL   HCT 57.8 46.9 - 62.9 %   MCV 85.4 80.0 - 100.0 fL   MCH 27.8 26.0 - 34.0 pg   MCHC 32.5 30.0 - 36.0 g/dL   RDW 52.8 41.3 - 24.4 %   Platelets 365 150 - 400 K/uL   nRBC 0.0 0.0 - 0.2 %    Comment: Performed at [facility], [address] [place], [place] [address]  Protime-INR      Status: None   Collection Time: 09/15/23  5:48 AM  Result Value Ref Range   Prothrombin Time 14.4 11.4 - 15.2 seconds   INR 1.1 0.8 - 1.2    Comment: (NOTE) INR goal varies based on device and disease states. Performed at [facility], [address], [place], [place] [address]   Basic metabolic panel     Status: Abnormal   Collection Time: 09/16/23 12:21 PM  Result Value Ref Range   Sodium 133 (L) 135 - 145 mmol/L   Potassium 3.5 3.5 - 5.1 mmol/L   Chloride 99 98 - 111 mmol/L   CO2 20 (L) 22 - 32 mmol/L   Glucose, Bld 97 70 -  99 mg/dL    Comment: Glucose reference range applies only to samples taken after fasting for at least 8 hours.   BUN 14 6 - 20 mg/dL   Creatinine, Ser 3.32 0.44 - 1.00 mg/dL   Calcium 95.1 8.9 - 88.4 mg/dL   GFR, Estimated >16 >60 mL/min    Comment: (NOTE) Calculated using the CKD-EPI Creatinine Equation (2021)    Anion gap 14 5 - 15    Comment: Performed at [facility], [address], [place], [place] [address]  CBC     Status: None   Collection Time: 09/16/23 12:21 PM  Result Value Ref Range   WBC 7.2 4.0 - 10.5 K/uL   RBC 4.83 3.87 - 5.11 MIL/uL   Hemoglobin 13.4 12.0 - 15.0 g/dL   HCT 01.0 93.2 - 35.5 %   MCV 83.4 80.0 - 100.0 fL   MCH 27.7 26.0 - 34.0 pg   MCHC 33.3 30.0 - 36.0 g/dL   RDW 73.2 20.2 - 54.2 %   Platelets 351 150 - 400 K/uL   nRBC 0.0 0.0 - 0.2 %    Comment: Performed at [facility], [address] [place], [place] [address]  Protime-INR     Status: None   Collection Time: 09/16/23 12:21 PM  Result Value Ref Range   Prothrombin Time 13.8 11.4 - 15.2 seconds   INR 1.0 0.8 - 1.2    Comment: (NOTE) INR goal varies based on device and disease states. Performed at [facility], [address], [place], [place] [address]    No results found.    Blood pressure (!) 140/94, pulse 85, temperature 98.1 F (36.7 C), temperature source Oral, resp. rate 18, weight 43.2 kg, last menstrual period  11/14/2022, SpO2 100%.  Medical Problem List and Plan: 1. Functional deficits secondary to transverse myelitis  -discussed prognosis  -patient may shower  -ELOS/Goals: 7 days modI  Admit to CIR 2.  Antithrombotics: -DVT/anticoagulation:  Pharmaceutical: Lovenox added today  -antiplatelet therapy: N/A 3. Pain Management:  tylenol prn.  4. Mood/Behavior/Sleep:  LCSW to follow for evaluation and support.   -antipsychotic agents: N/A  --has been unable to sleep  (anxiety?).  --Patient aware that Melatonin added prn for sleep  as well as gabapentin prn for pain/neuropathy.  5. Neuropsych/cognition: This patient is capable of making decisions on her own behalf. 6. Skin/Wound Care: Routine pressure relief measures.  7. Fluids/Electrolytes/Nutrition: Monitor I/O. Check CMET in am  8. SLE/Sjogren's: Continue Plaquenil as is willing to take it.   --discussed importance of compliance and also will work on simplifying medication regimen.   9. Vitamin D deficiency @ 10.32: Will change Vit D3 to Ergocalciferol due to severity of deficiency.   10. Recurrent right axilla hidradenitis: Had episode last month that was drained in ED but did not take atbx due to concerns of SE.  --Continue Bactrim dose #2 but developed petechiae last night. Willing to take it with benadryl premedication but would like it drained before it gets big again --Consult surgery for I & D?  11. Neurogenic bowel: Will check KUB--start bowel program with suppository at nights  12. Neurogenic bladder: Discussed toileting every 4 hours and DV/Coude bladder  --Monitor voiding with PVR checks.   13. 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  --Protonix and Carafate was added but has been refusing it so will d/c  14.. Hypokalemia: Has been repleted. Recheck in am--refused labs today  15.Marland Kitchen Hyponatremia: Will  check follow up labs in am.  16..  Hx B12 deficiency:470--> WNL on recheck.    I have personally  performed a face to face diagnostic evaluation, including, but not limited to relevant history and physical exam findings, of this patient and developed relevant assessment and plan.  Additionally, I have reviewed and concur with the physician assistant's documentation above.  Delle Reining, PA-C  Horton Chin, MD 09/16/2023

## 2023-09-16 NOTE — Discharge Summary (Signed)
Physician Discharge Summary  Stacy Kelley WGN:[id number] DOB: 09/07/1991 DOA: 09/09/2023  PCP: Jordan Hawks, PA-C  Admit date: 09/09/2023 Discharge date: 09/16/2023  Admitted From: Home Disposition: Inpatient rehab   Recommendations for Outpatient Follow-up:  Follow up with rheumatology in 1-2 weeks Please obtain BMP/CBC in one week.  Home Health:No Equipment/Devices:None  Discharge Condition:Guarded CODE STATUS:Full Diet recommendation: Heart Healthy  Brief/Interim Summary:  32 y.o. female past history SLE and Sjogren, vitamin D deficiency and iron deficiency anemia who was admitted for bilateral lower extremity numbness and pain noted to be hypokalemic, MRI of the cervical thoracic and lumbar spine revealed extensive spinal cord lesions suggestive of myelitis, neurology was consulted completed 5-day course of IV high-dose Solu-Medrol and symptoms improved mildly.  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 Neurology was consulted and she was started on high-dose steroids with a dramatic improvement after the 6th day. Neurology deferred on Plex as she has significant improvement. CSF was unremarkable. PT OT was consulted and she will need to go to inpatient rehab.  Cellulitis of the right arm.  She was started on Bactrim, draining serosanguineous fluid. Not warm or tender smaller in size. Continue Bactrim for 3 more days.  Hypokalemia: Replete orally now improved.  Vitamin D deficiency: Continue oral supplementation.  Iron deficiency anemia: Continue iron supplementation.  SLE: She has not been compliant with her medication as an outpatient. She received 5-day course of  steroids. She was restarted on her Plaquenil she will need to restart with rheumatology as an outpatient.  Skin rash: Lower extremities improved. Continue Benadryl.  Epigastric abdominal pain with nausea and vomiting: Likely due to high-dose steroid she was started on Carafate and PPI she will continue this for 2 weeks. Abdominal ultrasound was unremarkable.    Discharge Instructions  Discharge Instructions     Diet - low sodium heart healthy   Complete by: As directed    Increase activity slowly   Complete by: As directed       Allergies as of 09/16/2023       Reactions   Prednisone Other (See Comments)   Feet became swollen and blotchy rashes appeared on the feet shortly after starting this   Augmentin [amoxicillin-pot Clavulanate] Swelling, Rash   Doxycycline Swelling, Rash, Other (See Comments)   Feet became swollen and blotchy rashes appeared on the feet shortly after starting this   Penicillins Swelling, Rash, Other (See Comments)   Feet became swollen and blotchy rashes appeared on the feet shortly after starting this        Medication List     STOP taking these medications    docusate sodium 100 MG capsule Commonly known as: COLACE   hydrocortisone 2.5 % rectal cream Commonly known as: ANUSOL-HC       TAKE these medications    diphenhydrAMINE 25 mg capsule Commonly known as: BENADRYL Take 1 capsule (25 mg total) by mouth every 6 (six) hours as needed for itching or allergies.   ferrous gluconate 324 MG tablet Commonly known as: FERGON Take 324 mg by mouth daily as needed (for low iron).   hydroxychloroquine 200 MG tablet Commonly known as: PLAQUENIL Take 200 mg by mouth daily.   ibuprofen 800 MG tablet Commonly known  as: ADVIL Take 800 mg by mouth every 8 (eight) hours as needed for mild pain (pain score 1-3) or headache.   pantoprazole 40 MG tablet Commonly known as: PROTONIX Take 1 tablet (40 mg total) by mouth daily.   potassium  chloride SA 20 MEQ tablet Commonly known as: KLOR-CON M Take 2 tablets (40 mEq total) by mouth daily for 3 days.   sucralfate 1 GM/10ML suspension Commonly known as: CARAFATE Take 10 mLs (1 g total) by mouth 4 (four) times daily -  with meals and at bedtime.   sulfamethoxazole-trimethoprim 800-160 MG tablet Commonly known as: BACTRIM DS Take 1 tablet by mouth every 12 (twelve) hours for 3 days.        Allergies  Allergen Reactions   Prednisone Other (See Comments)    Feet became swollen and blotchy rashes appeared on the feet shortly after starting this   Augmentin [Amoxicillin-Pot Clavulanate] Swelling and Rash   Doxycycline Swelling, Rash and Other (See Comments)    Feet became swollen and blotchy rashes appeared on the feet shortly after starting this   Penicillins Swelling, Rash and Other (See Comments)    Feet became swollen and blotchy rashes appeared on the feet shortly after starting this    Consultations: Neurology   Procedures/Studies: US Abdomen Limited RUQ (LIVER/GB) Result Date: 09/12/2023 CLINICAL DATA:  Abdomen pain EXAM: ULTRASOUND ABDOMEN LIMITED RIGHT UPPER QUADRANT COMPARISON:  CT 09/09/2023 FINDINGS: Gallbladder: No gallstones or wall thickening visualized. No sonographic Murphy sign noted by sonographer. Common bile duct: Diameter: 3.3 mm Liver: No focal lesion identified. Within normal limits in parenchymal echogenicity. Portal vein is patent on color Doppler imaging with normal direction of blood flow towards the liver. Other: None. IMPRESSION: Negative examination. Electronically Signed   By: Jasmine Pang M.D.   On: 09/12/2023 16:05   MR BRAIN W WO CONTRAST Result Date: 09/12/2023 CLINICAL DATA:  Initial evaluation for acute ataxia. EXAM: MRI HEAD WITHOUT AND WITH CONTRAST TECHNIQUE: Multiplanar, multiecho pulse sequences of the brain and surrounding structures were obtained without and with intravenous contrast. CONTRAST:  4mL GADAVIST GADOBUTROL 1  MMOL/ML IV SOLN COMPARISON:  Prior head CT from 09/09/2023 FINDINGS: Brain: Cerebral volume within normal limits for age. No focal parenchymal signal abnormality. No abnormal foci of restricted diffusion to suggest acute or subacute ischemia. Gray-white matter differentiation well maintained. No encephalomalacia to suggest chronic cortical infarction or other insult. No foci of susceptibility artifact indicative of acute or chronic intracranial blood products. No mass lesion, midline shift or mass effect. Ventricles normal in size and morphology without hydrocephalus. No extra-axial fluid collection. Pituitary gland and suprasellar region within normal limits. No abnormal enhancement. Vascular: Major intracranial vascular flow voids are well maintained. Skull and upper cervical spine: Craniocervical junction within normal limits. Visualized upper cervical spine demonstrates no significant finding. Bone marrow signal intensity within normal limits. No scalp soft tissue abnormality. Sinuses/Orbits: Globes and orbital soft tissues are within normal limits. Paranasal sinuses are largely clear. No significant mastoid effusion. Other: None. IMPRESSION: Normal brain MRI. No acute intracranial abnormality or findings to explain patient's symptoms. Electronically Signed   By: Rise Mu M.D.   On: 09/12/2023 00:59   CT HEAD WO CONTRAST ( ) Result Date: 09/09/2023 CLINICAL DATA:  Bilateral leg weakness. History of lupus. Progressive myalgia and paresthesia. EXAM: CT HEAD WITHOUT CONTRAST TECHNIQUE: Contiguous axial images were obtained from the base of the skull through the vertex without intravenous contrast. RADIATION DOSE REDUCTION: This exam was performed according to  the departmental dose-optimization program which includes automated exposure control, adjustment of the mA and/or kV according to patient size and/or use of iterative reconstruction technique. COMPARISON:  None Available. FINDINGS: Brain: The  brain shows a normal appearance without evidence of malformation, atrophy, old or acute small or large vessel infarction, mass lesion, hemorrhage, hydrocephalus or extra-axial collection. Vascular: No hyperdense vessel. No evidence of atherosclerotic calcification. Skull: Normal.  No traumatic finding.  No focal bone lesion. Sinuses/Orbits: Sinuses are clear. Orbits appear normal. Mastoids are clear. Other: None significant IMPRESSION: Normal head CT. Electronically Signed   By: Paulina Fusi M.D.   On: 09/09/2023 18:18   CT ABDOMEN PELVIS W CONTRAST Result Date: 09/09/2023 CLINICAL DATA:  Acute, nonlocalized abdominal pain. EXAM: CT ABDOMEN AND PELVIS WITH CONTRAST TECHNIQUE: Multidetector CT imaging of the abdomen and pelvis was performed using the standard protocol following bolus administration of intravenous contrast. RADIATION DOSE REDUCTION: This exam was performed according to the departmental dose-optimization program which includes automated exposure control, adjustment of the mA and/or kV according to patient size and/or use of iterative reconstruction technique. CONTRAST:  80mL OMNIPAQUE IOHEXOL 300 MG/ML  SOLN COMPARISON:  None Available. FINDINGS: Lower chest:  No contributory findings. Hepatobiliary: No focal liver abnormality.No evidence of biliary obstruction or stone. Pancreas: Unremarkable.  Suspect pancreas divisum. Spleen: Unremarkable. Adrenals/Urinary Tract: Negative adrenals. No hydronephrosis or stone. Unremarkable bladder. Stomach/Bowel:  No obstruction. No appendicitis. Vascular/Lymphatic: No acute vascular abnormality. No mass or adenopathy. Reproductive:No pathologic findings. Other: No ascites or pneumoperitoneum. Musculoskeletal: No acute abnormalities. IMPRESSION: Negative abdominal CT. Electronically Signed   By: Tiburcio Pea M.D.   On: 09/09/2023 04:58   MR Cervical Spine W and Wo Contrast Result Date: 09/09/2023 CLINICAL DATA:  Demyelinating disease. Increased pain for 2  weeks. Numbness from abdomen to feet with pain. EXAM: MRI CERVICAL, THORACIC AND LUMBAR SPINE WITHOUT AND WITH CONTRAST TECHNIQUE: Multiplanar and multiecho pulse sequences of the cervical spine, to include the craniocervical junction and cervicothoracic junction, and thoracic and lumbar spine, were obtained without and with intravenous contrast. CONTRAST:  4mL GADAVIST GADOBUTROL 1 MMOL/ML IV SOLN COMPARISON:  None Available. FINDINGS: MRI CERVICAL SPINE FINDINGS Alignment: Physiologic. Vertebrae: No fracture, evidence of discitis, or bone lesion. Cord: Normal in the cervical region Posterior Fossa, vertebral arteries, paraspinal tissues: No perispinal mass or inflammation. Disc levels: No degeneration or impingement. MRI THORACIC SPINE FINDINGS Alignment:  Normal Vertebrae: No fracture, evidence of discitis, or aggressive bone lesion. No bony infarct detected. T8 hemangioma. Cord: T2 hyperintensity in the cord spanning T1-T9, involving the majority of the cord centered at the midportion. No abnormal flow voids. Mild if any so swelling and no detectable enhancement (very motion degraded). The lack of swelling and enhancement argues against tumor. Spinal cord infarct is considered given the gray matter involvement and location, but more subacute symptom onset with no provided history of trauma and a positive history of lupus. Paraspinal and other soft tissues: Negative. Normal flow void in the aorta. Disc levels: No degenerative changes or impingement MRI LUMBAR SPINE FINDINGS Segmentation:  Standard. Alignment:  Physiologic. Vertebrae:  No fracture, evidence of discitis, or bone lesion. Conus medullaris and cauda equina: Conus extends to the L2 level. Conus and cauda equina appear normal. Paraspinal and other soft tissues: No perispinal mass or inflammation. 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 Electronically Signed   By: Audry Riles.D.  On: 09/09/2023 04:26   MR THORACIC SPINE W WO CONTRAST Result Date: 09/09/2023 CLINICAL DATA:  Demyelinating disease. Increased pain for 2 weeks. Numbness from abdomen to feet with pain. EXAM: MRI CERVICAL, THORACIC AND LUMBAR SPINE WITHOUT AND WITH CONTRAST TECHNIQUE: Multiplanar and multiecho pulse sequences of the cervical spine, to include the craniocervical junction and cervicothoracic junction, and thoracic and lumbar spine, were obtained without and with intravenous contrast. CONTRAST:  4mL GADAVIST GADOBUTROL 1 MMOL/ML IV SOLN COMPARISON:  None Available. FINDINGS: MRI CERVICAL SPINE FINDINGS Alignment: Physiologic. Vertebrae: No fracture, evidence of discitis, or bone lesion. Cord: Normal in the cervical region Posterior Fossa, vertebral arteries, paraspinal tissues: No perispinal mass or inflammation. Disc levels: No degeneration or impingement. MRI THORACIC SPINE FINDINGS Alignment:  Normal Vertebrae: No fracture, evidence of discitis, or aggressive bone lesion. No bony infarct detected. T8 hemangioma. Cord: T2 hyperintensity in the cord spanning T1-T9, involving the majority of the cord centered at the midportion. No abnormal flow voids. Mild if any so swelling and no detectable enhancement (very motion degraded). The lack of swelling and enhancement argues against tumor. Spinal cord infarct is considered given the gray matter involvement and location, but more subacute symptom onset with no provided history of trauma and a positive history of lupus. Paraspinal and other soft tissues: Negative. Normal flow void in the aorta. Disc levels: No degenerative changes or impingement MRI LUMBAR SPINE FINDINGS Segmentation:  Standard. Alignment:  Physiologic. Vertebrae:  No fracture, evidence of discitis, or bone lesion. Conus medullaris and cauda equina: Conus extends to the L2 level. Conus and cauda equina appear  normal. Paraspinal and other soft tissues: No perispinal mass or inflammation. 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 Electronically Signed   By: Tiburcio Pea M.D.   On: 09/09/2023 04:26   MR Lumbar Spine W Wo Contrast Result Date: 09/09/2023 CLINICAL DATA:  Demyelinating disease. Increased pain for 2 weeks. Numbness from abdomen to feet with pain. EXAM: MRI CERVICAL, THORACIC AND LUMBAR SPINE WITHOUT AND WITH CONTRAST TECHNIQUE: Multiplanar and multiecho pulse sequences of the cervical spine, to include the craniocervical junction and cervicothoracic junction, and thoracic and lumbar spine, were obtained without and with intravenous contrast. CONTRAST:  4mL GADAVIST GADOBUTROL 1 MMOL/ML IV SOLN COMPARISON:  None Available. FINDINGS: MRI CERVICAL SPINE FINDINGS Alignment: Physiologic. Vertebrae: No fracture, evidence of discitis, or bone lesion. Cord: Normal in the cervical region Posterior Fossa, vertebral arteries, paraspinal tissues: No perispinal mass or inflammation. Disc levels: No degeneration or impingement. MRI THORACIC SPINE FINDINGS Alignment:  Normal Vertebrae: No fracture, evidence of discitis, or aggressive bone lesion. No bony infarct detected. T8 hemangioma. Cord: T2 hyperintensity in the cord spanning T1-T9, involving the majority of the cord centered at the midportion. No abnormal flow voids. Mild if any so swelling and no detectable enhancement (very motion degraded). The lack of swelling and enhancement argues against tumor. Spinal cord infarct is considered given the gray matter involvement and location, but more subacute symptom onset with no provided history of trauma and a positive history of lupus. Paraspinal and other soft tissues: Negative. Normal flow void in the aorta. Disc levels: No degenerative changes or impingement MRI  LUMBAR SPINE FINDINGS Segmentation:  Standard. Alignment:  Physiologic. Vertebrae:  No fracture, evidence of discitis, or bone lesion. Conus medullaris and cauda equina: Conus extends to the L2 level. Conus and cauda equina appear normal. Paraspinal and other  soft tissues: No perispinal mass or inflammation. 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 Electronically Signed   By: Tiburcio Pea M.D.   On: 09/09/2023 04:26   (Echo, Carotid, EGD, Colonoscopy, ERCP)    Subjective: Feels great no complaints  Discharge Exam: Vitals:   09/16/23 0430 09/16/23 0753  BP:  123/89  Pulse: 73 98  Resp:  20  Temp: 98.6 F (37 C) 98.2 F (36.8 C)  SpO2: 100% 100%   Vitals:   09/15/23 0622 09/15/23 2000 09/16/23 0430 09/16/23 0753  BP: 117/85 112/88  123/89  Pulse: 86 89 73 98  Resp:    20  Temp: 98.2 F (36.8 C) 98.1 F (36.7 C) 98.6 F (37 C) 98.2 F (36.8 C)  TempSrc: Oral Oral Oral Oral  SpO2: 100% 100% 100% 100%  Weight:      Height:        General: Pt is alert, awake, not in acute distress Cardiovascular: RRR, S1/S2 +, no rubs, no gallops Respiratory: CTA bilaterally, no wheezing, no rhonchi Abdominal: Soft, NT, ND, bowel sounds + Extremities: no edema, no cyanosis    The results of significant diagnostics from this hospitalization (including imaging, microbiology, ancillary and laboratory) are listed below for reference.     Microbiology: Recent Results (from the past 240 hours)  VZV PCR, CSF     Status: None   Collection Time: 09/09/23  5:29 AM   Specimen: Cerebrospinal Fluid  Result Value Ref Range Status   VZV PCR, CSF Negative Negative Final    Comment: (NOTE) No Varicella Zoster Virus DNA detected. Performed At: [facility] [address] [place], [place] [id number] Jolene Schimke MD WU:[id number]       Labs: BNP (last 3 results) No results for input(s): "BNP" in the last 8760 hours. Basic Metabolic Panel: Recent Labs  Lab 09/10/23 0325 09/14/23 0432 09/15/23 0548  NA 135 132* 134*  K 4.1 3.9 3.1*  CL 105 102 102  CO2 22 21* 24  GLUCOSE 111* 135* 92  BUN 7 18 17   CREATININE 0.53 0.72 0.67  CALCIUM 9.9 9.7 9.5   Liver Function Tests: Recent Labs  Lab 09/10/23 0325  AST 12*  ALT 11  ALKPHOS 50  BILITOT 0.5  PROT 8.8*  ALBUMIN 3.9   No results for input(s): "LIPASE", "AMYLASE" in the last 168 hours. No results for input(s): "AMMONIA" in the last 168 hours. CBC: Recent Labs  Lab 09/10/23 0325 09/14/23 0432 09/15/23 0548  WBC 8.1 6.6 7.0  HGB 11.9* 12.1 12.2  HCT 36.7 37.4 37.5  MCV 85.0 84.4 85.4  PLT 348 384 365   Cardiac Enzymes: No results for input(s): "CKTOTAL", "CKMB", "CKMBINDEX", "TROPONINI" in the last 168 hours. BNP: Invalid input(s): "POCBNP" CBG: No results for input(s): "GLUCAP" in the last 168 hours. D-Dimer No results for input(s): "DDIMER" in the last 72 hours. Hgb A1c No results for input(s): "HGBA1C" in the last 72 hours. Lipid Profile No results for input(s): "CHOL", "HDL", "LDLCALC", "TRIG", "CHOLHDL", "LDLDIRECT" in the last 72 hours. Thyroid function studies No results for input(s): "TSH", "T4TOTAL", "T3FREE", "THYROIDAB" in the last 72 hours.  Invalid input(s): "FREET3" Anemia work up Recent Labs    09/14/23 0432  VITAMINB12 470   Urinalysis    Component Value Date/Time   COLORURINE YELLOW 09/09/2023 0211   APPEARANCEUR CLEAR 09/09/2023 0211   LABSPEC >1.046 (H) 09/09/2023 0211  PHURINE 6.0 09/09/2023 0211   GLUCOSEU NEGATIVE 09/09/2023 0211   HGBUR NEGATIVE 09/09/2023 0211   BILIRUBINUR NEGATIVE 09/09/2023 0211   BILIRUBINUR neg 05/10/2015 1429   KETONESUR 5 (A) 09/09/2023 0211   PROTEINUR NEGATIVE 09/09/2023 0211   UROBILINOGEN 0.2 07/05/2022 1433   NITRITE NEGATIVE 09/09/2023 0211   LEUKOCYTESUR NEGATIVE  09/09/2023 0211   Sepsis Labs Recent Labs  Lab 09/10/23 0325 09/14/23 0432 09/15/23 0548  WBC 8.1 6.6 7.0   Microbiology Recent Results (from the past 240 hours)  VZV PCR, CSF     Status: None   Collection Time: 09/09/23  5:29 AM   Specimen: Cerebrospinal Fluid  Result Value Ref Range Status   VZV PCR, CSF Negative Negative Final    Comment: (NOTE) No Varicella Zoster Virus DNA detected. Performed At: [facility] [address] [place], [place] [id number] Jolene Schimke MD QM:[id number]      Time coordinating discharge: Over 35 minutes  SIGNED:   Marinda Elk, MD  [facility] 09/16/2023, 9:49 AM Pager   If 7PM-7AM, please contact night-coverage [email or web address] Password [email or web address]

## 2023-09-16 NOTE — H&P (Shared)
Physical Medicine and Rehabilitation Admission H&P    Chief Complaint  Patient presents with   Functional deficits due to transverse myelitis     HPI: Stacy Kelley is a 32 year old female with history of SLE--non compliant w/plaquenil, Sjogren's syndrome, genital herpes, GC, Vit D deficiency; who was admitted on 09/09/23 with 8 day history of BLE numbness and excruciating abdominal pain with weakness. 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 LP done revealing glucose 40 and protein 52 with elevated CSF IgG-11.3 and serum IgG-2050. Dr. Otelia Limes felt that work up c/w acute transverse myelitis and he was treated Solumedrol X 5 days. She has had issues with nausea and abdominal pain with concerns of GB disease and ultrasound was negative for GB wall thickening, stones or Murphy sign. PPI increased to BID and gabapentin recommended to help manage sensory deficits with ongoing weakness. MRI brain was negative for acute abnormality.   Dr. Amada Jupiter recommended vitamin D supplementation as well as plasmapheresis but patient reported resolution of pain but continues to have numbness with pain in side but has gotten better. She continues to have numbness with inability to feel urge to defecate or urinate. She declined this due to  improvement in symptoms and wanted to wait and see if she could recover with therapy.  She developed drainage from right axilla and was started on Bactrim on 12/17. She feels that she is getting too much medications and has been refusing multiple meds. She developed petechial lesions bilateral feet overnight felt to be due to septra, she is concerned about her weight loss as well as inability to feel or push to have BM or to urinate.  Therapy has been working with patient who currently requires CGA to Min assist. She was independent PTA and CIR recommended due to functional decline.     Review of Systems  Constitutional:  Positive for weight loss.  HENT:  Negative for hearing loss.   Eyes:  Negative for blurred vision and double vision.  Respiratory:  Negative for cough and shortness of breath.   Cardiovascular:  Positive for leg swelling. Negative for chest pain.  Gastrointestinal:  Positive for constipation. Negative for heartburn.       Unable to feel urge to or push to defecate  Genitourinary:        Cannot feel ability to urinate/urine as it comes out  Musculoskeletal:  Positive for myalgias.  Neurological:  Positive for sensory change (numb from mid chest down.) and weakness.  Psychiatric/Behavioral:  Positive for depression. The patient is nervous/anxious.     Past Medical History:  Diagnosis Date   Genital herpes    Gonorrhea    Sjogren's syndrome (HCC) 05/12/2022   SLE (systemic lupus erythematosus) (HCC)    Thyromegaly 09/03/2022   Vitamin D deficiency 06/11/2022    Past Surgical History:  Procedure Laterality Date   CESAREAN SECTION      Family History  Problem Relation Age of Onset   Diabetes Maternal Grandmother    Diabetes Maternal Grandfather    Diabetes Paternal Grandmother    Hypertension Paternal Grandfather    Diabetes Paternal Grandfather     Social History:  Lives with 2 children and was independent PTA. She reports that she has never smoked. She has never used smokeless tobacco. She reports that she smokes marijuana every other week. She does not use any alcohol does not drink alcohol and  does not use drugs.   Allergies  Allergen Reactions   Prednisone Other (See Comments)    Feet became swollen and blotchy rashes appeared on the feet shortly after starting this   Augmentin [Amoxicillin-Pot Clavulanate] Swelling and Rash   Doxycycline Swelling, Rash and Other (See Comments)    Feet became swollen and blotchy rashes appeared on the feet shortly after starting this   Penicillins Swelling, Rash and Other (See Comments)     Feet became swollen and blotchy rashes appeared on the feet shortly after starting this    Medications Prior to Admission  Medication Sig Dispense Refill   docusate sodium (COLACE) 100 MG capsule Take 100 mg by mouth 2 (two) times daily as needed for mild constipation.     ferrous gluconate (FERGON) 324 MG tablet Take 324 mg by mouth daily as needed (for low iron).     hydrocortisone (ANUSOL-HC) 2.5 % rectal cream Place 1 application  rectally 2 (two) times daily as needed for hemorrhoids.     hydroxychloroquine (PLAQUENIL) 200 MG tablet Take 200 mg by mouth daily.     ibuprofen (ADVIL) 800 MG tablet Take 800 mg by mouth every 8 (eight) hours as needed for mild pain (pain score 1-3) or headache.     potassium chloride SA (KLOR-CON M) 20 MEQ tablet Take 2 tablets (40 mEq total) by mouth daily for 3 days. (Patient not taking: Reported on 09/09/2023) 6 tablet 0    Home: Home Living Family/patient expects to be discharged to:: Private residence Living Arrangements: Other (Comment), Children (children's father) Available Help at Discharge: Family, Available 24 hours/day Type of Home: Apartment Home Access: Stairs to enter Secretary/administrator of Steps: flight Entrance Stairs-Rails: Right, Left Home Layout: One level Bathroom Shower/Tub: Associate Professor: Yes Home Equipment: None   Functional History: Prior Function Prior Level of Function : Independent/Modified Independent, Driving  Functional Status:  Mobility: Bed Mobility Overal bed mobility: Needs Assistance Bed Mobility: Sit to Supine Supine to sit: Supervision, HOB elevated Sit to supine: Supervision, HOB elevated General bed mobility comments: OOB in chair upon entry Transfers Overall transfer level: Needs assistance Equipment used: None Transfers: Sit to/from Stand Sit to Stand: Contact guard assist Bed to/from chair/wheelchair/BSC transfer type:: Step pivot, Stand  pivot Stand pivot transfers: Min assist Step pivot transfers: Contact guard assist General transfer comment: CGA for safety, uses B UE to push from recliner Ambulation/Gait Ambulation/Gait assistance: Min assist Gait Distance (Feet): 30 Feet Assistive device: None, 1 person hand held assist Gait Pattern/deviations: Step-through pattern, Decreased stride length General Gait Details: short steps w/ HHA for increased stability. Cues for increased step length Gait velocity: dec Gait velocity interpretation: <1.8 ft/sec, indicate of risk for recurrent falls    ADL: ADL Overall ADL's : Needs assistance/impaired Eating/Feeding: Independent, Sitting Grooming: Wash/dry hands, Wash/dry face, Oral care, Supervision/safety, Standing Grooming Details (indicate cue type and reason): at sink Upper Body Bathing: Set up, Sitting Lower Body Bathing: Minimal assistance, Sit to/from stand Upper Body Dressing : Set up, Sitting Upper Body Dressing Details (indicate cue type and reason): gown to cover back Lower Body Dressing: Contact guard assist, Sit to/from stand Toilet Transfer: Minimal assistance, Ambulation, Rolling walker (2 wheels), Grab bars, Comfort height toilet Toileting- Clothing Manipulation and Hygiene: Contact guard assist, Sit to/from stand  Cognition: Cognition Overall Cognitive Status: Within Functional Limits for tasks assessed Orientation Level: Oriented X4 Cognition Arousal: Alert Behavior During Therapy: WFL for tasks assessed/performed Overall Cognitive Status: Within  Functional Limits for tasks assessed General Comments: pleased to be more mobile and less pain   Blood pressure 123/89, pulse 98, temperature 98.2 F (36.8 C), temperature source Oral, resp. rate 20, height 5\' 2"  (1.575 m), weight 43.1 kg, last menstrual period 11/14/2022, SpO2 100%. Physical Exam Vitals and nursing note reviewed.  Constitutional:      Appearance: Normal appearance. She is underweight.   Skin:    Comments: Right breast with quarter size abrasion on outer aspect. Multiple areas of discoloration on bilateral shins, breasts and feet.   Neurological:     Mental Status: She is alert.     Results for orders placed or performed during the hospital encounter of 09/09/23 (from the past 48 hours)  Basic metabolic panel     Status: Abnormal   Collection Time: 09/15/23  5:48 AM  Result Value Ref Range   Sodium 134 (L) 135 - 145 mmol/L   Potassium 3.1 (L) 3.5 - 5.1 mmol/L   Chloride 102 98 - 111 mmol/L   CO2 24 22 - 32 mmol/L   Glucose, Bld 92 70 - 99 mg/dL    Comment: Glucose reference range applies only to samples taken after fasting for at least 8 hours.   BUN 17 6 - 20 mg/dL   Creatinine, Ser 7.82 0.44 - 1.00 mg/dL   Calcium 9.5 8.9 - 95.6 mg/dL   GFR, Estimated >21 >30 mL/min    Comment: (NOTE) Calculated using the CKD-EPI Creatinine Equation (2021)    Anion gap 8 5 - 15    Comment: Performed at [facility], [address] [place], [place] [address]  CBC     Status: None   Collection Time: 09/15/23  5:48 AM  Result Value Ref Range   WBC 7.0 4.0 - 10.5 K/uL   RBC 4.39 3.87 - 5.11 MIL/uL   Hemoglobin 12.2 12.0 - 15.0 g/dL   HCT 46.9 62.9 - 52.8 %   MCV 85.4 80.0 - 100.0 fL   MCH 27.8 26.0 - 34.0 pg   MCHC 32.5 30.0 - 36.0 g/dL   RDW 41.3 24.4 - 01.0 %   Platelets 365 150 - 400 K/uL   nRBC 0.0 0.0 - 0.2 %    Comment: Performed at [facility], [address], [place], [place] [address]  Protime-INR     Status: None   Collection Time: 09/15/23  5:48 AM  Result Value Ref Range   Prothrombin Time 14.4 11.4 - 15.2 seconds   INR 1.1 0.8 - 1.2    Comment: (NOTE) INR goal varies based on device and disease states. Performed at [facility], [address] [place], [place] [address]    No results found.    Blood pressure 123/89, pulse 98, temperature 98.2 F (36.8 C), temperature source Oral, resp. rate 20, height 5\' 2"  (1.575 m), weight  43.1 kg, last menstrual period 11/14/2022, SpO2 100%.  Medical Problem List and Plan: 1. Functional deficits secondary to ***  -patient may *** shower  -ELOS/Goals: *** 2.  Antithrombotics: -DVT/anticoagulation:  Pharmaceutical: Lovenox added today  -antiplatelet therapy: N/A 3. Pain Management:  tylenol prn.  4. Mood/Behavior/Sleep:  LCSW to follow for evaluation and support.   -antipsychotic agents: N/A  --has been unable to sleep  (anxiety?).  --Patient aware that Melatonin added prn for sleep  as well as gabapentin prn for pain/neuropathy.  5. Neuropsych/cognition: This patient is capable of making decisions on her own behalf. 6. Skin/Wound Care:  Routine pressure relief measures.  7. Fluids/Electrolytes/Nutrition: Monitor I/O. Check CMET in am 8. SLE/Sjogren's: Now back on Plaquenil as is willing to take it.   --discussed importance of compliance and also will work on simplifying medication regimen.  9. Vitamin D deficiency @ 10.32: Will change Vit D3 to Ergocalciferol due to severity of deficiency.  10. Recurrent right axilla hidradenitis: Had episode last month that was drained in ED but did not take atbx due to concerns of SE.  --On Bactrim dose #2 but developed petechiae last night. Willing to take it with benadryl premedication but would like it drained before it gets big again --Consult surgery for I & D? 11. Neurogenic bowel: Will check KUB--start bowel program with suppository at nights 12. Neurogenic bladder: Discussed toileting every 4 hours and DV/Coude bladder  --Monitor voiding with PVR checks.  13. 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  --Protonix and Carafate was added but has been refusing it so will d/c 14.. Hypokalemia: Has been repleted. Recheck in am--refused labs today 15.Marland Kitchen Hyponatremia: Will check follow up labs in am.  16..  Hx B12 deficiency:470--> WNL on recheck.         ***  Jacquelynn Cree, PA-C 09/16/2023

## 2023-09-16 NOTE — Plan of Care (Signed)
  Problem: Education: Goal: Knowledge of General Education information will improve Description: Including pain rating scale, medication(s)/side effects and non-pharmacologic comfort measures Outcome: Progressing   Problem: Health Behavior/Discharge Planning: Goal: Ability to manage health-related needs will improve Outcome: Progressing   Problem: Clinical Measurements: Goal: Ability to maintain clinical measurements within normal limits will improve Outcome: Progressing Goal: Will remain free from infection Outcome: Progressing Goal: Diagnostic test results will improve Outcome: Progressing Goal: Respiratory complications will improve Outcome: Progressing Goal: Cardiovascular complication will be avoided Outcome: Progressing   Problem: Elimination: Goal: Will not experience complications related to bowel motility Outcome: Progressing Goal: Will not experience complications related to urinary retention Outcome: Progressing   Problem: Pain Management: Goal: General experience of comfort will improve Outcome: Progressing   Problem: Safety: Goal: Ability to remain free from injury will improve Outcome: Progressing

## 2023-09-16 NOTE — Progress Notes (Signed)
Physical Therapy Treatment Patient Details Name: Stacy Kelley MRN: [id number] DOB: [birth year] Today's Date: 09/16/2023   History of Present Illness 32 y.o. female presents to [facility] hospital on 09/09/2023 with BLE weakness, numbness, pain, incontinence. 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 PMH includes SLE, genital herpes, substance use disorder, lupus    PT Comments  Pt in recliner upon arrival and agreeable to PT session. Pt reports fatigue from earlier therapy session with slight dizziness (BP 126/96, 107). Pt was able to tolerate a few LE exercises in standing and short distance ambulation in the room. Pt continues to prefer 1 UE hold for increased stability when ambulating. Pt reports feeling limited with ambulation due to L LE numbness. Limited therapy session as pt did not feel improvements in lightheadedness and requested to return to supine. Pt is progressing towards goals. Current d/c recs remain appropriate to help pt return to previous level of independence. Acute PT to follow.      If plan is discharge home, recommend the following: Assistance with cooking/housework;Assist for transportation;Help with stairs or ramp for entrance;A little help with walking and/or transfers;A little help with bathing/dressing/bathroom   Can travel by private vehicle      Yes  Equipment Recommendations  None recommended by PT       Precautions / Restrictions Precautions Precautions: Fall Restrictions Weight Bearing Restrictions Per Provider Order: No     Mobility  Bed Mobility   Bed Mobility: Sit to Supine    Sit to supine: Supervision, HOB elevated        Transfers Overall transfer level: Needs assistance Equipment used: None Transfers: Sit to/from Stand Sit to Stand: Contact guard assist    General transfer comment: CGA for safety, uses B UE to push from recliner    Ambulation/Gait Ambulation/Gait assistance: Min assist Gait Distance (Feet): 30 Feet Assistive device: None, 1  person hand held assist Gait Pattern/deviations: Step-through pattern, Decreased stride length Gait velocity: dec     General Gait Details: short steps w/ HHA for increased stability. Cues for increased step length      Balance Overall balance assessment: Needs assistance Sitting-balance support: No upper extremity supported, Feet supported Sitting balance-Leahy Scale: Good     Standing balance support: No upper extremity supported, During functional activity Standing balance-Leahy Scale: Fair Standing balance comment: able to stand w/ no UE support, prefers 1 UE hold for balance when ambulating       Cognition Arousal: Alert Behavior During Therapy: WFL for tasks assessed/performed Overall Cognitive Status: Within Functional Limits for tasks assessed        Exercises Other Exercises Other Exercises: 5 serial STS Other Exercises: B X10 marches in standing w/ B UE support Other Exercises: B x5 standing hip ABD    General Comments General comments (skin integrity, edema, etc.): pt reported feeling dizzy upon ambulating, BP 126/95, 107. HR increases to max 125 BPM with exercises and returns to 90s with rest. RN present and notified      Pertinent Vitals/Pain Pain Assessment Pain Assessment: Faces Faces Pain Scale: Hurts little more Pain Location: back Pain Descriptors / Indicators: Sore Pain Intervention(s): Limited activity within patient's tolerance, Monitored during session, Repositioned, Premedicated before session     PT Goals (current goals can now be found in the care plan section) Acute Rehab PT Goals Patient Stated Goal: to return to independence PT Goal Formulation: With patient Time For Goal Achievement: 09/25/23 Potential to Achieve Goals: Good Progress towards PT goals: Progressing toward goals  Frequency    Min 1X/week       AM-PAC PT "6 Clicks" Mobility   Outcome Measure  Help needed turning from your back to your side while in a flat bed  without using bedrails?: None Help needed moving from lying on your back to sitting on the side of a flat bed without using bedrails?: None Help needed moving to and from a bed to a chair (including a wheelchair)?: A Little Help needed standing up from a chair using your arms (e.g., wheelchair or bedside chair)?: A Little Help needed to walk in hospital room?: A Little Help needed climbing 3-5 steps with a railing? : A Little 6 Click Score: 20    End of Session Equipment Utilized During Treatment: Gait belt Activity Tolerance: Patient tolerated treatment well Patient left: with call bell/phone within reach;in bed Nurse Communication: Mobility status (HR increases, dizziness and BP readings) PT Visit Diagnosis: Other abnormalities of gait and mobility (R26.89);Muscle weakness (generalized) (M62.81);Other symptoms and signs involving the nervous system (R29.898)     Time: 1191-4782 PT Time Calculation (min) (ACUTE ONLY): 19 min  Charges:    $Therapeutic Exercise: 8-22 mins PT General Charges $$ ACUTE PT VISIT: 1 Visit                     Hilton Cork, PT, DPT Secure Chat Preferred  Rehab Office [phone]   Arturo Morton Brion Aliment 09/16/2023, 10:40 AM

## 2023-09-16 NOTE — Progress Notes (Signed)
Pt requested lab to return later this A.M.

## 2023-09-16 NOTE — Progress Notes (Signed)
[facility] PROGRESS NOTE    Progress Note  DAYLEN SHERK  JWJ:[id number] DOB: 1991/01/04 DOA: 09/09/2023 PCP: Jordan Hawks, PA-C     Brief Narrative:   LAWANDA LILEY is an 32 y.o. 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  HIV and RPR were negative.  Viral workup was also negative. Neurology was consulted completed 5-day course of steroids with minimal improvement. Plex deferred. Continue vitamin D supplementation further management per neurology. CSF was unremarkable. PT OT evaluated the patient recommended acute inpatient rehab. Consult inpatient rehab.  Patient is medically stable to be transferred to inpatient rehab.  Right armpit drainage: Tender is improving, continue on Bactrim is currently draining serosanguineous fluid.  Hypokalemia: Repleted, she refused basic metabolic panel this morning.  Vitamin D deficiency: Continue oral supplementation.  Iron deficiency anemia Continue iron supplementation.  Systemic lupus erythematosus (HCC) Poorly compliant with her medication Plaquenil as an outpatient. She received 5-day course of steroids follow-up with rheumatology as an outpatient. Continue Plaquenil orally.  Skin rash: Lower extremity rash started on Benadryl.  Abdominal pain nausea and vomiting: Suspect due to high-dose steroid started on PPI and Carafate. Abdominal ultrasound was unremarkable.  DVT prophylaxis: lovenox Family Communication:none Status is:  Inpatient Remains inpatient appropriate because: Acute transverse myelitis    Code Status:     Code Status Orders  (From admission, onward)           Start     Ordered   09/09/23 0937  Full code  Continuous       Question:  By:  Answer:  Consent: discussion documented in EHR   09/09/23 0938           Code Status History     Date Active Date Inactive Code Status Order ID Comments User Context   05/11/2015 2055 05/13/2015 1738 Full Code [id number]  Roe Coombs CNM Inpatient   05/11/2015 1528 05/11/2015 2055 Full Code [id number]  Roe Coombs, CNM Inpatient         IV Access:   Peripheral IV   Procedures and diagnostic studies:   No results found.    Medical Consultants:   None.   Subjective:    ADYLYNN GANS feels better had a bowel movement.  Objective:    Vitals:   09/15/23 0622 09/15/23 2000 09/16/23 0430 09/16/23 0753  BP: 117/85 112/88  123/89  Pulse: 86 89 73 98  Resp:    20  Temp: 98.2 F (36.8 C) 98.1 F (36.7 C) 98.6 F (37 C) 98.2 F (36.8 C)  TempSrc: Oral Oral Oral Oral  SpO2: 100% 100% 100% 100%  Weight:      Height:       SpO2: 100 %   Intake/Output Summary (Last 24 hours) at 09/16/2023 0910 Last data filed at 09/15/2023 2100 Gross per 24 hour  Intake 480 ml  Output --  Net 480 ml   Filed Weights   09/09/23 0006  Weight: 43.1 kg    Exam: General exam: In no acute distress. Respiratory system: Good air movement and clear  to auscultation. Cardiovascular system: S1 & S2 heard, RRR. No JVD. Gastrointestinal system: Abdomen is nondistended, soft and nontender.  Extremities: No pedal edema. Skin: No rashes, lesions or ulcers Psychiatry: Judgement and insight appear normal. Mood & affect appropriate.  Data Reviewed:    Labs: Basic Metabolic Panel: Recent Labs  Lab 09/10/23 0325 09/14/23 0432 09/15/23 0548  NA 135 132* 134*  K 4.1 3.9 3.1*  CL 105 102 102  CO2 22 21* 24  GLUCOSE 111* 135* 92   BUN 7 18 17   CREATININE 0.53 0.72 0.67  CALCIUM 9.9 9.7 9.5   GFR Estimated Creatinine Clearance: 69.3 mL/min (by C-G formula based on SCr of 0.67 mg/dL). Liver Function Tests: Recent Labs  Lab 09/10/23 0325  AST 12*  ALT 11  ALKPHOS 50  BILITOT 0.5  PROT 8.8*  ALBUMIN 3.9   No results for input(s): "LIPASE", "AMYLASE" in the last 168 hours.  No results for input(s): "AMMONIA" in the last 168 hours. Coagulation profile Recent Labs  Lab 09/14/23 0548 09/15/23 0548  INR 1.1 1.1   COVID-19 Labs  No results for input(s): "DDIMER", "FERRITIN", "LDH", "CRP" in the last 72 hours.  Lab Results  Component Value Date   SARSCOV2NAA NEGATIVE 04/10/2020    CBC: Recent Labs  Lab 09/10/23 0325 09/14/23 0432 09/15/23 0548  WBC 8.1 6.6 7.0  HGB 11.9* 12.1 12.2  HCT 36.7 37.4 37.5  MCV 85.0 84.4 85.4  PLT 348 384 365   Cardiac Enzymes: No results for input(s): "CKTOTAL", "CKMB", "CKMBINDEX", "TROPONINI" in the last 168 hours. BNP (last 3 results) No results for input(s): "PROBNP" in the last 8760 hours. CBG: No results for input(s): "GLUCAP" in the last 168 hours. D-Dimer: No results for input(s): "DDIMER" in the last 72 hours. Hgb A1c: No results for input(s): "HGBA1C" in the last 72 hours. Lipid Profile: No results for input(s): "CHOL", "HDL", "LDLCALC", "TRIG", "CHOLHDL", "LDLDIRECT" in the last 72 hours. Thyroid function studies: No results for input(s): "TSH", "T4TOTAL", "T3FREE", "THYROIDAB" in the last 72 hours.  Invalid input(s): "FREET3" Anemia work up: Recent Labs    09/14/23 0432  VITAMINB12 470   Sepsis Labs: Recent Labs  Lab 09/10/23 0325 09/14/23 0432 09/15/23 0548  WBC 8.1 6.6 7.0   Microbiology Recent Results (from the past 240 hours)  VZV PCR, CSF     Status: None   Collection Time: 09/09/23  5:29 AM   Specimen: Cerebrospinal Fluid  Result Value Ref Range Status   VZV PCR, CSF Negative Negative Final    Comment: (NOTE) No  Varicella Zoster Virus DNA detected. Performed At: [facility] [address] [place], [place] [id number] Jolene Schimke MD WU:[id number]      Medications:    cholecalciferol  1,000 Units Oral Daily   feeding supplement  237 mL Oral BID BM   ferrous gluconate  324 mg Oral Daily   hydroxychloroquine  200 mg Oral Daily   pantoprazole  40 mg Oral BID   potassium chloride  40 mEq Oral BID   senna-docusate  1 tablet Oral BID   sucralfate  1 g Oral TID WC & HS   sulfamethoxazole-trimethoprim  1 tablet Oral Q12H   Continuous Infusions:    LOS: 7 days   Marinda Elk  [facility]  09/16/2023, 9:10 AM

## 2023-09-16 NOTE — Progress Notes (Signed)
Inpatient Rehabilitation Admissions Coordinator   I have insurance approval and CIR bed to admit her to today. I met with patient at bedside and she is in agreement. Acute team, and TOC made aware. I will make the arrangements.  Ottie Glazier, RN, MSN Rehab Admissions Coordinator [phone] 09/16/2023 10:13 AM

## 2023-09-16 NOTE — Progress Notes (Signed)
Occupational Therapy Treatment Patient Details Name: Stacy Kelley MRN: [id number] DOB: December 08, 1990 Today's Date: 09/16/2023   History of present illness 32 y.o. female presents to [facility] hospital on 09/09/2023 with BLE weakness, numbness, pain, incontinence. 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 PMH includes SLE, genital herpes, substance use disorder, lupus   OT comments  Patient demonstrating good gains with OT treatment with setup for UB dressing and supervision to CGA for LB dressing. Patient instructed in BUE strengthening exercises with therapy band exercises to increased functional strength and activity tolerance. Patient will benefit from intensive inpatient follow up therapy, >3 hours/day for continued OT to increase independence and safety with self care.       If plan is discharge home, recommend the following:  A little help with walking and/or transfers;A little help with bathing/dressing/bathroom;Assistance with cooking/housework;Help with stairs or ramp for entrance;Assist for transportation   Equipment Recommendations  Tub/shower bench;BSC/3in1    Recommendations for Other Services Rehab consult    Precautions / Restrictions Precautions Precautions: Fall Restrictions Weight Bearing Restrictions Per Provider Order: No       Mobility Bed Mobility Overal bed mobility: Needs Assistance             General bed mobility comments: OOB in chair upon entry    Transfers Overall transfer level: Needs assistance Equipment used: None Transfers: Sit to/from Stand Sit to Stand: Contact guard assist           General transfer comment: CGA for safety     Balance Overall balance assessment: Needs assistance Sitting-balance support: No upper extremity supported, Feet supported Sitting balance-Leahy Scale: Good     Standing balance support: No upper extremity supported, During functional activity Standing balance-Leahy Scale: Fair Standing balance comment: HHA for  safety and balance with dynamic standing                           ADL either performed or assessed with clinical judgement   ADL Overall ADL's : Needs assistance/impaired                 Upper Body Dressing : Set up;Sitting Upper Body Dressing Details (indicate cue type and reason): to donn pullover top and robe Lower Body Dressing: Supervision/safety;Sitting/lateral leans Lower Body Dressing Details (indicate cue type and reason): to change socks                    Extremity/Trunk Assessment              Vision       Perception     Praxis      Cognition Arousal: Alert Behavior During Therapy: WFL for tasks assessed/performed Overall Cognitive Status: Within Functional Limits for tasks assessed                                          Exercises Exercises: General Upper Extremity General Exercises - Upper Extremity Shoulder Flexion: Strengthening, 10 reps, Both, Seated, Theraband Theraband Level (Shoulder Flexion): Level 1 (Yellow) Shoulder ABduction: Strengthening, Both, 10 reps, Seated, Theraband Theraband Level (Shoulder Abduction): Level 2 (Red) Shoulder Horizontal ABduction: Strengthening, Both, 10 reps, Seated, Theraband Theraband Level (Shoulder Horizontal Abduction): Level 2 (Red)    Shoulder Instructions       General Comments pt reported feeling dizzy upon ambulating, BP 126/95, 107. HR increases to max  125 BPM with exercises and returns to 90s with rest. RN present and notified    Pertinent Vitals/ Pain       Pain Assessment Pain Assessment: Faces Faces Pain Scale: Hurts little more Pain Location: back Pain Descriptors / Indicators: Sore Pain Intervention(s): Limited activity within patient's tolerance, Monitored during session, Repositioned  Home Living                                          Prior Functioning/Environment              Frequency  Min 1X/week         Progress Toward Goals  OT Goals(current goals can now be found in the care plan section)  Progress towards OT goals: Progressing toward goals  Acute Rehab OT Goals Patient Stated Goal: go to rehab OT Goal Formulation: With patient Time For Goal Achievement: 09/26/23 Potential to Achieve Goals: Good ADL Goals Pt Will Perform Grooming: with modified independence;sitting;standing Pt Will Perform Upper Body Bathing: with modified independence;sitting Pt Will Perform Lower Body Bathing: with modified independence;sit to/from stand Pt Will Perform Upper Body Dressing: with modified independence;sitting Pt Will Perform Lower Body Dressing: with modified independence;sit to/from stand Pt Will Transfer to Toilet: with modified independence;ambulating;bedside commode Pt Will Perform Toileting - Clothing Manipulation and hygiene: with modified independence;sit to/from stand Pt Will Perform Tub/Shower Transfer: Tub transfer;with modified independence;ambulating;tub bench;rolling walker Additional ADL Goal #1: pt will be Mod I in and OOB for basic ADLs (increased time, HOB flat, no rail  Plan      Co-evaluation                 AM-PAC OT "6 Clicks" Daily Activity     Outcome Measure   Help from another person eating meals?: None Help from another person taking care of personal grooming?: A Little Help from another person toileting, which includes using toliet, bedpan, or urinal?: A Little Help from another person bathing (including washing, rinsing, drying)?: A Little Help from another person to put on and taking off regular upper body clothing?: A Little Help from another person to put on and taking off regular lower body clothing?: A Little 6 Click Score: 19    End of Session Equipment Utilized During Treatment: Gait belt  OT Visit Diagnosis: Unsteadiness on feet (R26.81);Other abnormalities of gait and mobility (R26.89);Muscle weakness (generalized) (M62.81);Pain   Activity  Tolerance Patient tolerated treatment well   Patient Left in chair;with call bell/phone within reach   Nurse Communication Mobility status        Time: 8657-8469 OT Time Calculation (min): 33 min  Charges: OT General Charges $OT Visit: 1 Visit OT Treatments $Self Care/Home Management : 8-22 mins $Therapeutic Exercise: 8-22 mins  Alfonse Flavors, OTA Acute Rehabilitation Services  Office [phone]   Dewain Penning 09/16/2023, 2:30 PM

## 2023-09-17 DIAGNOSIS — G373 Acute transverse myelitis in demyelinating disease of central nervous system: Secondary | ICD-10-CM | POA: Diagnosis not present

## 2023-09-17 LAB — CBC WITH DIFFERENTIAL/PLATELET
Abs Immature Granulocytes: 0.04 10*3/uL (ref 0.00–0.07)
Basophils Absolute: 0 10*3/uL (ref 0.0–0.1)
Basophils Relative: 0 %
Eosinophils Absolute: 0 10*3/uL (ref 0.0–0.5)
Eosinophils Relative: 1 %
HCT: 39.6 % (ref 36.0–46.0)
Hemoglobin: 13.1 g/dL (ref 12.0–15.0)
Immature Granulocytes: 1 %
Lymphocytes Relative: 26 %
Lymphs Abs: 1.4 10*3/uL (ref 0.7–4.0)
MCH: 27.6 pg (ref 26.0–34.0)
MCHC: 33.1 g/dL (ref 30.0–36.0)
MCV: 83.5 fL (ref 80.0–100.0)
Monocytes Absolute: 0.3 10*3/uL (ref 0.1–1.0)
Monocytes Relative: 6 %
Neutro Abs: 3.6 10*3/uL (ref 1.7–7.7)
Neutrophils Relative %: 66 %
Platelets: 319 10*3/uL (ref 150–400)
RBC: 4.74 MIL/uL (ref 3.87–5.11)
RDW: 13.8 % (ref 11.5–15.5)
WBC: 5.4 10*3/uL (ref 4.0–10.5)
nRBC: 0 % (ref 0.0–0.2)

## 2023-09-17 LAB — COMPREHENSIVE METABOLIC PANEL
ALT: 19 U/L (ref 0–44)
AST: 13 U/L — ABNORMAL LOW (ref 15–41)
Albumin: 3.6 g/dL (ref 3.5–5.0)
Alkaline Phosphatase: 50 U/L (ref 38–126)
Anion gap: 7 (ref 5–15)
BUN: 9 mg/dL (ref 6–20)
CO2: 21 mmol/L — ABNORMAL LOW (ref 22–32)
Calcium: 9.3 mg/dL (ref 8.9–10.3)
Chloride: 102 mmol/L (ref 98–111)
Creatinine, Ser: 0.65 mg/dL (ref 0.44–1.00)
GFR, Estimated: >60 mL/min (ref >60)
Glucose, Bld: 103 mg/dL — ABNORMAL HIGH (ref 70–99)
Potassium: 3 mmol/L — ABNORMAL LOW (ref 3.5–5.1)
Sodium: 130 mmol/L — ABNORMAL LOW (ref 135–145)
Total Bilirubin: 0.8 mg/dL (ref <1.2)
Total Protein: 7.3 g/dL (ref 6.5–8.1)

## 2023-09-17 MED ORDER — DIPHENHYDRAMINE HCL 25 MG PO CAPS
25.0000 mg | ORAL_CAPSULE | Freq: Two times a day (BID) | ORAL | Status: DC
  Administered 2023-09-17 – 2023-09-22 (×11): 25 mg via ORAL
  Filled 2023-09-17 (×10): qty 1

## 2023-09-17 MED ORDER — HYDROCORTISONE (PERIANAL) 2.5 % EX CREA
TOPICAL_CREAM | Freq: Two times a day (BID) | CUTANEOUS | Status: DC
  Filled 2023-09-17: qty 28.35

## 2023-09-17 MED ORDER — BISACODYL 10 MG RE SUPP
10.0000 mg | RECTAL | Status: DC
  Filled 2023-09-17: qty 1

## 2023-09-17 NOTE — Progress Notes (Signed)
Patient refused bowel program today requested to be done tomorrow.

## 2023-09-17 NOTE — Progress Notes (Signed)
Patient information reviewed and entered into [facility] by Becky Jep Dyas, PPS coordinator. Information including medical coding, function ability, and quality indicators will be reviewed and updated through discharge.   

## 2023-09-17 NOTE — Plan of Care (Signed)
  Problem: RH Balance Goal: LTG Patient will maintain dynamic standing with ADLs (OT) Description: LTG:  Patient will maintain dynamic standing balance with assist during activities of daily living (OT)  Flowsheets (Taken 09/17/2023 2112) LTG: Pt will maintain dynamic standing balance during ADLs with: Independent with assistive device   Problem: RH Bathing Goal: LTG Patient will bathe all body parts with assist levels (OT) Description: LTG: Patient will bathe all body parts with assist levels (OT) Flowsheets (Taken 09/17/2023 2112) LTG: Pt will perform bathing with assistance level/cueing: Independent with assistive device    Problem: RH Dressing Goal: LTG Patient will perform upper body dressing (OT) Description: LTG Patient will perform upper body dressing with assist, with/without cues (OT). Flowsheets (Taken 09/17/2023 2112) LTG: Pt will perform upper body dressing with assistance level of: Independent with assistive device Goal: LTG Patient will perform lower body dressing w/assist (OT) Description: LTG: Patient will perform lower body dressing with assist, with/without cues in positioning using equipment (OT) Flowsheets (Taken 09/17/2023 2112) LTG: Pt will perform lower body dressing with assistance level of: Independent with assistive device   Problem: RH Toileting Goal: LTG Patient will perform toileting task (3/3 steps) with assistance level (OT) Description: LTG: Patient will perform toileting task (3/3 steps) with assistance level (OT)  Flowsheets (Taken 09/17/2023 2112) LTG: Pt will perform toileting task (3/3 steps) with assistance level: Independent with assistive device   Problem: RH Laundry Goal: LTG Patient will perform laundry w/assist, cues (OT) Description: LTG: Patient will perform laundry with assistance, with/without cues (OT). Flowsheets (Taken 09/17/2023 2112) LTG: Pt will perform laundry with assistance level of: Independent with assistive device    Problem: RH Toilet Transfers Goal: LTG Patient will perform toilet transfers w/assist (OT) Description: LTG: Patient will perform toilet transfers with assist, with/without cues using equipment (OT) Flowsheets (Taken 09/17/2023 2112) LTG: Pt will perform toilet transfers with assistance level of: Independent with assistive device   Problem: RH Tub/Shower Transfers Goal: LTG Patient will perform tub/shower transfers w/assist (OT) Description: LTG: Patient will perform tub/shower transfers with assist, with/without cues using equipment (OT) Flowsheets (Taken 09/17/2023 2112) LTG: Pt will perform tub/shower stall transfers with assistance level of: Independent with assistive device

## 2023-09-17 NOTE — Discharge Instructions (Addendum)
Inpatient Rehab Discharge Instructions  Stacy Kelley Discharge date and time:  09/22/23  Activities/Precautions/ Functional Status: Activity: no lifting, driving, or strenuous exercise  till cleared by MD Diet: regular diet Wound Care: warm moist compresses to right arm pit area 3-4 times a day till area becomes flat.    Functional status:  ___ No restrictions     ___ Walk up steps independently ___ 24/7 supervision/assistance   ___ Walk up steps with assistance ___ Intermittent supervision/assistance  ___ Bathe/dress independently ___ Walk with walker     ___ Bathe/dress with assistance ___ Walk Independently    ___ Shower independently ___ Walk with assistance    ___ Shower with assistance _X__ No alcohol     ___ Return to work/school ________   Special Instructions:    COMMUNITY REFERRALS UPON DISCHARGE:     Outpatient: PT   &  OT             Agency:[facility]  [address] [place] [address] Phone:[phone]              Appointment Date/Time:WILL CALL YOU TO SET UP FOLLOW UP APPOINTMENTS  Medical Equipment/Items Ordered:ROLLATOR AND TUB SEAT                                                 Agency/Supplier:[facility]   [id number]  [name] HELP LINE: [phone] PROVIDES INFORMATION AND RESOURCES    My questions have been answered and I understand these instructions. I will adhere to these goals and the provided educational materials after my discharge from the hospital.  Patient/Caregiver Signature _______________________________ Date __________  Clinician Signature _______________________________________ Date __________  Please bring this form and your medication list with you to all your follow-up doctor's appointments.

## 2023-09-17 NOTE — Evaluation (Signed)
Physical Therapy Assessment and Plan  Patient Details  Name: Stacy Kelley MRN: [id number] Date of Birth: 06-Jan-1991  PT Diagnosis: Impaired sensation and Low back pain Rehab Potential: Good ELOS: 5-7 days   Today's Date: 09/17/2023 PT Individual Time: 4098-1191 PT Individual Time Calculation (min): 63 min    Hospital Problem: Principal Problem:   Transverse myelitis (HCC)   Past Medical History:  Past Medical History:  Diagnosis Date   Genital herpes    Gonorrhea    Sjogren's syndrome (HCC) 05/12/2022   SLE (systemic lupus erythematosus) (HCC)    Thyromegaly 09/03/2022   Vitamin D deficiency 06/11/2022   Past Surgical History:  Past Surgical History:  Procedure Laterality Date   CESAREAN SECTION      Assessment & Plan Clinical Impression: Patient is a 32 y.o. year old female with history of SLE--non compliant w/plaquenil, Sjogren's syndrome, genital herpes, GC, Vit D deficiency; who was admitted on 09/09/23 with 8 day history of BLE numbness and excruciating abdominal pain with weakness. 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 LP done revealing glucose 40 and protein 52 with elevated CSF IgG-11.3 and serum IgG-2050. Dr. Otelia Limes felt that work up c/w acute transverse myelitis and he was treated Solumedrol X 5 days. She has had issues with nausea and abdominal pain with concerns of GB disease and ultrasound was negative for GB wall thickening, stones or Murphy sign. PPI increased to BID and gabapentin recommended to help manage sensory deficits with ongoing weakness. MRI brain was negative for acute abnormality.    Dr. Amada Jupiter recommended vitamin D supplementation as well as plasmapheresis but patient reported resolution of pain but continues to have numbness with pain in side but has gotten better. She continues to have numbness with inability to feel urge to defecate or  urinate. She declined this due to  improvement in symptoms and wanted to wait and see if she could recover with therapy.  She developed drainage from right axilla and was started on Bactrim on 12/17. She feels that she is getting too much medications and has been refusing multiple meds. She developed petechial lesions bilateral feet overnight felt to be due to septra, she is concerned about her weight loss as well as inability to feel or push to have BM or to urinate.  Therapy has been working with patient who currently requires CGA to Min assist. She was independent PTA and CIR recommended due to functional decline. She experienced significant improvements with steroids.   Patient transferred to CIR on 09/16/2023 .   Patient currently requires  CGA  with mobility secondary to  proprioception deficits .  Prior to hospitalization, patient was independent  with mobility and lived with Son, Daughter, Other (Comment) (children's father provided intermittent assist) in a Apartment home.  Home access is flightStairs to enter.  Patient will benefit from skilled PT intervention to maximize safe functional mobility, minimize fall risk, and decrease caregiver burden for planned discharge home with intermittent assist.  Anticipate patient will benefit from follow up OP at discharge.  PT - End of Session Activity Tolerance: Tolerates 30+ min activity with multiple rests Endurance Deficit: Yes Endurance Deficit Description: decreased PT Assessment Rehab Potential (ACUTE/IP ONLY): Good PT Barriers to Discharge: Neurogenic Bowel & Bladder PT Barriers to Discharge Comments: decreased caregiver support PT Patient demonstrates impairments in the following area(s): Balance;Pain PT Transfers Functional Problem(s): Bed Mobility;Bed to Chair;Car PT Locomotion Functional Problem(s):  Ambulation;Stairs PT Plan PT Intensity: Minimum of 1-2 x/day ,45 to 90 minutes PT Frequency: 5 out of 7 days PT Duration Estimated Length  of Stay: 5-7 days PT Treatment/Interventions: Ambulation/gait training;Balance/vestibular training;Discharge planning;Community reintegration;Disease management/prevention;DME/adaptive equipment instruction;Functional mobility training;Neuromuscular re-education;Pain management;Patient/family education;Psychosocial support;Stair training;Therapeutic Activities;Therapeutic Exercise;UE/LE Strength taining/ROM;UE/LE Coordination activities;Wheelchair propulsion/positioning PT Transfers Anticipated Outcome(s): Mod I PT Locomotion Anticipated Outcome(s): Mod I PT Recommendation Recommendations for Other Services: None Follow Up Recommendations: Outpatient PT Patient destination: Home Equipment Recommended: Other (comment) Equipment Details: rollator   PT Evaluation Precautions/Restrictions Precautions Precautions: Fall Restrictions Weight Bearing Restrictions Per Provider Order: No General   Vital SignsTherapy Vitals Temp: 98.2 F (36.8 C) Temp Source: Oral Pulse Rate: 92 Resp: 17 BP: (!) 130/104 Patient Position (if appropriate): Sitting Oxygen Therapy SpO2: 100 % O2 Device: Room Air Pain   Pain Interference Pain Interference Pain Effect on Sleep: 8. Unable to answer Pain Interference with Therapy Activities: 8. Unable to answer Pain Interference with Day-to-Day Activities: 8. Unable to answer Home Living/Prior Functioning Home Living Available Help at Discharge: Family;Available PRN/intermittently Type of Home: Apartment Home Access: Stairs to enter Entrance Stairs-Number of Steps: flight Entrance Stairs-Rails: Right;Left Home Layout: One level Bathroom Shower/Tub: Engineer, manufacturing systems: Standard Bathroom Accessibility: Yes  Lives With: Audiological scientist;Other (Comment) (children's father provided intermittent assist) Prior Function Level of Independence: Independent with transfers;Independent with gait  Able to Take Stairs?: Yes Driving: Yes Vocation: Full time  employment Vision/Perception     Cognition Overall Cognitive Status: Within Functional Limits for tasks assessed Arousal/Alertness: Awake/alert Orientation Level: Oriented X4 Memory: Appears intact Awareness: Appears intact Problem Solving: Appears intact Safety/Judgment: Appears intact Sensation Sensation Light Touch: Impaired by gross assessment Proprioception: Impaired by gross assessment Additional Comments: thoracic and distal B LE numbness Coordination Gross Motor Movements are Fluid and Coordinated: Yes Fine Motor Movements are Fluid and Coordinated: Yes Finger Nose Finger Test: [facility] Heel Shin Test: slowed Motor  Motor Motor: Within Functional Limits Motor - Skilled Clinical Observations: WFL- abnormal sensation; proprioceptive deficits   Trunk/Postural Assessment  Cervical Assessment Cervical Assessment: Within Functional Limits Thoracic Assessment Thoracic Assessment: Within Functional Limits Lumbar Assessment Lumbar Assessment: Within Functional Limits Postural Control Postural Control: Deficits on evaluation Protective Responses: delayed  Balance Balance Balance Assessed: Yes Static Sitting Balance Static Sitting - Balance Support: No upper extremity supported Static Sitting - Level of Assistance: 6: Modified independent (Device/Increase time) Dynamic Sitting Balance Dynamic Sitting - Balance Support: No upper extremity supported Dynamic Sitting - Level of Assistance: 6: Modified independent (Device/Increase time) Static Standing Balance Static Standing - Balance Support: No upper extremity supported Static Standing - Level of Assistance: 5: Stand by assistance (CGA) Dynamic Standing Balance Dynamic Standing - Balance Support: No upper extremity supported;During functional activity Dynamic Standing - Level of Assistance: 5: Stand by assistance (CGA) Extremity Assessment      RLE Assessment RLE Assessment: Within Functional Limits LLE Assessment LLE  Assessment: Within Functional Limits  Care Tool Care Tool Bed Mobility Roll left and right activity   Roll left and right assist level: Supervision/Verbal cueing    Sit to lying activity   Sit to lying assist level: Supervision/Verbal cueing    Lying to sitting on side of bed activity   Lying to sitting on side of bed assist level: the ability to move from lying on the back to sitting on the side of the bed with no back support.: Supervision/Verbal cueing     Care Tool Transfers Sit to stand transfer   Sit  to stand assist level: Contact Guard/Touching assist    Chair/bed transfer   Chair/bed transfer assist level: Contact Guard/Touching Psychologist, counselling transfer assist level: Contact Guard/Touching assist      Care Tool Locomotion Ambulation   Assist level: Contact Guard/Touching assist Assistive device: No Device Max distance: >150 ft  Walk 10 feet activity   Assist level: Contact Guard/Touching assist Assistive device: No Device   Walk 50 feet with 2 turns activity   Assist level: Contact Guard/Touching assist Assistive device: No Device  Walk 150 feet activity   Assist level: Contact Guard/Touching assist Assistive device: No Device  Walk 10 feet on uneven surfaces activity   Assist level: Minimal Assistance - Patient > 75%    Stairs   Assist level: Contact Guard/Touching assist Stairs assistive device: 2 hand rails Max number of stairs: 12  Walk up/down 1 step activity   Walk up/down 1 step (curb) assist level: Contact Guard/Touching assist Walk up/down 1 step or curb assistive device: 2 hand rails  Walk up/down 4 steps activity   Walk up/down 4 steps assist level: Contact Guard/Touching assist Walk up/down 4 steps assistive device: 2 hand rails  Walk up/down 12 steps activity   Walk up/down 12 steps assist level: Contact Guard/Touching assist Walk up/down 12 steps assistive device: 2 hand rails  Pick up small objects from floor   Pick up small  object from the floor assist level: Contact Guard/Touching assist    Wheelchair Is the patient using a wheelchair?: No Type of Wheelchair: Manual   Wheelchair assist level: Dependent - Patient 0% Max wheelchair distance: 200 ft  Wheel 50 feet with 2 turns activity   Assist Level: Dependent - Patient 0%  Wheel 150 feet activity   Assist Level: Dependent - Patient 0%    Refer to Care Plan for Long Term Goals  SHORT TERM GOAL WEEK 1 PT Short Term Goal 1 (Week 1): STG's=LTG's due to ELOS  Recommendations for other services: None   Skilled Therapeutic Intervention Mobility Bed Mobility Bed Mobility: Rolling Right;Rolling Left;Supine to Sit;Sit to Supine Rolling Right: Supervision/verbal cueing Rolling Left: Supervision/Verbal cueing Supine to Sit: Supervision/Verbal cueing Transfers Transfers: Stand to Sit;Sit to Stand Sit to Stand: Contact Guard/Touching assist Stand to Sit: Contact Guard/Touching assist Transfer (Assistive device): None Locomotion  Gait Ambulation: Yes Gait Assistance: Contact Guard/Touching assist Gait Distance (Feet): 175 Feet Assistive device: None Gait Gait: Yes Gait Pattern: Impaired Gait Pattern: Narrow base of support Gait velocity: decreased Stairs / Additional Locomotion Stairs: Yes Stairs Assistance: Contact Guard/Touching assist Stair Management Technique: Two rails Number of Stairs: 12 Height of Stairs: 6 (inches) Wheelchair Mobility Wheelchair Mobility: No   PT Evaluation:   Pt oriented to [employer] and procedures and safety plan updated. Pt largely limited due to sensory and proprioceptive deficits and requires supervision for bed mobility, CGA with sit<>stand and gait >150 ft no AD. Pt navigated flight of stairs CGA to simulate home entry and will benefit form additional skilled therapy to address decreased activity tolerance and reactive balance. Pt without reports of pain in session, see above for additional details.    Discharge Criteria: Patient will be discharged from PT if patient refuses treatment 3 consecutive times without medical reason, if treatment goals not met, if there is a change in medical status, if patient makes no progress towards goals or if patient is discharged from hospital.  The above assessment, treatment plan, treatment alternatives and goals were discussed  and mutually agreed upon: by patient  Priscille Kluver PT, DPT  09/17/2023, 3:22 PM

## 2023-09-17 NOTE — Progress Notes (Signed)
[facility] Individual Statement of Services  Patient Name:  Stacy Kelley  Date:  09/17/2023  Welcome to the [facility].  Our goal is to provide you with an individualized program based on your diagnosis and situation, designed to meet your specific needs.  With this comprehensive rehabilitation program, you will be expected to participate in at least 3 hours of rehabilitation therapies Monday-Friday, with modified therapy programming on the weekends.  Your rehabilitation program will include the following services:  Physical Therapy (PT), Occupational Therapy (OT), 24 hour per day rehabilitation nursing, Therapeutic Recreaction (TR), Neuropsychology, Care Coordinator, Rehabilitation Medicine, Nutrition Services, and Pharmacy Services  Weekly team conferences will be held on Tuesday to discuss your progress.  Your Inpatient Rehabilitation Care Coordinator will talk with you frequently to get your input and to update you on team discussions.  Team conferences with you and your family in attendance may also be held.  Expected length of stay: 5-7 days  Overall anticipated outcome: mod/I level  Depending on your progress and recovery, your program may change. Your Inpatient Rehabilitation Care Coordinator will coordinate services and will keep you informed of any changes. Your Inpatient Rehabilitation Care Coordinator's name and contact numbers are listed  below.  The following services may also be recommended but are not provided by the [facility]:  Driving Evaluations Home Health Rehabiltiation Services Outpatient Rehabilitation Services Vocational Rehabilitation   Arrangements will be made to provide these services after discharge if needed.  Arrangements include referral to agencies that provide these services.  Your insurance has been verified to be:  [employer] Your primary doctor is:  Ralene Ok  Pertinent  information will be shared with your doctor and your insurance company.  Inpatient Rehabilitation Care Coordinator:  Dossie Der, [place] [phone] or Luna Glasgow  Information discussed with and copy given to patient by: Lucy Chris, 09/17/2023, 10:50 AM

## 2023-09-17 NOTE — Evaluation (Signed)
Occupational Therapy Assessment and Plan  Patient Details  Name: Stacy Kelley MRN: [id number] Date of Birth: 1991/07/12  OT Diagnosis: paraparesis at level around T 7 Rehab Potential: Rehab Potential (ACUTE ONLY): Good ELOS: ~7 days   Today's Date: 09/17/2023 OT Individual Time: 1040-1140 OT Individual Time Calculation (min): 60 min     Hospital Problem: Principal Problem:   Transverse myelitis (HCC)   Past Medical History:  Past Medical History:  Diagnosis Date   Genital herpes    Gonorrhea    Sjogren's syndrome (HCC) 05/12/2022   SLE (systemic lupus erythematosus) (HCC)    Thyromegaly 09/03/2022   Vitamin D deficiency 06/11/2022   Past Surgical History:  Past Surgical History:  Procedure Laterality Date   CESAREAN SECTION      Assessment & Plan Clinical Impression: Patient is a 32 y.o. year old female female with history of SLE--non compliant w/plaquenil, Sjogren's syndrome, genital herpes, GC, Vit D deficiency; who was admitted on 09/09/23 with 8 day history of BLE numbness and excruciating abdominal pain with weakness. 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 LP done revealing glucose 40 and protein 52 with elevated CSF IgG-11.3 and serum IgG-2050. Dr. Otelia Limes felt that work up c/w acute transverse myelitis and he was treated Solumedrol X 5 days. She has had issues with nausea and abdominal pain with concerns of GB disease and ultrasound was negative for GB wall thickening, stones or Murphy sign. PPI increased to BID and gabapentin recommended to help manage sensory deficits with ongoing weakness. MRI brain was negative for acute abnormality.    Dr. Amada Jupiter recommended vitamin D supplementation as well as plasmapheresis but patient reported resolution of pain but continues to have numbness with pain in side but has gotten better. She continues to have numbness with inability  to feel urge to defecate or urinate. She declined this due to  improvement in symptoms and wanted to wait and see if she could recover with therapy.  She developed drainage from right axilla and was started on Bactrim on 12/17. She feels that she is getting too much medications and has been refusing multiple meds. She developed petechial lesions bilateral feet overnight felt to be due to septra, she is concerned about her weight loss as well as inability to feel or push to have BM or to urinate.  Therapy has been working with patient who currently requires CGA to Min assist. She was independent PTA and CIR recommended due to functional decline. She experienced significant improvements with steroids. Patient transferred to CIR on 09/16/2023 .    Patient currently requires min with basic self-care skills and functional mobility   secondary to muscle weakness, decreased cardiorespiratoy endurance, decreased sensation, and decreased balance strategies.  Prior to hospitalization, patient could complete ADLs with independent .  Patient will benefit from skilled intervention to decrease level of assist with basic self-care skills and increase independence with basic self-care skills prior to discharge home with care partner.  Anticipate patient will require intermittent supervision and follow up outpatient.  OT - End of Session Activity Tolerance: Tolerates 10 - 20 min activity with multiple rests Endurance Deficit: Yes Endurance Deficit Description: decreased OT Assessment Rehab Potential (ACUTE ONLY): Good OT Patient demonstrates impairments in the following area(s): Balance;Pain;Perception;Endurance;Sensory OT Basic ADL's Functional Problem(s): Bathing;Dressing;Toileting OT Advanced ADL's Functional Problem(s): Laundry OT Transfers Functional Problem(s): Toilet;Tub/Shower OT Additional Impairment(s): None OT Plan OT Intensity: Minimum of 1-2  x/day, 45 to 90 minutes OT Frequency: 5 out of 7 days OT  Duration/Estimated Length of Stay: ~7 days OT Treatment/Interventions: Balance/vestibular training;Community reintegration;Disease mangement/prevention;Patient/family education;Neuromuscular re-education;Self Care/advanced ADL retraining;Therapeutic Exercise;UE/LE Coordination activities;Discharge planning;DME/adaptive equipment instruction;Functional mobility training;Psychosocial support;Therapeutic Activities;UE/LE Strength taining/ROM OT Self Feeding Anticipated Outcome(s): n/a OT Basic Self-Care Anticipated Outcome(s): mod I OT Toileting Anticipated Outcome(s): mod I OT Bathroom Transfers Anticipated Outcome(s): mod I OT Recommendation Recommendations for Other Services: Neuropsych consult Patient destination: Home Follow Up Recommendations: Outpatient OT Equipment Recommended: To be determined   OT Evaluation Precautions/Restrictions  Precautions Precautions: Fall Precaution Comments: numbness in bilateral LEs Restrictions Weight Bearing Restrictions Per Provider Order: No General Chart Reviewed: Yes Family/Caregiver Present: No Vital Signs Therapy Vitals Temp: 97.8 F (36.6 C) Temp Source: Oral Pulse Rate: 100 Resp: 17 BP: (!) 128/95 Patient Position (if appropriate): Sitting Oxygen Therapy SpO2: 100 % O2 Device: Room Air Pain   Home Living/Prior Functioning Home Living Living Arrangements: Spouse/significant other, Children Available Help at Discharge: Family, Available PRN/intermittently Type of Home: Apartment Home Access: Stairs to enter Secretary/administrator of Steps: flight Entrance Stairs-Rails: Right, Left Home Layout: One level Bathroom Shower/Tub: Associate Professor: Yes  Lives With: Son, Daughter, Other (Comment) Prior Function Level of Independence: Independent with transfers, Independent with gait  Able to Take Stairs?: Yes Driving: Yes Vocation: Full time employment Vision Baseline  Vision/History: 0 No visual deficits Ability to See in Adequate Light: 0 Adequate Patient Visual Report: No change from baseline Vision Assessment?: No apparent visual deficits Perception  Perception: Within Functional Limits Praxis Praxis: WFL Cognition Cognition Overall Cognitive Status: Within Functional Limits for tasks assessed Arousal/Alertness: Awake/alert Orientation Level: Person;Place;Situation Person: Oriented Place: Oriented Situation: Oriented Memory: Appears intact Attention: Selective Selective Attention: Appears intact Awareness: Appears intact Problem Solving: Appears intact Safety/Judgment: Appears intact Brief Interview for Mental Status (BIMS) Repetition of Three Words (First Attempt): 3 Temporal Orientation: Year: Correct Temporal Orientation: Month: Accurate within 5 days Temporal Orientation: Day: Correct Recall: "Sock": Yes, no cue required Recall: "Blue": Yes, no cue required Recall: "Bed": Yes, no cue required BIMS Summary Score: 15 Sensation Sensation Light Touch: Impaired Detail Light Touch Impaired Details: Impaired RLE;Impaired LLE Proprioception: Impaired Detail Proprioception Impaired Details: Impaired RLE;Impaired LLE Additional Comments: thoracic and distal B LE numbness Coordination Gross Motor Movements are Fluid and Coordinated: Yes Fine Motor Movements are Fluid and Coordinated: Yes Finger Nose Finger Test: [facility] Heel Shin Test: slowed Motor  Motor Motor: Within Functional Limits Motor - Skilled Clinical Observations: WFL- abnormal sensation; proprioceptive deficits  Trunk/Postural Assessment  Cervical Assessment Cervical Assessment: Within Functional Limits Thoracic Assessment Thoracic Assessment: Within Functional Limits Lumbar Assessment Lumbar Assessment: Within Functional Limits Postural Control Postural Control: Deficits on evaluation Protective Responses: delayed  Balance Balance Balance Assessed: Yes Static  Sitting Balance Static Sitting - Balance Support: No upper extremity supported Static Sitting - Level of Assistance: 6: Modified independent (Device/Increase time) Dynamic Sitting Balance Dynamic Sitting - Balance Support: No upper extremity supported Dynamic Sitting - Level of Assistance: 6: Modified independent (Device/Increase time) Static Standing Balance Static Standing - Balance Support: No upper extremity supported Static Standing - Level of Assistance: 5: Stand by assistance Dynamic Standing Balance Dynamic Standing - Balance Support: No upper extremity supported;During functional activity Dynamic Standing - Level of Assistance: 4: Min assist Extremity/Trunk Assessment RUE Assessment RUE Assessment: Within Functional Limits LUE Assessment LUE Assessment: Within Functional Limits  Care Tool Care Tool Self Care Eating   Eating Assist Level:  Independent    Oral Care    Oral Care Assist Level: Supervision/Verbal cueing (in standing)    Bathing   Body parts bathed by patient: Right arm;Left arm;Right lower leg;Abdomen;Front perineal area;Chest;Left upper leg;Left lower leg;Face;Buttocks;Right upper leg (per patient report)     Assist Level: Contact Guard/Touching assist    Upper Body Dressing(including orthotics)   What is the patient wearing?: Hospital gown only;Pull over shirt   Assist Level: Set up assist    Lower Body Dressing (excluding footwear)   What is the patient wearing?: Pants;Underwear/pull up (per patient report) Assist for lower body dressing: Contact Guard/Touching assist    Putting on/Taking off footwear   What is the patient wearing?: Non-skid slipper socks Assist for footwear: Minimal Assistance - Patient > 75%       Care Tool Toileting Toileting activity   Assist for toileting: Contact Guard/Touching assist     Care Tool Bed Mobility Roll left and right activity        Sit to lying activity        Lying to sitting on side of bed  activity         Care Tool Transfers Sit to stand transfer   Sit to stand assist level: Contact Guard/Touching assist    Chair/bed transfer   Chair/bed transfer assist level: Contact Guard/Touching assist     Toilet transfer   Assist Level: Contact Guard/Touching assist     Care Tool Cognition  Expression of Ideas and Wants Expression of Ideas and Wants: 4. Without difficulty (complex and basic) - expresses complex messages without difficulty and with speech that is clear and easy to understand  Understanding Verbal and Non-Verbal Content Understanding Verbal and Non-Verbal Content: 4. Understands (complex and basic) - clear comprehension without cues or repetitions   Memory/Recall Ability     Refer to Care Plan for Long Term Goals  SHORT TERM GOAL WEEK 1 OT Short Term Goal 1 (Week 1): STG=LTG mod I goals  Recommendations for other services: Neuropsych   Skilled Therapeutic Intervention Ot eval initiated with OT goals, purpose and role discussed with pt. Pt received sitting on the toilet. Pt ambulated to the sink with contact guard without a device to wash hands, brush teeth and was face. Pt able to demonstrate static to dynamic balance with supervision. Pt reports and demonstrates decr light touch sensation in bilateral feet (from below breast down). She reports she feels like she is walking "on clouds" and contributes to ambulating very slowly. Pt ambulated from her room to the ADL apartment at a very slow rate with contact guard fading to supervision. Pt did report taking Benadryl and is feeling more fatigued and tired. Taken via w/c to the dayroom. Transferred to the [facility] and performed with slight resistance in sitting and then progressed to standing. Only tolerated in standing about 30 seconds before needing to sit. Transitioned to the mat- practiced transitional movements ; going from standing to tall kneeling with decr UE support 2x and then performed "crawling into the bed"  as she would at home. Pt performed about 45 feet with contact guard back to room before fatigue. Transferred back into the recliner and left resting in with call bell in hand.   ADL ADL Grooming: Supervision/safety Where Assessed-Grooming: Standing at sink Toileting: Contact guard Where Assessed-Toileting: IT consultant Method: Ambulating ADL Comments: see care tool for patient report; pt didnt bathe or dress at time of eval Mobility  Bed Mobility Rolling  Right: Supervision/verbal cueing Rolling Left: Supervision/Verbal cueing Transfers Sit to Stand: Contact Guard/Touching assist Stand to Sit: Contact Guard/Touching assist   Discharge Criteria: Patient will be discharged from OT if patient refuses treatment 3 consecutive times without medical reason, if treatment goals not met, if there is a change in medical status, if patient makes no progress towards goals or if patient is discharged from hospital.  The above assessment, treatment plan, treatment alternatives and goals were discussed and mutually agreed upon: by patient  Adan Sis 09/17/2023, 8:49 PM

## 2023-09-17 NOTE — Progress Notes (Signed)
Pt declined bowel program. Stated she will need more clarification on the bowel program from the provider. Provided education and the need for the BP. Provider to be notified in AM  Stacy Severin      RN

## 2023-09-17 NOTE — Progress Notes (Signed)
PROGRESS NOTE   Subjective/Complaints:    Objective:   DG Abd 1 View Result Date: 09/16/2023 CLINICAL DATA:  Constipation EXAM: ABDOMEN - 1 VIEW COMPARISON:  None Available. FINDINGS: Nonobstructive bowel gas pattern. Normal colonic stool burden. Visualized osseous structures are within normal limits. IMPRESSION: Negative. Electronically Signed   By: Charline Bills M.D.   On: 09/16/2023 22:39   Recent Labs    09/15/23 0548 09/16/23 1221  WBC 7.0 7.2  HGB 12.2 13.4  HCT 37.5 40.3  PLT 365 351   Recent Labs    09/15/23 0548 09/16/23 1221  NA 134* 133*  K 3.1* 3.5  CL 102 99  CO2 24 20*  GLUCOSE 92 97  BUN 17 14  CREATININE 0.67 0.60  CALCIUM 9.5 10.0    Intake/Output Summary (Last 24 hours) at 09/17/2023 1104 Last data filed at 09/17/2023 0800 Gross per 24 hour  Intake 240 ml  Output --  Net 240 ml        Physical Exam: Vital Signs Blood pressure 120/88, pulse 99, temperature (!) 97.5 F (36.4 C), resp. rate 16, height 5\' 2"  (1.575 m), weight 43.2 kg, last menstrual period 11/14/2022, SpO2 100%.    General: awake, alert, appropriate, very concerned about taking meds- sitting up in bed; NAD HENT: conjugate gaze; oropharynx moist CV: regular to borderline tachycardic  rate- regular rhythm; no JVD Pulmonary: CTA B/L; no W/R/R- good air movement GI: soft, NT, ND, (+)BS Psychiatric: frustrated and stressed about discussing about B/B and DVT risk Neurological: Ox3 Skin- has ~ 1/2-1 inch in diameter infected hair follicle in R axilla- draining purulent drainage-  Neuro: Sensation to light touch decreased to absent from umbilicus to toes B/L   Assessment/Plan: 1. Functional deficits which require 3+ hours per day of interdisciplinary therapy in a comprehensive inpatient rehab setting. Physiatrist is providing close team supervision and 24 hour management of active medical problems listed  below. Physiatrist and rehab team continue to assess barriers to discharge/monitor patient progress toward functional and medical goals  Care Tool:  Bathing              Bathing assist       Upper Body Dressing/Undressing Upper body dressing        Upper body assist      Lower Body Dressing/Undressing Lower body dressing            Lower body assist       Toileting Toileting    Toileting assist       Transfers Chair/bed transfer  Transfers assist           Locomotion Ambulation   Ambulation assist              Walk 10 feet activity   Assist           Walk 50 feet activity   Assist           Walk 150 feet activity   Assist           Walk 10 feet on uneven surface  activity   Assist           Wheelchair  Assist               Wheelchair 50 feet with 2 turns activity    Assist            Wheelchair 150 feet activity     Assist          Blood pressure 120/88, pulse 99, temperature (!) 97.5 F (36.4 C), resp. rate 16, height 5\' 2"  (1.575 m), weight 43.2 kg, last menstrual period 11/14/2022, SpO2 100%.  Medical Problem List and Plan: 1. Functional deficits secondary to transverse myelitis             -discussed prognosis             -patient may shower             -ELOS/Goals: 7 days modI             Admit to CIR  First day of evaluations- con't CIR PT and OT 2.  Antithrombotics: -DVT/anticoagulation:  Pharmaceutical: Lovenox added today 12/19- pt felt Lovenox made her mood depressed- doesn't want anymore- we discussed her risk for DVT is very high- however she decided against discussing further today- will wait on Eliquis or changing Lovenox for today.              -antiplatelet therapy: N/A 3. Pain Management:  tylenol prn.  4. Mood/Behavior/Sleep:  LCSW to follow for evaluation and support.              -antipsychotic agents: N/A             --has been unable to sleep   (anxiety?).  --Patient aware that Melatonin added prn for sleep  as well as gabapentin prn for pain/neuropathy.  5. Neuropsych/cognition: This patient is capable of making decisions on her own behalf. 6. Skin/Wound Care: Routine pressure relief measures.  7. Fluids/Electrolytes/Nutrition: Monitor I/O. Check CMET in am   12/19- looks good- con't to monitor 8. SLE/Sjogren's: Continue Plaquenil as is willing to take it.              --discussed importance of compliance and also will work on simplifying medication regimen.    9. Vitamin D deficiency @ 10.32: Will change Vit D3 to Ergocalciferol due to severity of deficiency.    10. Recurrent right axilla hidradenitis: Had episode last month that was drained in ED but did not take atbx due to concerns of SE.  --Continue Bactrim dose #2 but developed petechiae last night. Willing to take it with benadryl premedication but would like it drained before it gets big again --Consult surgery for I & D?  12/19- con't Bactrim with benadryl, but explained would likely need ot change ABX if doesn't improve- Surgery called per d/w PA.  11. Neurogenic bowel: Will check KUB--start bowel program with suppository at nights   12/19- willing to do every other night- will change order 12. Neurogenic bladder: Discussed toileting every 4 hours and DV/Coude bladder             --Monitor voiding with PVR checks.    12/19- No caths required- bladder scans/PVR's zero so far! 13. 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  --Protonix and Carafate was added but has been refusing it so will d/c   14.. Hypokalemia: Has been repleted. Recheck in am--refused labs today   12/19- K+ this Am 3.5-  15.Marland Kitchen Hyponatremia: Will check follow up labs in am.   12/19- Na 133- overall stable- will monitor 16.Marland Kitchen  Hx B12 deficiency:470--> WNL on recheck.    I spent a total of  54  minutes on total care today- >50% coordination of care- due to  Spent 30 minutes in room  with pt going over Neurogenic bowel and bladder and how to treat- DVT risk and discussed Hidradenitis of R axilla  and ABX/treatment- also d/w PA and nursing as well-   LOS: 1 days A FACE TO FACE EVALUATION WAS PERFORMED  Nyaire Denbleyker 09/17/2023, 11:04 AM

## 2023-09-17 NOTE — Progress Notes (Signed)
Physical Therapy Session Note  Patient Details  Name: Stacy Kelley MRN: [id number] Date of Birth: 18-Feb-1991  Today's Date: 09/17/2023 PT Individual Time: 4098-1191 PT Individual Time Calculation (min): 72 min   Short Term Goals: Week 1:  PT Short Term Goal 1 (Week 1): STG's=LTG's due to ELOS  Skilled Therapeutic Interventions/Progress Updates: Pt presented in recliner agreeable to therapy. Pt denies pain at start of session. Session focused on general conditioning and strengthening. Pt completed ambulatory transfer to w/c with HHA and CGA. Pt transported to stairwell and pt was able to ascend/descend x 22 stairs with B rails and CGA. Pt ascended step through pattern and descended partially step through, with increased fatigue noted educated pt on step to pattern for energy conservation and pt completed stairs as such. Pt then transported to day room and participated in several rounds of corn hole on compliant and non-compliant surface for dynamic balance and endurance. Pt with overall good balance during activity but did require seated rest breaks between bouts. Pt also participated in ambulation weaving through cones without AD and CGA. Pt noted to display mild hip instability with increased fatigue. Pt educated on use of AD for energy conservation to which pt was amicable. PTA obtained rollator and educated on use locking/unlocking brakes. Pt then completed weaving though cones and throughout dayroom with rollator and supervision. Pt expressed would use AD for longer distances and in community. Pt then ambulated to NuStep and participated in NuStep L3 x 8 min for general conditioning. Pt initially used x 4 extremities but c/o increased soreness at IV site therefore deferred use of RUE for approx 6 min. Pt then completed transfer to w/c without AD and CGA and transported back to room. Pt completed ambulatory transfer to recliner and remained in recliner at end of session with call bell within reach  and needs met.      Therapy Documentation Precautions:  Restrictions Weight Bearing Restrictions Per Provider Order: No     Therapy/Group: Individual Therapy  Calandra Madura 09/17/2023, 2:43 PM

## 2023-09-17 NOTE — Progress Notes (Signed)
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 She has never used smokeless tobacco. She reports that she does not drink alcohol and does not use drugs.  Family / Support Systems Marital Status: Single Patient Roles: Partner, Parent, Other (Comment) (employee, sibling, granddaughter) Spouse/Significant Other: [phone] Children: 14 and 8 yo Other Supports: Mia-sister [phone]  Johnnie-grandmother [phone] Anticipated Caregiver: Winn Jock Ability/Limitations of Caregiver: Works so times she will be alone other family may come and check on her Caregiver Availability: Other (Comment) Winn Jock works so will have intermittent checks while he works from family members) Family Dynamics: Close with family but all work and she is not one to bother them. She hopes to be mod/i and able to stay alone her kids will be out of school during the christmas break  Social History Preferred language: English Religion: Non-Denominational Cultural Background: No issues Education: HS Health Literacy - How often do you need to have someone help you when you read instructions, pamphlets, or other written material from your doctor or pharmacy?: Never Writes: Yes Employment Status: Employed Name of Employer: [employer] Return to Work Plans: Unsure if will be able too depends upon her recovery and progress Investment banker, operational Issues: No issues Guardian/Conservator: None-according to MD pt is capable of making her own decisions while here   Abuse/Neglect Abuse/Neglect Assessment Can Be Completed: Yes Physical Abuse: Denies Verbal Abuse: Denies Sexual Abuse: Denies Exploitation of patient/patient's resources: Denies Self-Neglect: Denies  Patient response to: Social Isolation - How often do you feel lonely or isolated from those around you?: Never  Emotional Status Pt's affect, behavior and adjustment status: Pt is still trying to process all that has happened to her and her lupus diagnosis. She has always been independent and taken care of herself and her children. She is not one to ask for assist and hopes to not need it this time either Recent Psychosocial Issues: other health issues Psychiatric History: No history with all that is going on and her medical diagnosis she would benefit from seeing neruo-psych while here. Have placed on list for week of 12/30 Substance Abuse History: History of ETOH has stopped drinking and feels not an issue  Patient / Family Perceptions, Expectations & Goals Pt/Family understanding of illness & functional limitations: Pt can explain her health issues she is still trying to process all of it and feels asking questions of the MD helps. She hopes to do well here and is glad to be here on rehab. Will ask MD/PA to provide information to her. Premorbid pt/family roles/activities: mom, significant other, daughter, granddaughter, sibling, employee, etc Anticipated changes in roles/activities/participation: resume Pt/family expectations/goals: Pt states: " I hope to be able to take care of myself when I leave here."  [employer]: None Premorbid Home Care/DME Agencies: None Transportation available at discharge: self Is the patient able to respond to transportation needs?: Yes In the past 12 months, has  lack of transportation kept you from medical  appointments or from getting medications?: No In the past 12 months, has lack of transportation kept you from meetings, work, or from getting things needed for daily living?: No Resource referrals recommended: Neuropsychology  Discharge Planning Living Arrangements: Spouse/significant other, Children Support Systems: Children, Spouse/significant other, Parent, Other relatives, Friends/neighbors Type of Residence: Private residence Insurance Resources: [employer] (specify county) Architect: Employment, Garment/textile technologist Screen Referred: Yes Living Expenses: Psychologist, sport and exercise Management: Patient, Significant Other Does the patient have any problems obtaining your medications?: No Home Management: self Patient/Family Preliminary Plans: Return home with Sorgho and their children, Winn Jock does work and her family members can come by and check while he is working. She hopes to be mod/i by discharge and not need assist. Her children will be out on break from school the end of this week. Care Coordinator Anticipated Follow Up Needs: HH/OP  Clinical Impression Pleasant young female who is still trying to understand what has happened to her. She has good supports via [place] and her family members. All unfortunately work. She hopes to be mod/I by discharge. Have placed on the neuro-psych list to be seen. Await team's evaluations  Lucy Chris 09/17/2023, 10:48 AM

## 2023-09-17 NOTE — Progress Notes (Addendum)
Inpatient Rehabilitation Admission Medication Review by a Pharmacist  A complete drug regimen review was completed for this patient to identify any potential clinically significant medication issues.  High Risk Drug Classes Is patient taking? Indication by Medication  Antipsychotic Yes Prochlorperazine- nausea  Anticoagulant Yes Enoxaparin- VTE prophylaxis  Antibiotic Yes Septra(Bactrim)-recurrent right axilla hidradeniti   Opioid No   Antiplatelet No   Hypoglycemics/insulin No   Vasoactive Medication No   Chemotherapy No   Other No Acetaminophen- pain management Diphenhydramine-premed prior to Septra(bactrim) dosing. Ferrous gluconate- anemia Hydrocortisone rectal cream - hemorrhoids Hydroxychloroquine- SLE/ Sjogren's Vitamin D- supplement for vit D deficiency Melatonin-sleep Bisacodyl, senna-docusate- Constipation Guaifenesin DM-cough      Type of Medication Issue Identified Description of Issue Recommendation(s)  Drug Interaction(s) (clinically significant)     Duplicate Therapy     Allergy     No Medication Administration End Date     Incorrect Dose     Additional Drug Therapy Needed     Significant med changes from prior encounter (inform family/care partners about these prior to discharge). Protonix and Carafate was added  on [facility] acute care admission, but  patient has been refusing it so discontinued in CIR.  Restart PTA meds when and if necessary during CIR admission or at time of discharge, if warranted   Other       Clinically significant medication issues were identified that warrant physician communication and completion of prescribed/recommended actions by midnight of the next day:  No  Name of provider notified for urgent issues identified:   Provider Method of Notification:     Pharmacist comments:   Time spent performing this drug regimen review (minutes):  20   Stacy Kelley, RPh Clinical Pharmacist 09/17/2023 12:25 PM

## 2023-09-17 NOTE — Progress Notes (Addendum)
Diastolic bp was high on dynamap. Dois Davenport PA notified. Order to do manual bp. BP WNL after manual BP.

## 2023-09-18 DIAGNOSIS — G373 Acute transverse myelitis in demyelinating disease of central nervous system: Secondary | ICD-10-CM | POA: Diagnosis not present

## 2023-09-18 MED ORDER — ENOXAPARIN SODIUM 30 MG/0.3ML IJ SOSY
30.0000 mg | PREFILLED_SYRINGE | INTRAMUSCULAR | Status: DC
  Filled 2023-09-18: qty 0.3

## 2023-09-18 MED ORDER — POTASSIUM CHLORIDE CRYS ER 20 MEQ PO TBCR
40.0000 meq | EXTENDED_RELEASE_TABLET | Freq: Two times a day (BID) | ORAL | Status: AC
  Administered 2023-09-18 (×2): 40 meq via ORAL
  Filled 2023-09-18 (×2): qty 2

## 2023-09-18 NOTE — Progress Notes (Signed)
Physical Therapy Session Note  Patient Details  Name: Stacy Kelley MRN: [id number] Date of Birth: 09/05/1991  Today's Date: 09/18/2023 PT Individual Time: 1005-1100 PT Individual Time Calculation (min): 55 min   Short Term Goals: Week 1:  PT Short Term Goal 1 (Week 1): STG's=LTG's due to ELOS  Skilled Therapeutic Interventions/Progress Updates:    Pt presents in room in bed, agreeable to PT. Pt denies pain at this time, continues to endorse BLE numbness and cold sensation in bilateral feet. Session focused on therapeutic activities for participation with self care tasks, therapeutic use of self, gait training, and NMR for single limb stability and BLE coordination.  Pt completes bed mobility with supervision. Pt ambulates without device short distance to bathroom and completes 3/3 toileting tasks with distant supervision. Pt continent of urine, charted. Pt ambulates without device to sink to complete hand hygiene with supervision without device then ambulates to bed to don scrub top with supervision. Pt then ambulates with rollator from room to main gym with supervision, slow gait speed but good stability, minimal to no postural sway noted. Pt comes to sitting on EOM.  Pt completes gait training with agility ladder to promote single limb stability, increased step length/height and BLE coordination with multidirectional stepping stability including: - forward walking, one foot in each square x6 trials, CGA/min assist with several LOB with longer step length. Pt becomes emotional during this requiring rest break and therapist provides therapeutic use of self - side stepping bilaterally x4 trials - backwards stepping x2 trials  Pt completes NMR for single limb stability and increased stance time as well as BLE coordination including: - step taps x20 alternating BLE 8" step - cone taps forward/lateral/crossover 2 trials BLE  Pt ambulates back to room with rollator with supervision and returns  to sitting in recliner. Pt remains seated with RN present in room at end of session.  Therapy Documentation Precautions:  Precautions Precautions: Fall Precaution Comments: numbness in bilateral LEs Restrictions Weight Bearing Restrictions Per Provider Order: No   Therapy/Group: Individual Therapy  Edwin Cap PT, DPT 09/18/2023, 3:47 PM

## 2023-09-18 NOTE — Progress Notes (Signed)
Pt refused Subcute Lovenox. Education provided. Charge aware. Provider to be notified in AM.

## 2023-09-18 NOTE — Progress Notes (Signed)
Physical Therapy Session Note  Patient Details  Name: Stacy Kelley MRN: [id number] Date of Birth: 13-Jan-1991  Today's Date: 09/18/2023 PT Individual Time: 4010-2725DGU 1304-1330  PT Individual Time Calculation (min): 42 min and 26 min  Short Term Goals: Week 1:  PT Short Term Goal 1 (Week 1): STG's=LTG's due to ELOS  Skilled Therapeutic Interventions/Progress Updates: Tx1: Pt presented in bed agreeable to therapy. Pt denies pain but states BLE feel "cold", discussed changes in sensation and return of sensation may be occurring. Pt completed bed mobility mod I with use of bed features. Pt ambulated to sink without AD and completed hand hygiene in standing mod I. Pt then received rollator and ambulated ~246ft to main gym. In gym participated in gait/balance activities including stepping over small hurdles both forward and side stepping. Pt also participated in Sit to stand with use of Tidal tank 2 x 10. Performed step ups from Airex to 6in step 2 x 5 bilaterally. Pt initially requiring HHA but was able to progress to CGA. Pt ambulated back to room in same manner as prior without requiring any rest breaks. Pt transferred back to bed at end of session and left sitting EOB with call bell within reach and needs met.   Tx2: Pt presented in recliner agreeable to therapy. Pt denies pain and states anticipates lunch arriving soon (pt forgot to order earlier). Session focused on ambulation without AD for increased balance challenge and endurance. Pt stood with close supervision and ambulated to hallway. Pt initially reaching for wall rail and noted to ambulate with shortened steps and decreased trunk rotation as well as decreased arm swing. Pt initially ambulating 145ft down hallway. Pt encouraged and when back at room agreeable to try increased distance. Pt then ambulated 552ft with CGA. Pt with x 1 small LOB requiring CGA for recovery. Pt cued to increase arm swing as well as increased step length. Upon  nearing return to room pt encouraged to increase pace which she was able to perform but noted increased lateral sway. Once back in room pt requesting to use bathroom. Pt ambulated to bathroom with CGA and completed toilet transfers with supervision (continent urinary void). Pt returned to recliner in same manner at end of session and left with belt alarm on, call bell within reach and needs met.      Therapy Documentation Precautions:  Precautions Precautions: Fall Precaution Comments: numbness in bilateral LEs Restrictions Weight Bearing Restrictions Per Provider Order: No General:   Vital Signs: Therapy Vitals Temp: 98 F (36.7 C) Temp Source: Oral Pulse Rate: (!) 102 Resp: 17 Oxygen Therapy SpO2: 100 %  Therapy/Group: Individual Therapy  Ardian Haberland 09/18/2023, 3:04 PM

## 2023-09-18 NOTE — Progress Notes (Signed)
Patient refused bowel program claims she had BM this morning.

## 2023-09-18 NOTE — IPOC Note (Signed)
Overall Plan of Care [facility]) Patient Details Name: Stacy Kelley MRN: [id number] DOB: November 29, 1990  Admitting Diagnosis: Transverse myelitis [facility])  Hospital Problems: Principal Problem:   Transverse myelitis (HCC)     Functional Problem List: Nursing Endurance, Medication Management, Sensory, Safety, Nutrition  PT Balance, Pain  OT Balance, Pain, Perception, Endurance, Sensory  SLP    TR         Basic ADL's: OT Bathing, Dressing, Toileting     Advanced  ADL's: OT Laundry     Transfers: PT Bed Mobility, Bed to Chair, Customer service manager, Tub/Shower     Locomotion: PT Ambulation, Stairs     Additional Impairments: OT None  SLP        TR      Anticipated Outcomes Item Anticipated Outcome  Self Feeding n/a  Swallowing      Basic self-care  mod I  Toileting  mod I   Bathroom Transfers mod I  Bowel/Bladder  Maintain continence  Transfers  Mod I  Locomotion  Mod I  Communication     Cognition     Pain  <3  Safety/Judgment  Mod I and no falls   Therapy Plan: PT Intensity: Minimum of 1-2 x/day ,45 to 90 minutes PT Frequency: 5 out of 7 days PT Duration Estimated Length of Stay: 5-7 days OT Intensity: Minimum of 1-2 x/day, 45 to 90 minutes OT Frequency: 5 out of 7 days OT Duration/Estimated Length of Stay: ~7 days     Team Interventions: Nursing Interventions Patient/Family Education, Disease Management/Prevention, Medication Management, Discharge Planning, Psychosocial Support  PT interventions Ambulation/gait training, Warden/ranger, Discharge planning, Community reintegration, Disease management/prevention, DME/adaptive equipment instruction, Functional mobility training, Neuromuscular re-education, Pain management, Patient/family education, Psychosocial support, Stair training, Therapeutic Activities, Therapeutic Exercise, UE/LE Strength taining/ROM, UE/LE Coordination activities, Wheelchair propulsion/positioning  OT Interventions  Warden/ranger, Community reintegration, Disease mangement/prevention, Equities trader education, Neuromuscular re-education, Self Care/advanced ADL retraining, Therapeutic Exercise, UE/LE Coordination activities, Discharge planning, DME/adaptive equipment instruction, Functional mobility training, Psychosocial support, Therapeutic Activities, UE/LE Strength taining/ROM  SLP Interventions    TR Interventions    SW/CM Interventions Discharge Planning, Psychosocial Support, Patient/Family Education   Barriers to Discharge MD  Medical stability, Home enviroment access/loayout, Wound care, Lack of/limited family support, Weight, Medication compliance, and Behavior  Nursing Home environment access/layout, Medication compliance 1 level apt., 1 flight of steps, 2 rails, SO to provide care 24/7  PT Neurogenic Bowel & Bladder decreased caregiver support  OT      SLP      SW       Team Discharge Planning: Destination: PT-Home ,OT- Home , SLP-  Projected Follow-up: PT-Outpatient PT, OT-  Outpatient OT, SLP-  Projected Equipment Needs: PT-Other (comment), OT- To be determined, SLP-  Equipment Details: PT-rollator, OT-  Patient/family involved in discharge planning: PT- Patient,  OT-Patient, SLP-   MD ELOS: ~ 7 days Medical Rehab Prognosis:  Good Assessment: The patient has been admitted for CIR therapies with the diagnosis of transverse myelitis. The team will be addressing functional mobility, strength, stamina, balance, safety, adaptive techniques and equipment, self-care, bowel and bladder mgt, patient and caregiver education, . Goals have been set at mod I. Anticipated discharge destination is home.        See Team Conference Notes for weekly updates to the plan of care

## 2023-09-18 NOTE — Consult Note (Signed)
Consult Note  Stacy Kelley 1991-04-22  [id number].    Requesting MD: Genice Rouge, MD Chief Complaint/Reason for Consult: Hidradenitis  HPI:  Patient is a 32 year old female currently admitted to CIR with transverse myelitis with hx of hidradenitis suppurativa of right axilla. She had undergone I&D in the ED about 1 month ago but reportedly she did not take any antibiotics. She is afebrile. PMH otherwise significant for SLE/Sjogren's syndrome and neurogenic bowel and bladder. Patient reports that she noted some irritation again around 3 weeks ago and it has progressively worsened. She reports that last night she was able to get the lesion to spontaneously drain and has gotten a lot of purulent fluid out.   ROS: Negative other than HPI  Family History  Problem Relation Age of Onset   Diabetes Maternal Grandmother    Diabetes Maternal Grandfather    Diabetes Paternal Grandmother    Hypertension Paternal Grandfather    Diabetes Paternal Grandfather     Past Medical History:  Diagnosis Date   Genital herpes    Gonorrhea    Sjogren's syndrome (HCC) 05/12/2022   SLE (systemic lupus erythematosus) (HCC)    Thyromegaly 09/03/2022   Vitamin D deficiency 06/11/2022    Past Surgical History:  Procedure Laterality Date   CESAREAN SECTION      Social History:  reports that she has never smoked. She has never used smokeless tobacco. She reports that she does not drink alcohol and does not use drugs.  Allergies:  Allergies  Allergen Reactions   Prednisone Other (See Comments)    Feet became swollen and blotchy rashes appeared on the feet shortly after starting this   Augmentin [Amoxicillin-Pot Clavulanate] Swelling and Rash   Doxycycline Swelling, Rash and Other (See Comments)    Feet became swollen and blotchy rashes appeared on the feet shortly after starting this   Penicillins Swelling, Rash and Other (See Comments)    Feet became swollen and blotchy rashes appeared  on the feet shortly after starting this    Medications Prior to Admission  Medication Sig Dispense Refill   diphenhydrAMINE (BENADRYL) 25 mg capsule Take 1 capsule (25 mg total) by mouth every 6 (six) hours as needed for itching or allergies.     ferrous gluconate (FERGON) 324 MG tablet Take 324 mg by mouth daily as needed (for low iron).     hydroxychloroquine (PLAQUENIL) 200 MG tablet Take 200 mg by mouth daily.     ibuprofen (ADVIL) 800 MG tablet Take 800 mg by mouth every 8 (eight) hours as needed for mild pain (pain score 1-3) or headache.     pantoprazole (PROTONIX) 40 MG tablet Take 1 tablet (40 mg total) by mouth daily.     potassium chloride SA (KLOR-CON M) 20 MEQ tablet Take 2 tablets (40 mEq total) by mouth daily for 3 days. (Patient not taking: Reported on 09/09/2023) 6 tablet 0   sucralfate (CARAFATE) 1 GM/10ML suspension Take 10 mLs (1 g total) by mouth 4 (four) times daily -  with meals and at bedtime.     sulfamethoxazole-trimethoprim (BACTRIM DS) 800-160 MG tablet Take 1 tablet by mouth every 12 (twelve) hours for 3 days.      Blood pressure 120/88, pulse 99, temperature 98.9 F (37.2 C), resp. rate 16, height 5\' 2"  (1.575 m), weight 43.2 kg, last menstrual period 11/14/2022, SpO2 100%. Physical Exam:  General: pleasant, WD, thin female who is laying in bed in NAD HEENT: head  is normocephalic, atraumatic.  Sclera are noninjected.  Pupils equal and round, EOMI.  Ears and nose without any masses or lesions.  Mouth is pink and moist Heart: regular, rate, and rhythm. Lungs: Respiratory effort nonlabored Abd: non-distended MS: moving BUEs without difficulty  Skin: R axilla as pictured below with drainage of purulent material and no surrounding cellulitis  Neuro: Speech clear, follows commands Psych: appropriate affect.   Results for orders placed or performed during the hospital encounter of 09/16/23 (from the past 48 hours)  CBC with Differential/Platelet     Status: None    Collection Time: 09/17/23  5:19 PM  Result Value Ref Range   WBC 5.4 4.0 - 10.5 K/uL   RBC 4.74 3.87 - 5.11 MIL/uL   Hemoglobin 13.1 12.0 - 15.0 g/dL   HCT 62.1 30.8 - 65.7 %   MCV 83.5 80.0 - 100.0 fL   MCH 27.6 26.0 - 34.0 pg   MCHC 33.1 30.0 - 36.0 g/dL   RDW 84.6 96.2 - 95.2 %   Platelets 319 150 - 400 K/uL   nRBC 0.0 0.0 - 0.2 %   Neutrophils Relative % 66 %   Neutro Abs 3.6 1.7 - 7.7 K/uL   Lymphocytes Relative 26 %   Lymphs Abs 1.4 0.7 - 4.0 K/uL   Monocytes Relative 6 %   Monocytes Absolute 0.3 0.1 - 1.0 K/uL   Eosinophils Relative 1 %   Eosinophils Absolute 0.0 0.0 - 0.5 K/uL   Basophils Relative 0 %   Basophils Absolute 0.0 0.0 - 0.1 K/uL   Immature Granulocytes 1 %   Abs Immature Granulocytes 0.04 0.00 - 0.07 K/uL    Comment: Performed at [facility], [address], [place], [place] [address]  Comprehensive metabolic panel     Status: Abnormal   Collection Time: 09/17/23  5:19 PM  Result Value Ref Range   Sodium 130 (L) 135 - 145 mmol/L   Potassium 3.0 (L) 3.5 - 5.1 mmol/L   Chloride 102 98 - 111 mmol/L   CO2 21 (L) 22 - 32 mmol/L   Glucose, Bld 103 (H) 70 - 99 mg/dL    Comment: Glucose reference range applies only to samples taken after fasting for at least 8 hours.   BUN 9 6 - 20 mg/dL   Creatinine, Ser 4.40 0.44 - 1.00 mg/dL   Calcium 9.3 8.9 - 10.2 mg/dL   Total Protein 7.3 6.5 - 8.1 g/dL   Albumin 3.6 3.5 - 5.0 g/dL   AST 13 (L) 15 - 41 U/L   ALT 19 0 - 44 U/L   Alkaline Phosphatase 50 38 - 126 U/L   Total Bilirubin 0.8 <1.2 mg/dL   GFR, Estimated >72 >53 mL/min    Comment: (NOTE) Calculated using the CKD-EPI Creatinine Equation (2021)    Anion gap 7 5 - 15    Comment: Performed at [facility], [address] [place], [place] [address]   DG Abd 1 View Result Date: 09/16/2023 CLINICAL DATA:  Constipation EXAM: ABDOMEN - 1 VIEW COMPARISON:  None Available. FINDINGS: Nonobstructive bowel gas pattern. Normal colonic stool burden.  Visualized osseous structures are within normal limits. IMPRESSION: Negative. Electronically Signed   By: Charline Bills M.D.   On: 09/16/2023 22:39      Assessment/Plan Hidradenitis suppurativa of R axilla  - R axillary abscess appears to be adequately draining this AM - would not recommend I&D at this time, warm compresses to encourage continued drainage -  continue PO abx another 5-7 days  - could ask pharmacy if topical clindamycin available - if not could consider on DC - outpatient follow up with dermatology  - general surgery will sign off  I reviewed last 24 h vitals and pain scores, last 48 h intake and output, last 24 h labs and trends, and Rehab attending notes .  Juliet Rude, [facility] 09/18/2023, 11:48 AM Please see Amion for pager number during day hours 7:00am-4:30pm

## 2023-09-18 NOTE — Progress Notes (Signed)
PROGRESS NOTE   Subjective/Complaints:  Pt reports has massaged R axilla infected hair follicle- has gotten a lot of pus out of it- but still draining.  Asking when can stop Bactrim/how long will be on it- we discussed trying Clindamycin because I think it might be more effective- pt doesn't want to change ABX.  Pt also refusing Lovenox per nursing- she still feels it causes side effects by making her "feel depressed".  Refused bowel program- had small BM yesterday evening- said might do today.    ROS:  Pt denies SOB, abd pain, CP, N/V/C/D, and vision changes  Negative except for HPI  Objective:   DG Abd 1 View Result Date: 09/16/2023 CLINICAL DATA:  Constipation EXAM: ABDOMEN - 1 VIEW COMPARISON:  None Available. FINDINGS: Nonobstructive bowel gas pattern. Normal colonic stool burden. Visualized osseous structures are within normal limits. IMPRESSION: Negative. Electronically Signed   By: Charline Bills M.D.   On: 09/16/2023 22:39   Recent Labs    09/16/23 1221 09/17/23 1719  WBC 7.2 5.4  HGB 13.4 13.1  HCT 40.3 39.6  PLT 351 319   Recent Labs    09/16/23 1221 09/17/23 1719  NA 133* 130*  K 3.5 3.0*  CL 99 102  CO2 20* 21*  GLUCOSE 97 103*  BUN 14 9  CREATININE 0.60 0.65  CALCIUM 10.0 9.3    Intake/Output Summary (Last 24 hours) at 09/18/2023 0758 Last data filed at 09/17/2023 1900 Gross per 24 hour  Intake 720 ml  Output --  Net 720 ml        Physical Exam: Vital Signs Blood pressure 120/88, pulse 99, temperature 98.9 F (37.2 C), resp. rate 16, height 5\' 2"  (1.575 m), weight 43.2 kg, last menstrual period 11/14/2022, SpO2 100%.     General: awake, alert, sitting up in bed; was doing HEP; NAD HENT: conjugate gaze; oropharynx moist CV: regular rhythm- borderline tachycardic rate; no JVD Pulmonary: CTA B/L; no W/R/R- good air movement GI: soft, NT, ND, (+)BS- hypoactive Psychiatric:  refusing multiple options for treatment- frustrated with plan Neurological: Ox3  Skin- has ~ 1/2-1 inch in diameter infected hair follicle in R axilla- draining purulent drainage-  still, however looks less swollen (pt massaging it) Neuro: Sensation to light touch decreased to absent from umbilicus to toes B/L   Assessment/Plan: 1. Functional deficits which require 3+ hours per day of interdisciplinary therapy in a comprehensive inpatient rehab setting. Physiatrist is providing close team supervision and 24 hour management of active medical problems listed below. Physiatrist and rehab team continue to assess barriers to discharge/monitor patient progress toward functional and medical goals  Care Tool:  Bathing    Body parts bathed by patient: Right arm, Left arm, Right lower leg, Abdomen, Front perineal area, Chest, Left upper leg, Left lower leg, Face, Buttocks, Right upper leg (per patient report)         Bathing assist Assist Level: Contact Guard/Touching assist     Upper Body Dressing/Undressing Upper body dressing   What is the patient wearing?: Hospital gown only, Pull over shirt    Upper body assist Assist Level: Set up assist    Lower Body Dressing/Undressing  Lower body dressing      What is the patient wearing?: Pants, Underwear/pull up (per patient report)     Lower body assist Assist for lower body dressing: Contact Guard/Touching assist     Toileting Toileting    Toileting assist Assist for toileting: Contact Guard/Touching assist     Transfers Chair/bed transfer  Transfers assist     Chair/bed transfer assist level: Contact Guard/Touching assist     Locomotion Ambulation   Ambulation assist      Assist level: Contact Guard/Touching assist Assistive device: No Device Max distance: >150 ft   Walk 10 feet activity   Assist     Assist level: Contact Guard/Touching assist Assistive device: No Device   Walk 50 feet  activity   Assist    Assist level: Contact Guard/Touching assist Assistive device: No Device    Walk 150 feet activity   Assist    Assist level: Contact Guard/Touching assist Assistive device: No Device    Walk 10 feet on uneven surface  activity   Assist     Assist level: Minimal Assistance - Patient > 75%     Wheelchair     Assist Is the patient using a wheelchair?: No Type of Wheelchair: Manual    Wheelchair assist level: Dependent - Patient 0% Max wheelchair distance: 200 ft    Wheelchair 50 feet with 2 turns activity    Assist        Assist Level: Dependent - Patient 0%   Wheelchair 150 feet activity     Assist      Assist Level: Dependent - Patient 0%   Blood pressure 120/88, pulse 99, temperature 98.9 F (37.2 C), resp. rate 16, height 5\' 2"  (1.575 m), weight 43.2 kg, last menstrual period 11/14/2022, SpO2 100%.  Medical Problem List and Plan: 1. 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  Antithrombotics: -DVT/anticoagulation:  Pharmaceutical: Lovenox added today 12/19- pt felt Lovenox made her mood depressed- doesn't want anymore- we discussed her risk for DVT is very high- however she decided against discussing further today- will wait on Eliquis or changing Lovenox for today.   12/20- pt doesn't want to discuss today- refusing lovenox             -antiplatelet therapy: N/A 3. Pain Management:  tylenol prn.  4. Mood/Behavior/Sleep:  LCSW to follow for evaluation and support.              -antipsychotic agents: N/A             --has been unable to sleep  (anxiety?).  --Patient aware that Melatonin added prn for sleep  as well as gabapentin prn for pain/neuropathy.  5. Neuropsych/cognition: This patient is capable of making decisions on her own  behalf. 6. Skin/Wound Care: Routine pressure relief measures.  7. Fluids/Electrolytes/Nutrition: Monitor I/O. Check CMET in am   12/19- looks good- con't to monitor 8. SLE/Sjogren's: Continue Plaquenil as is willing to take it.              --discussed importance of compliance and also will work on  simplifying medication regimen.    9. Vitamin D deficiency @ 10.32: Will change Vit D3 to Ergocalciferol due to severity of deficiency.    10. Recurrent right axilla hidradenitis: Had episode last month that was drained in ED but did not take atbx due to concerns of SE.  --Continue Bactrim dose #2 but developed petechiae last night. Willing to take it with benadryl premedication but would like it drained before it gets big again --Consult surgery for I & D?  12/19- con't Bactrim with benadryl, but explained would likely need ot change ABX if doesn't improve- Surgery called per d/w PA.  12/20- I think pt would benefit from Clindamycin, however she doesn't want to change ABX- I explained will not stop Bactrim until I&D/no more drainage.  Don't have end date on Bactrim due to this. Hopefully surgery comes today to I&D 11. Neurogenic bowel: Will check KUB--start bowel program with suppository at nights   12/19- willing to do every other night- will change order  12/20- pt refused bowel program- had small BM last night- needs a larger BM, but  cannot make pt do bowel program, take PO bowel meds.  12. Neurogenic bladder: Discussed toileting every 4 hours and DV/Coude bladder             --Monitor voiding with PVR checks.    12/19- No caths required- bladder scans/PVR's zero so far! 13. 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  --Protonix and Carafate was added but has been refusing it so will d/c   14.. Hypokalemia: Has been repleted. Recheck in am--refused labs today   12/19- K+ this Am 3.5-   12/20- K_ 3.0- will replete 40 mEq x2 and recheck Monday- if still down, might need to put  on scheduled K+.  15.. Hyponatremia: Will check follow up labs in am.   12/19- Na 133- overall stable- will monitor  12/20- Na down to 130- if drops further, will need to intervene.  16..  Hx B12 deficiency:470--> WNL on recheck.    I spent a total of 50   minutes on total care today- >50% coordination of care- due to  D/w nursing about pt's refusal of Lovenox and bowel program- also about overall unhappy about requiring any meds.  Also discussion with PA and nursing again about low K+ and I&D needed by surgery.  Pt also seen twice as well as IPOC done.    LOS: 2 days A FACE TO FACE EVALUATION WAS PERFORMED  Stacy Kelley 09/18/2023, 7:58 AM

## 2023-09-18 NOTE — Progress Notes (Signed)
Patient ID: Stacy Kelley, female   DOB: 1991/04/10, 32 y.o.   MRN: [id number]  Team and MD feel pt will be ready for discharge 12/24 with goals of mod/I level. Pt thinks she will be ready but has concerns and wants to be sure she is ready to go home. Have ordered rollator and tub seat via [employer]. Discussed OP therapies due to can not find a home health agency to take her managed medicaid. She [place] close to [address] [facility]. Have faxed referral to them and they will reach out to set up OP appointments with pt. Pt plans on applying for disability and has started application online. Work toward discharge 12/24.

## 2023-09-18 NOTE — Progress Notes (Signed)
Occupational Therapy Session Note  Patient Details  Name: Stacy Kelley MRN: [id number] Date of Birth: November 29, 1990  Today's Date: 09/18/2023 OT Individual Time: 1103-1200 OT Individual Time Calculation (min): 57 min    Short Term Goals: Week 1:  OT Short Term Goal 1 (Week 1): STG=LTG mod I goals  Skilled Therapeutic Interventions/Progress Updates:     Pt received sitting up in recliner presenting to be in good spirits receptive to skilled OT session reporting 0/10 pain- OT offering intermittent rest breaks, repositioning, and therapeutic support to optimize participation in therapy session. Pt requesting to take shower this AM- focus this session BADL retraining with focus on balance, safety, activity tolerance, and functional mobility training. Pt completed functional mobility to BR no AD with CGA and transferred to toilet using grab bar with close supervision. Provided increased time on toilet d/t need for BM- continent B&B documented in flowsheets. Pt able to complete 3/3 toileting tasks with close supervision using grab bar for balance. Pt completed functional mobility within her room using rollator and gathered clothing with close supervision no LOB noted. Pt stood to doff clothing while holding onto wall for balance with close supervision- education provided on fall prevention and sitting to doff clothing to increase safety, however Pt politely replying "I feel safe doing it this way". Pt transferred to walk-in shower using grab bars close supervision. Once seated in shower, Pt able to complete U/LB bathing with supervision no LOB noted. Pt completed functional mobility back to room using rollator close supervision. Pt applied lotion and completed dressing tasks seated EOB with supervision. Stand pivot EOB > recliner using rollator supervision. Pt was left resting in recliner with call bell in reach, seatbelt alarm on, and all needs met.    Therapy Documentation Precautions:   Precautions Precautions: Fall Precaution Comments: numbness in bilateral LEs Restrictions Weight Bearing Restrictions Per Provider Order: No   Therapy/Group: Individual Therapy  Clide Deutscher 09/18/2023, 11:15 AM

## 2023-09-19 DIAGNOSIS — R209 Unspecified disturbances of skin sensation: Secondary | ICD-10-CM

## 2023-09-19 DIAGNOSIS — L732 Hidradenitis suppurativa: Secondary | ICD-10-CM

## 2023-09-19 MED ORDER — MUSCLE RUB 10-15 % EX CREA
TOPICAL_CREAM | CUTANEOUS | Status: DC | PRN
  Filled 2023-09-19: qty 85

## 2023-09-19 NOTE — Progress Notes (Signed)
Occupational Therapy Session Note  Patient Details  Name: Stacy Kelley MRN: [id number] Date of Birth: [birth year]  Today's Date: 09/19/2023 OT Individual Time: 1015-1059 OT Individual Time Calculation (min): 44 min    Short Term Goals: Week 1:  OT Short Term Goal 1 (Week 1): STG=LTG mod I goals  Skilled Therapeutic Interventions/Progress Updates: Patient received sitting on the EOB. Patient agreeable to OT treatment; wanting to walk with the rollator. Patient with good safety and use of rollator ambulating around obstacles in the hall way. Patient reports feeling good with progress but continues to fatigue and have muscle tightness along hamstrings on BLE's that she feels limits her return to her PLOF and ability to perform IADL's as she did prior. Patient educated on simple stretching exercises to help alleviate some of the muscle discomfort she reports having increased from the day prior. Able to follow demo for LE stretch sitting on seat of rollator locked and backed up against the wall. Continue working towards improved strength and endurance so patient can achieve her LTG's and regain independence.     Therapy Documentation Precautions:  Precautions Precautions: Fall Precaution Comments: numbness in bilateral LEs Restrictions Weight Bearing Restrictions Per Provider Order: No    Pain: Patient report soreness along thoracic spine MHP relieved some of the pain and muscle tension.      Therapy/Group: Individual Therapy  Warnell Forester 09/19/2023, 3:49 PM

## 2023-09-19 NOTE — Progress Notes (Signed)
Physical Therapy Session Note  Patient Details  Name: Stacy Kelley MRN: [id number] Date of Birth: [birth year]  Today's Date: 09/19/2023 PT Individual Time: 1100-1155 PT Individual Time Calculation (min): 55 min   Short Term Goals: Week 1:  PT Short Term Goal 1 (Week 1): STG's=LTG's due to ELOS  Skilled Therapeutic Interventions/Progress Updates:    Chart reviewed and pt agreeable to therapy. Pt received seated in recliner with no c/o pain. Also of note, pt complaining of tightness sensation in BLE and trunk with muscle spasms that pt attributes to increased activity the day prior. Session focused on amb endurance and independence. Pt initiated session with amb >1027ft for using sup + rollator, with amb including navigation around obstacles. Pt noted to have decreased gait speed, but good safety awareness with use of rollator. Pt then completed dynamic balance tasks of toe tapping to cone with noted difficulty with LLE targeting and RLE single leg balance, requiring minA. Pt then continued to report high discomfort with BLE tightness. Pt returned to room and LPN informed of muscle spasms with potential need for hotpack. Pt continued session with balance practice of neutral stance with eyes closed, narrow stance, and staggered stance. Pt noted to still require minA for RLE single leg balance. At end of session, pt was left seated EOB with alarm engaged, nurse call bell and all needs in reach.     Therapy Documentation Precautions:  Precautions Precautions: Fall Precaution Comments: numbness in bilateral LEs Restrictions Weight Bearing Restrictions Per Provider Order: No General:      Therapy/Group: Individual Therapy  Dionne Milo, PT, DPT 09/19/2023, 12:58 PM

## 2023-09-19 NOTE — Progress Notes (Signed)
PROGRESS NOTE   Subjective/Complaints:  Pt feels a little sore in thighs from exercise yesterday    ROS:  Pt denies SOB, abd pain, CP, N/V/C/D, and vision changes  Negative except for HPI  Objective:   No results found.  Recent Labs    09/16/23 1221 09/17/23 1719  WBC 7.2 5.4  HGB 13.4 13.1  HCT 40.3 39.6  PLT 351 319   Recent Labs    09/16/23 1221 09/17/23 1719  NA 133* 130*  K 3.5 3.0*  CL 99 102  CO2 20* 21*  GLUCOSE 97 103*  BUN 14 9  CREATININE 0.60 0.65  CALCIUM 10.0 9.3    Intake/Output Summary (Last 24 hours) at 09/19/2023 1208 Last data filed at 09/18/2023 1800 Gross per 24 hour  Intake 240 ml  Output --  Net 240 ml        Physical Exam: Vital Signs Blood pressure 120/88, pulse 97, temperature 98.5 F (36.9 C), resp. rate 17, height 5\' 2"  (1.575 m), weight 43.2 kg, last menstrual period 11/14/2022, SpO2 100%.   General: No acute distress Mood and affect are appropriate Heart: Regular rate and rhythm no rubs murmurs or extra sounds Lungs: Clear to auscultation, breathing unlabored, no rales or wheezes Abdomen: Positive bowel sounds, soft nontender to palpation, nondistended Extremities: No clubbing, cyanosis, or edema Skin: No evidence of breakdown, no evidence of rash Neurologic: Cranial nerves II through XII intact, motor strength is 5/5 in bilateral deltoid, bicep, tricep, grip, hip flexor, knee extensors, ankle dorsiflexor and plantar flexor DTRs 3+ at knees, no clonus at ankles  Musculoskeletal: mild tenderness in bilateral VMOs. No joint swelling Prior exam  Neuro: Sensation to light touch decreased to absent from umbilicus to toes B/L   Assessment/Plan: 1. Functional deficits which require 3+ hours per day of interdisciplinary therapy in a comprehensive inpatient rehab setting. Physiatrist is providing close team supervision and 24 hour management of active medical problems  listed below. Physiatrist and rehab team continue to assess barriers to discharge/monitor patient progress toward functional and medical goals  Care Tool:  Bathing    Body parts bathed by patient: Right arm, Left arm, Right lower leg, Abdomen, Front perineal area, Chest, Left upper leg, Left lower leg, Face, Buttocks, Right upper leg (per patient report)         Bathing assist Assist Level: Contact Guard/Touching assist     Upper Body Dressing/Undressing Upper body dressing   What is the patient wearing?: Hospital gown only, Pull over shirt    Upper body assist Assist Level: Set up assist    Lower Body Dressing/Undressing Lower body dressing      What is the patient wearing?: Pants, Underwear/pull up (per patient report)     Lower body assist Assist for lower body dressing: Contact Guard/Touching assist     Toileting Toileting    Toileting assist Assist for toileting: Contact Guard/Touching assist     Transfers Chair/bed transfer  Transfers assist     Chair/bed transfer assist level: Contact Guard/Touching assist     Locomotion Ambulation   Ambulation assist      Assist level: Contact Guard/Touching assist Assistive device: No Device Max distance: >  150 ft   Walk 10 feet activity   Assist     Assist level: Contact Guard/Touching assist Assistive device: No Device   Walk 50 feet activity   Assist    Assist level: Contact Guard/Touching assist Assistive device: No Device    Walk 150 feet activity   Assist    Assist level: Contact Guard/Touching assist Assistive device: No Device    Walk 10 feet on uneven surface  activity   Assist     Assist level: Minimal Assistance - Patient > 75%     Wheelchair     Assist Is the patient using a wheelchair?: No Type of Wheelchair: Manual    Wheelchair assist level: Dependent - Patient 0% Max wheelchair distance: 200 ft    Wheelchair 50 feet with 2 turns  activity    Assist        Assist Level: Dependent - Patient 0%   Wheelchair 150 feet activity     Assist      Assist Level: Dependent - Patient 0%   Blood pressure 120/88, pulse 97, temperature 98.5 F (36.9 C), resp. rate 17, height 5\' 2"  (1.575 m), weight 43.2 kg, last menstrual period 11/14/2022, SpO2 100%.  Medical Problem List and Plan: 1. 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  Antithrombotics: -DVT/anticoagulation:  Pharmaceutical: Lovenox added today 12/19- pt felt Lovenox made her mood depressed- doesn't want anymore- we discussed her risk for DVT is very high- however she decided against discussing further today- will wait on Eliquis or changing Lovenox for today.   12/20- pt doesn't want to discuss today- refusing lovenox             -antiplatelet therapy: N/A 3. Pain Management:  tylenol prn.  Add BenGay for LE pain 4. Mood/Behavior/Sleep:  LCSW to follow for evaluation and support.              -antipsychotic agents: N/A             --has been unable to sleep  (anxiety?).  --Patient aware that Melatonin added prn for sleep  as well as gabapentin prn for pain/neuropathy.  5. Neuropsych/cognition: This patient is capable of making decisions on her own behalf. 6. Skin/Wound Care: Routine pressure relief measures.  7. Fluids/Electrolytes/Nutrition: Monitor I/O. Check CMET in am   12/19- looks good- con't to monitor 8. SLE/Sjogren's: Continue Plaquenil as is willing to take it.              --discussed importance of compliance and also will work on simplifying medication regimen.    9. Vitamin D deficiency @ 10.32: Will change Vit D3 to Ergocalciferol due to severity of deficiency.    10. Recurrent right axilla hidradenitis: Had episode last month that was drained in ED but did not take atbx  due to concerns of SE.  --Continue Bactrim dose #2 but developed petechiae last night. Willing to take it with benadryl premedication but would like it drained before it gets big again --Consult surgery for I & D?  12/19- con't Bactrim with benadryl, but explained would likely need ot change ABX  if doesn't improve- Surgery called per d/w PA.  12/20- I think pt would benefit from Clindamycin, however she doesn't want to change ABX- I explained will not stop Bactrim until I&D/no more drainage.  Don't have end date on Bactrim due to this.  12/21 Gen Surg consult appreciated no I and D recommended as there is spont drainage , cont abx another 5-7 d- may d/c on 12/26 11. Neurogenic bowel: Will check KUB--start bowel program with suppository at nights   12/19- willing to do every other night- will change order  12/20- pt refused bowel program- had small BM last night- needs a larger BM, but  cannot make pt do bowel program, take PO bowel meds.  12. Neurogenic bladder: Discussed toileting every 4 hours and DV/Coude bladder             --Monitor voiding with PVR checks.    12/19- No caths required- bladder scans/PVR's zero so far! 13. 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  --Protonix and Carafate was added but has been refusing it so will d/c   14.. Hypokalemia: Has been repleted. Recheck in am--refused labs today   12/19- K+ this Am 3.5-   12/20- K_ 3.0- will replete 40 mEq x2 and recheck Monday- if still down, might need to put on scheduled K+.  15.. Hyponatremia: Will check follow up labs in am.   12/19- Na 133- overall stable- will monitor  12/20- Na down to 130- if drops further, will need to intervene.  16..  Hx B12 deficiency:470--> WNL on recheck.      LOS: 3 days A FACE TO FACE EVALUATION WAS PERFORMED  Erick Colace 09/19/2023, 12:08 PM

## 2023-09-20 NOTE — Progress Notes (Signed)
PROGRESS NOTE   Subjective/Complaints:  Ambulated to PT with supervision  Has been refusing lovenox x several days Still with thigh soreness but no increased weakness, discussed that Crista Elliot is written BID   ROS:  Pt denies SOB, abd pain, CP, N/V/C/D, and vision changes  Negative except for HPI  Objective:   No results found.  Recent Labs    09/17/23 1719  WBC 5.4  HGB 13.1  HCT 39.6  PLT 319   Recent Labs    09/17/23 1719  NA 130*  K 3.0*  CL 102  CO2 21*  GLUCOSE 103*  BUN 9  CREATININE 0.65  CALCIUM 9.3    Intake/Output Summary (Last 24 hours) at 09/20/2023 0854 Last data filed at 09/19/2023 1000 Gross per 24 hour  Intake 240 ml  Output --  Net 240 ml        Physical Exam: Vital Signs Blood pressure 130/84, pulse 97, temperature 98.5 F (36.9 C), temperature source Oral, resp. rate 16, height 5\' 2"  (1.575 m), weight 43.2 kg, last menstrual period 11/14/2022, SpO2 100%.   General: No acute distress Mood and affect are appropriate Heart: Regular rate and rhythm no rubs murmurs or extra sounds Lungs: Clear to auscultation, breathing unlabored, no rales or wheezes Abdomen: Positive bowel sounds, soft nontender to palpation, nondistended Extremities: No clubbing, cyanosis, or edema Skin: No evidence of breakdown, no evidence of rash Neurologic: Cranial nerves II through XII intact, motor strength is 5/5 in bilateral deltoid, bicep, tricep, grip, hip flexor, knee extensors, ankle dorsiflexor and plantar flexor DTRs 3+ at knees, no clonus at ankles  Musculoskeletal: mild tenderness in bilateral VMOs. No joint swelling Prior exam  Neuro: Sensation to light touch decreased to absent from umbilicus to toes B/L   Assessment/Plan: 1. Functional deficits which require 3+ hours per day of interdisciplinary therapy in a comprehensive inpatient rehab setting. Physiatrist is providing close team  supervision and 24 hour management of active medical problems listed below. Physiatrist and rehab team continue to assess barriers to discharge/monitor patient progress toward functional and medical goals  Care Tool:  Bathing    Body parts bathed by patient: Right arm, Left arm, Right lower leg, Abdomen, Front perineal area, Chest, Left upper leg, Left lower leg, Face, Buttocks, Right upper leg (per patient report)         Bathing assist Assist Level: Contact Guard/Touching assist     Upper Body Dressing/Undressing Upper body dressing   What is the patient wearing?: Hospital gown only, Pull over shirt    Upper body assist Assist Level: Set up assist    Lower Body Dressing/Undressing Lower body dressing      What is the patient wearing?: Pants, Underwear/pull up (per patient report)     Lower body assist Assist for lower body dressing: Contact Guard/Touching assist     Toileting Toileting    Toileting assist Assist for toileting: Contact Guard/Touching assist     Transfers Chair/bed transfer  Transfers assist     Chair/bed transfer assist level: Contact Guard/Touching assist     Locomotion Ambulation   Ambulation assist      Assist level: Contact Guard/Touching assist Assistive device:  No Device Max distance: >150 ft   Walk 10 feet activity   Assist     Assist level: Contact Guard/Touching assist Assistive device: No Device   Walk 50 feet activity   Assist    Assist level: Contact Guard/Touching assist Assistive device: No Device    Walk 150 feet activity   Assist    Assist level: Contact Guard/Touching assist Assistive device: No Device    Walk 10 feet on uneven surface  activity   Assist     Assist level: Minimal Assistance - Patient > 75%     Wheelchair     Assist Is the patient using a wheelchair?: No Type of Wheelchair: Manual    Wheelchair assist level: Dependent - Patient 0% Max wheelchair distance: 200 ft     Wheelchair 50 feet with 2 turns activity    Assist        Assist Level: Dependent - Patient 0%   Wheelchair 150 feet activity     Assist      Assist Level: Dependent - Patient 0%   Blood pressure 130/84, pulse 97, temperature 98.5 F (36.9 C), temperature source Oral, resp. rate 16, height 5\' 2"  (1.575 m), weight 43.2 kg, last menstrual period 11/14/2022, SpO2 100%.  Medical Problem List and Plan: 1. 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  Antithrombotics: -DVT/anticoagulation:  Pharmaceutical: Lovenox d/ced amb distance >100'              -antiplatelet therapy: N/A 3. Pain Management:  tylenol prn.  Add BenGay for LE pain 4. Mood/Behavior/Sleep:  LCSW to follow for evaluation and support.              -antipsychotic agents: N/A             --has been unable to sleep  (anxiety?).  --Patient aware that Melatonin added prn for sleep  as well as gabapentin prn for pain/neuropathy.  5. Neuropsych/cognition: This patient is capable of making decisions on her own behalf. 6. Skin/Wound Care: Routine pressure relief measures.  7. Fluids/Electrolytes/Nutrition: Monitor I/O. Check BMET in am   Poor recorded fluid intake  Last 2 meals 85-100% 8. SLE/Sjogren's: Continue Plaquenil as is willing to take it.              --discussed importance of compliance and also will work on simplifying medication regimen.    9. Vitamin D deficiency @ 10.32: Will change Vit D3 to Ergocalciferol due to severity of deficiency.    10. Recurrent right axilla hidradenitis: Had episode last month that was drained in ED but did not take atbx due to concerns of SE.  --Continue Bactrim dose #2 but developed petechiae last night. Willing to take it with benadryl premedication but would like it drained before it gets big  again --Consult surgery for I & D?  12/19- con't Bactrim with benadryl, but explained would likely need ot change ABX if doesn't improve- Surgery called per d/w PA.  12/20- I think pt would benefit from Clindamycin, however she doesn't want to change ABX- I explained will not stop Bactrim until I&D/no more drainage.  Don't have end date on Bactrim due to this.  12/21 Gen Surg consult appreciated no I and D recommended as there is spont drainage , cont abx another 5-7 d- may d/c on 12/26 11. Neurogenic bowel: Will check KUB--start bowel program with suppository at nights   12/19- willing to do every other night- will change order  12/20- pt refused bowel program- had small BM last night- needs a larger BM, but  cannot make pt do bowel program, take PO bowel meds.  12. Neurogenic bladder: Discussed toileting every 4 hours and DV/Coude bladder             --Monitor voiding with PVR checks.    12/19- No caths required- bladder scans/PVR's zero so far! 13. 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  --Protonix and Carafate was added but has been refusing it so will d/c   14.. Hypokalemia: Has been repleted. Recheck in am--refused labs today   12/19- K+ this Am 3.5-   12/20- K_ 3.0- will replete 40 mEq x2 and recheck Monday- if still down, might need to put on scheduled K+.  15.. Hyponatremia: Will check follow up labs in am.   12/19- Na 133- overall stable- will monitor  12/20- Na down to 130- if drops further, will need to intervene.  16..  Hx B12 deficiency:470--> WNL on recheck.      LOS: 4 days A FACE TO FACE EVALUATION WAS PERFORMED  Erick Colace 09/20/2023, 8:54 AM

## 2023-09-20 NOTE — Progress Notes (Signed)
Physical Therapy Session Note  Patient Details  Name: Stacy Kelley MRN: [id number] Date of Birth: Jul 03, 1991  Today's Date: 09/20/2023 PT Individual Time: 0804-0900, 1304- 1345  PT Individual Time Calculation (min): 56 min, 41 min    Short Term Goals: Week 1:  PT Short Term Goal 1 (Week 1): STG's=LTG's due to ELOS  Skilled Therapeutic Interventions/Progress Updates:      Therapy Documentation Precautions:  Precautions Precautions: Fall Precaution Comments: numbness in bilateral LEs Restrictions Weight Bearing Restrictions Per Provider Order: No   Treatment Session 1:   PT session with emphasis on gait and balance training to improve proprioceptive awareness, pt without verbal reports of pain. Pt supervision with bed mobility, transfers, and gait >500 ft without AD. PT provided external feedback through bilateral 2.5# ankle weights for NMR with gait throughout unit.  Pt reports LE muscle tightness and instructed with visual cues to perform standing hamstring and gastroc flexibility training at table with wedge positioned under patients feet. Pt requires CGA/min A for balance with eyes closed standing on airex foam pad barefoot to heighten proprioceptive feedback. Pt educated on sensory desensitization and PT performed lotion application and external feedback with washcloth for increased textured for pain management. Pt left in discussion with MD.     Treatment Session 2:   Pt agreeable to PT session with emphasis on lateral hip stability and strengthening and performed following exercises to address strength deficits supervision - min A:   -green thera band monster walks x 25 ft   -green thera band lateral stepping x 25 ft   -lateral squats with green band resistance on compliant surface x 8  -fire hydrants with unilateral UE support x 5 (bilateral)   -farmer's carry with 10# dumb bell x 25 ft  Pt supervision with bed mobility, transfers and gait >150 ft in session with AD. Pt  left seated with all needs in reach.   Therapy/Group: Individual Therapy  Truitt Leep Truitt Leep PT, DPT  09/20/2023, 12:05 PM

## 2023-09-20 NOTE — Progress Notes (Signed)
Occupational Therapy Session Note  Patient Details  Name: Stacy Kelley MRN: [id number] Date of Birth: [birth year]  Today's Date: 09/20/2023 OT Individual Time: 4098-1191 & 4782-9562 OT Individual Time Calculation (min): 60 min & 42 min   Short Term Goals: Week 1:  OT Short Term Goal 1 (Week 1): STG=LTG mod I goals  Skilled Therapeutic Interventions/Progress Updates:      Therapy Documentation Precautions:  Precautions Precautions: Fall Precaution Comments: numbness in bilateral LEs Restrictions Weight Bearing Restrictions Per Provider Order: No Session 1 General: "I am just ready to go home." Pt seated in recliner upon OT arrival, agreeable to OT.  Pain: no pain reported  ADL: Pt and OT discussed D/C planning. Pt educated on different between OPPT/OT versus home health and pros/cons. Pt after education reported prefers OP and can get a ride. Pt discussing progress in therapy and OT providing therapeutic listening. OT discussed IADLs including child care and education on ways to modify. OT dicussed wearing shoes for mobility in the home d/t decreased sensation with feet.   Pt seated in recliner at end of session with W/C alarm donned, call light within reach and 4Ps assessed.    Session 2 General: "I'm tired this afternoon." Pt supine in bed upon OT arrival, agreeable to OT session.  Pain: no pain reported  ADL: OT and pt discussed equipment for D/C. OT recommending TTB in order for increased safety and independence with bathing once D/C.   Exercises: Pt issued UE theraband HEP in order to increase functional strength, andurance and activity tolerance in order to increase independence in ADLs such as bathing. Pt issued blue theraband and discussed direction/technique of exercises, demonstrating verbal understanding. Pt completed 3x10 exercises at bed level listed below: -elbow extensions - shoulder horizontal abduction -bicep curls  -shoulder flexion -diagonal shoulder  flexion -external rotation   Pt supine in bed with bed alarm activated, 2 bed rails up, call light within reach and 4Ps assessed.   Therapy/Group: Individual Therapy  Velia Meyer, OTD, OTR/L 09/20/2023, 7:44 PM

## 2023-09-20 NOTE — Progress Notes (Signed)
Patient refuses bed alarm. Educated by NT

## 2023-09-20 NOTE — Progress Notes (Signed)
Pt refused Bowel program. Pt stated that she has been having bowel movements on her own and doesn't feel like she needs it. Last [facility] 09/19/23

## 2023-09-21 ENCOUNTER — Other Ambulatory Visit (HOSPITAL_COMMUNITY): Payer: Self-pay

## 2023-09-21 DIAGNOSIS — G373 Acute transverse myelitis in demyelinating disease of central nervous system: Secondary | ICD-10-CM | POA: Diagnosis not present

## 2023-09-21 LAB — BASIC METABOLIC PANEL
Anion gap: 9 (ref 5–15)
BUN: 7 mg/dL (ref 6–20)
CO2: 22 mmol/L (ref 22–32)
Calcium: 9.2 mg/dL (ref 8.9–10.3)
Chloride: 102 mmol/L (ref 98–111)
Creatinine, Ser: 0.88 mg/dL (ref 0.44–1.00)
GFR, Estimated: >60 mL/min (ref >60)
Glucose, Bld: 91 mg/dL (ref 70–99)
Potassium: 3.8 mmol/L (ref 3.5–5.1)
Sodium: 133 mmol/L — ABNORMAL LOW (ref 135–145)

## 2023-09-21 LAB — OLIGOCLONAL BANDS, CSF + SERM

## 2023-09-21 MED ORDER — MUSCLE RUB 10-15 % EX CREA
1.0000 | TOPICAL_CREAM | CUTANEOUS | 0 refills | Status: AC | PRN
  Filled 2023-09-21: qty 85, 15d supply, fill #0

## 2023-09-21 MED ORDER — HYDROXYCHLOROQUINE SULFATE 200 MG PO TABS
200.0000 mg | ORAL_TABLET | Freq: Every day | ORAL | 0 refills | Status: DC
  Filled 2023-09-21: qty 30, 30d supply, fill #0

## 2023-09-21 MED ORDER — SULFAMETHOXAZOLE-TRIMETHOPRIM 800-160 MG PO TABS
1.0000 | ORAL_TABLET | Freq: Two times a day (BID) | ORAL | 0 refills | Status: DC
  Filled 2023-09-21: qty 5, 3d supply, fill #0

## 2023-09-21 MED ORDER — ACETAMINOPHEN 325 MG PO TABS
325.0000 mg | ORAL_TABLET | ORAL | 0 refills | Status: AC | PRN
  Filled 2023-09-21: qty 100, 9d supply, fill #0

## 2023-09-21 MED ORDER — CLINDAMYCIN PHOSPHATE 1 % EX GEL
CUTANEOUS | 0 refills | Status: DC
  Filled 2023-09-21: qty 60, fill #0

## 2023-09-21 MED ORDER — FERROUS GLUCONATE 324 (38 FE) MG PO TABS
324.0000 mg | ORAL_TABLET | Freq: Every day | ORAL | 0 refills | Status: DC
  Filled 2023-09-21: qty 30, 30d supply, fill #0

## 2023-09-21 MED ORDER — VITAMIN D (ERGOCALCIFEROL) 1.25 MG (50000 UNIT) PO CAPS
50000.0000 [IU] | ORAL_CAPSULE | ORAL | 0 refills | Status: DC
  Filled 2023-09-21: qty 4, 28d supply, fill #0

## 2023-09-21 MED ORDER — DIPHENHYDRAMINE HCL 25 MG PO TABS
25.0000 mg | ORAL_TABLET | Freq: Two times a day (BID) | ORAL | 0 refills | Status: AC
  Filled 2023-09-21: qty 10, 5d supply, fill #0

## 2023-09-21 MED ORDER — SENNOSIDES-DOCUSATE SODIUM 8.6-50 MG PO TABS
2.0000 | ORAL_TABLET | ORAL | 0 refills | Status: AC
  Filled 2023-09-21: qty 30, 30d supply, fill #0

## 2023-09-21 MED ORDER — HYDROCORTISONE (PERIANAL) 2.5 % EX CREA
1.0000 | TOPICAL_CREAM | Freq: Two times a day (BID) | CUTANEOUS | 0 refills | Status: AC
  Filled 2023-09-21: qty 30, 10d supply, fill #0

## 2023-09-21 NOTE — Discharge Summary (Signed)
Physician Discharge Summary  Patient ID: Stacy Kelley MRN: [id number] DOB/AGE: Sep 14, 1991 32 y.o.  Admit date: 09/16/2023 Discharge date: 09/22/2023  Discharge Diagnoses:  Principal Problem:   Transverse myelitis [facility])   Discharged Condition: poor  Significant Diagnostic Studies: DG Abd 1 View Result Date: 09/16/2023 CLINICAL DATA:  Constipation EXAM: ABDOMEN - 1 VIEW COMPARISON:  None Available. FINDINGS: Nonobstructive bowel gas pattern. Normal colonic stool burden. Visualized osseous structures are within normal limits. IMPRESSION: Negative. Electronically Signed   By: Charline Bills M.D.   On: 09/16/2023 22:39     Labs:  Basic Metabolic Panel: Recent Labs  Lab 09/16/23 1221 09/17/23 1719 09/21/23 0540  NA 133* 130* 133*  K 3.5 3.0* 3.8  CL 99 102 102  CO2 20* 21* 22  GLUCOSE 97 103* 91  BUN 14 9 7   CREATININE 0.60 0.65 0.88  CALCIUM 10.0 9.3 9.2    CBC: Recent Labs  Lab 09/16/23 1221 09/17/23 1719  WBC 7.2 5.4  NEUTROABS  --  3.6  HGB 13.4 13.1  HCT 40.3 39.6  MCV 83.4 83.5  PLT 351 319    CBG: No results for input(s): "GLUCAP" in the last 168 hours.  Brief HPI:   ELLY Kelley is a 32 y.o. female of SLE -non compliance with plaquenil, Sjogren's syndrome, genital herpes, Vitamin D deficiency; who was admitted on 09/09/2023 with a day history of BLE numbness, excruciating abdominal pain with weakness.  She was hypokalemic at admission and MRI spine done revealing extensive spinal cord lesion spanning from T1-T9 suggestive of autoimmune disease.  LP done revealing elevated CSF IgG G at 11.3, serum IgG-2050, glucose 40 and protein 52.  Neurology felt that workup was consistent with acute transverse myelitis and she was treated with Solu-Medrol x 5 days.  Abdominal ultrasound was negative for gallbladder wall thickening, stones and negative Murphy sign noted.    Dr. Amada Jupiter recommended vitamin D supplementation as well as plasmapheresis however  patient deferred latter as she her symptoms were improving and she was able to mobilize with therapy.  She did develop drainage from axilla due to hidradenitis and Septra DS was added for treatment.  She continued to be limited by weakness, sensory deficits as well as saddle anesthesia and was requiring contact-guard to min assist overall.  She was independent prior to admission.  CIR was recommended due to functional decline   Hospital Course: [name] was admitted to rehab 09/16/2023 for inpatient therapies to consist of PT, ST and OT at least three hours five days a week. Past admission physiatrist, therapy team and rehab RN have worked together to provide customized collaborative inpatient rehab.  Follow-up check of CBC shows H&H to be stable.  Check of electrolytes showed mild hyponatremia as well as hypokalemia that has resolved with supplementation.  Repeat check of electrolytes shows sodium improving to 133 and likely due to side effects of Septra.  She was started on bowel program but has been refusing this. KUB ordered due to reports of constipation and showed normal stool burden.  Laxatives have been adjusted and she was advised to continue this at least every other day as needed.  Bladder function was monitored with PVR checks and she is voiding without difficulty.  She has been educated on importance of compliance with Plaquenil and following up with rheumatology after discharge.  Blood pressures were monitored on TID basis and have been stable.  Warm compresses were ordered to right axilla to help with drainage.  She was also encouraged to be compliant with antibiotic use.  Benadryl was scheduled 30 minutes prior to Septra DS to avoid side effects and she has been tolerating this.  She was able to completely express the area and was able to express large amount of sebaceous material with discontinuation of drainage.  General surgery was consulted for input felt no I&D was needed due to  improvement in the area.  They did recommend adding clindamycin ointment 4 times daily for 6 weeks and to follow-up with dermatology if does not improve.  She was advised to complete antibiotic course after discharge and continue with warm compresses until area fully cleared. Ergocalciferol added due to low vitamin D level.    Rehab course: During patient's stay in rehab weekly team conferences were held to monitor patient's progress, set goals and discuss barriers to discharge. At admission, patient required min assist with ADL tasks and contact-guard assist with mobility. She  has had improvement in activity tolerance, balance, postural control as well as ability to compensate for deficits.  She is able to complete ADL tasks at modified independent level.  She is modified independent for transfers and to ambulate 500 feet without assistive device.  Discharge disposition: 01-Home or Self Care  Diet: Regular  Special Instructions: No driving or strenuous activity.  Recommend repeat check BMET 10-14 days to follow-up on sodium and potassium levels.  Discharge Instructions     Ambulatory referral to Neurology   Complete by: As directed    An appointment is requested in approximately: 4 weeks   Ambulatory referral to Physical Medicine Rehab   Complete by: As directed       Allergies as of 09/22/2023       Reactions   Prednisone Other (See Comments)   Feet became swollen and blotchy rashes appeared on the feet shortly after starting this   Augmentin [amoxicillin-pot Clavulanate] Swelling, Rash   Doxycycline Swelling, Rash, Other (See Comments)   Feet became swollen and blotchy rashes appeared on the feet shortly after starting this   Penicillins Swelling, Rash, Other (See Comments)   Feet became swollen and blotchy rashes appeared on the feet shortly after starting this        Medication List     STOP taking these medications    diphenhydrAMINE 25 mg capsule Commonly known as:  BENADRYL Replaced by: diphenhydrAMINE 25 MG tablet   ibuprofen 800 MG tablet Commonly known as: ADVIL   pantoprazole 40 MG tablet Commonly known as: PROTONIX   potassium chloride SA 20 MEQ tablet Commonly known as: KLOR-CON M   sucralfate 1 GM/10ML suspension Commonly known as: CARAFATE       TAKE these medications    acetaminophen 325 MG tablet Commonly known as: TYLENOL Take 1-2 tablets (325-650 mg total) by mouth every 4 (four) hours as needed for mild pain (pain score 1-3).   clindamycin 1 % gel Commonly known as: CLINDAGEL Apply to area right armpit  2 times daily for 6 weeks   diphenhydrAMINE 25 MG tablet Commonly known as: BENADRYL Take 1 tablet (25 mg total) by mouth 2 (two) times daily. Replaces: diphenhydrAMINE 25 mg capsule Notes to patient: Take before antibiotic to prevent rash/side effect   ferrous gluconate 324 MG tablet Commonly known as: FERGON Take 1 tablet (324 mg total) by mouth daily with breakfast. What changed:  when to take this reasons to take this Notes to patient: For anemia.    hydrocortisone 2.5 % rectal cream Commonly known  as: Anusol-HC Place 1 Application rectally 2 (two) times daily.   hydroxychloroquine 200 MG tablet Commonly known as: PLAQUENIL Take 1 tablet (200 mg total) by mouth daily.   Muscle Rub 10-15 % Crea Apply 1 Application topically as needed for muscle pain. Notes to patient: To the thighs for numbness/pain   senna-docusate 8.6-50 MG tablet Commonly known as: Senokot-S Take 2 tablets by mouth every other day. Notes to patient: For constipation   sulfamethoxazole-trimethoprim 800-160 MG tablet Commonly known as: BACTRIM DS Take 1 tablet by mouth every 12 (twelve) hours.   Vitamin D (Ergocalciferol) 1.25 MG (50000 UNIT) Caps capsule Commonly known as: DRISDOL Take 1 capsule (50,000 Units total) by mouth every 7 (seven) days. Start taking on: September 24, 2023 Notes to patient: Once a week on Thursdays         Follow-up Information     Jordan Hawks, PA-C Follow up.   Specialty: Physician Assistant Why: Call in 1-2 days for post hospital follow up Contact information: [address] [place] [address] [phone]         Genice Rouge, MD Follow up.   Specialty: Physical Medicine and Rehabilitation Why: office will call you with follow up appointment Contact information: [address] [place] [address] [phone]         [facility] Follow up.   Why: office will call you with follow up appointment Contact information: [address]     [address] [place] [address] [phone]        [facility] [place] Follow up.   Why: Call in 1-2 days for post hospital follow up Contact information: [address] [place] [address] [phone]                 Signed: Jacquelynn Cree 09/22/2023, 3:41 PM

## 2023-09-21 NOTE — Progress Notes (Signed)
PROGRESS NOTE   Subjective/Complaints:  Pt reports LBM 2 days ago- doesn't want bowel program.  Did "own surgery' on R axilla- by squeezing it- stopped draining.  OK with ABX til 12/26-  Peeing fine.  ROS:  Pt denies SOB, abd pain, CP, N/V/C/D, and vision changes   Negative except for HPI  Objective:   No results found.  No results for input(s): "WBC", "HGB", "HCT", "PLT" in the last 72 hours.  Recent Labs    09/21/23 0540  NA 133*  K 3.8  CL 102  CO2 22  GLUCOSE 91  BUN 7  CREATININE 0.88  CALCIUM 9.2    Intake/Output Summary (Last 24 hours) at 09/21/2023 0900 Last data filed at 09/20/2023 1800 Gross per 24 hour  Intake 474 ml  Output --  Net 474 ml        Physical Exam: Vital Signs Blood pressure 120/84, pulse 91, temperature 98.4 F (36.9 C), resp. rate 18, height 5\' 2"  (1.575 m), weight 43.2 kg, last menstrual period 11/14/2022, SpO2 100%.    General: awake, alert, appropriate, sitting up in bed; ate ~25% of tray so far; NAD HENT: conjugate gaze; oropharynx moist CV: regular rate and rhythm- rat ein 90's; no JVD Pulmonary: CTA B/L; no W/R/R- good air movement GI: soft, NT, ND, (+)BS Psychiatric: appropriate Neurological: Ox3 Skin- R axilla- infected hair follicle looks MUCH better- stopped draining and scabbing over- less swelling and no redness/heat Neurologic: Cranial nerves II through XII intact, motor strength is 5/5 in bilateral deltoid, bicep, tricep, grip, hip flexor, knee extensors, ankle dorsiflexor and plantar flexor DTRs 3+ at knees, no clonus at ankles  Musculoskeletal: mild tenderness in bilateral VMOs. No joint swelling Prior exam  Neuro: Sensation to light touch decreased to absent from umbilicus to toes B/L   Assessment/Plan: 1. Functional deficits which require 3+ hours per day of interdisciplinary therapy in a comprehensive inpatient rehab setting. Physiatrist is  providing close team supervision and 24 hour management of active medical problems listed below. Physiatrist and rehab team continue to assess barriers to discharge/monitor patient progress toward functional and medical goals  Care Tool:  Bathing    Body parts bathed by patient: Right arm, Left arm, Right lower leg, Abdomen, Front perineal area, Chest, Left upper leg, Left lower leg, Face, Buttocks, Right upper leg (per patient report)         Bathing assist Assist Level: Contact Guard/Touching assist     Upper Body Dressing/Undressing Upper body dressing   What is the patient wearing?: Hospital gown only, Pull over shirt    Upper body assist Assist Level: Set up assist    Lower Body Dressing/Undressing Lower body dressing      What is the patient wearing?: Pants, Underwear/pull up (per patient report)     Lower body assist Assist for lower body dressing: Contact Guard/Touching assist     Toileting Toileting    Toileting assist Assist for toileting: Contact Guard/Touching assist     Transfers Chair/bed transfer  Transfers assist     Chair/bed transfer assist level: Contact Guard/Touching assist     Locomotion Ambulation   Ambulation assist      Assist  level: Contact Guard/Touching assist Assistive device: No Device Max distance: >150 ft   Walk 10 feet activity   Assist     Assist level: Contact Guard/Touching assist Assistive device: No Device   Walk 50 feet activity   Assist    Assist level: Contact Guard/Touching assist Assistive device: No Device    Walk 150 feet activity   Assist    Assist level: Contact Guard/Touching assist Assistive device: No Device    Walk 10 feet on uneven surface  activity   Assist     Assist level: Minimal Assistance - Patient > 75%     Wheelchair     Assist Is the patient using a wheelchair?: No Type of Wheelchair: Manual    Wheelchair assist level: Dependent - Patient 0% Max  wheelchair distance: 200 ft    Wheelchair 50 feet with 2 turns activity    Assist        Assist Level: Dependent - Patient 0%   Wheelchair 150 feet activity     Assist      Assist Level: Dependent - Patient 0%   Blood pressure 120/84, pulse 91, temperature 98.4 F (36.9 C), resp. rate 18, height 5\' 2"  (1.575 m), weight 43.2 kg, last menstrual period 11/14/2022, SpO2 100%.  Medical Problem List and Plan: 1. 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  Antithrombotics: -DVT/anticoagulation:  Pharmaceutical: Lovenox d/ced amb distance >100'              -antiplatelet therapy: N/A 3. Pain Management:  tylenol prn.  Add BenGay for LE pain 4. Mood/Behavior/Sleep:  LCSW to follow for evaluation and support.              -antipsychotic agents: N/A             --has been unable to sleep  (anxiety?).  --Patient aware that Melatonin added prn for sleep  as well as gabapentin prn for pain/neuropathy.  5. Neuropsych/cognition: This patient is capable of making decisions on her own behalf. 6. Skin/Wound Care: Routine pressure relief measures.  7. Fluids/Electrolytes/Nutrition: Monitor I/O. Check BMET in am   Poor recorded fluid intake  Last 2 meals 85-100% 8. SLE/Sjogren's: Continue Plaquenil as is willing to take it.              --discussed importance of compliance and also will work on simplifying medication regimen.    9. Vitamin D deficiency @ 10.32: Will change Vit D3 to Ergocalciferol due to severity of deficiency.    10. Recurrent right axilla hidradenitis: Had episode last month that was drained in ED but did not take atbx due to concerns of SE.  --Continue Bactrim dose #2 but developed petechiae last night. Willing to take it with benadryl premedication but would like it drained before it  gets big again --Consult surgery for I & D?  12/19- con't Bactrim with benadryl, but explained would likely need ot change ABX if doesn't improve- Surgery called per d/w PA.  12/20- I think pt would benefit from Clindamycin, however she doesn't want to change ABX- I explained will not stop  Bactrim until I&D/no more drainage.  Don't have end date on Bactrim due to this.  12/21 Gen Surg consult appreciated no I and D recommended as there is spont drainage , cont abx another 5-7 d- may d/c on 12/26 12/23- it's healing over- drainage stopped it appears- con't ABX til 12/26 11. Neurogenic bowel: Will check KUB--start bowel program with suppository at nights   12/19- willing to do every other night- will change order  12/20- pt refused bowel program- had small BM last night- needs a larger BM, but  cannot make pt do bowel program, take PO bowel meds.   12/23- having more regular BM's per pt- doesn't want bowel program 12. Neurogenic bladder: Discussed toileting every 4 hours and DV/Coude bladder             --Monitor voiding with PVR checks.    12/19- No caths required- bladder scans/PVR's zero so far!  12/23- no caths- will stop bladder scans 13. 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  --Protonix and Carafate was added but has been refusing it so will d/c   14.. Hypokalemia: Has been repleted. Recheck in am--refused labs today   12/19- K+ this Am 3.5-   12/20- K_ 3.0- will replete 40 mEq x2 and recheck Monday- if still down, might need to put on scheduled K+. 12/23- K+ 3.8- doing better- won't start K+ regularly.   15.. Hyponatremia: Will check follow up labs in am.   12/19- Na 133- overall stable- will monitor  12/20- Na down to 130- if drops further, will need to intervene.   12/23- Na 133- doing better 16.Marland Kitchen  Hx B12 deficiency:470--> WNL on recheck.      LOS: 5 days A FACE TO FACE EVALUATION WAS PERFORMED  Shiven Junious 09/21/2023, 9:00 AM

## 2023-09-21 NOTE — Progress Notes (Signed)
Physical Therapy Discharge Summary  Patient Details  Name: Stacy Kelley MRN: [id number] Date of Birth: 1991/04/05  Date of Discharge from PT service:September 21, 2023  Today's Date: 09/21/2023 PT Individual Time: 0800-0900 PT Individual Time Calculation (min): 60 min    Patient has met 7 of 7 long term goals due to improved postural control and improved coordination.  Patient to discharge at an ambulatory level Modified Independent.    Reasons goals not met: N/A  Recommendation:  Patient will benefit from ongoing skilled PT services in outpatient setting to continue to advance safe functional mobility, address ongoing impairments in balance, coordination, activity tolerance, and minimize fall risk.  Equipment: rollator  Reasons for discharge: treatment goals met and discharge from hospital  Patient/family agrees with progress made and goals achieved: Yes  PT Discharge Pain Interference Pain Interference Pain Effect on Sleep: 0. Does not apply - I have not had any pain or hurting in the past 5 days Pain Interference with Therapy Activities: 0. Does not apply - I have not received rehabilitationtherapy in the past 5 days Pain Interference with Day-to-Day Activities: 1. Rarely or not at all Cognition Overall Cognitive Status: Within Functional Limits for tasks assessed Arousal/Alertness: Awake/alert Orientation Level: Oriented X4 Selective Attention: Appears intact Memory: Appears intact Awareness: Appears intact Problem Solving: Appears intact Safety/Judgment: Appears intact Sensation Sensation Light Touch: Impaired Detail Peripheral sensation comments: B LE numbness Light Touch Impaired Details: Impaired RLE;Impaired LLE Proprioception: Appears Intact Additional Comments: BLE numbness, reports feeling better than when first came to hospital Coordination Gross Motor Movements are Fluid and Coordinated: Yes Fine Motor Movements are Fluid and Coordinated: Yes Finger  Nose Finger Test: [facility] Heel Shin Test: [facility] Motor  Motor Motor: Within Functional Limits Motor - Skilled Clinical Observations: WFL- abnormal sensation in BLE  Mobility Bed Mobility Bed Mobility: Rolling Right;Rolling Left;Supine to Sit;Sit to Supine Rolling Right: Independent with assistive device Rolling Left: Independent with assistive device Supine to Sit: Independent with assistive device Sit to Supine: Independent with assistive device Transfers Transfers: Stand to Sit;Sit to Stand Sit to Stand: Independent with assistive device Stand to Sit: Independent with assistive device Transfer (Assistive device): None Locomotion  Gait Ambulation: Yes Gait Assistance: Independent with assistive device Gait Distance (Feet): 500 Feet Assistive device: None Gait Gait: Yes Gait Pattern: Within Functional Limits Gait Pattern: Within Functional Limits Gait velocity: decreased Stairs / Additional Locomotion Stairs: Yes Stairs Assistance: Independent with assistive device Number of Stairs: 22 Height of Stairs: 7.5 Ramp: Independent with assistive device Curb: Independent with assistive device Pick up small object from the floor assist level: Independent with assistive device Wheelchair Mobility Wheelchair Mobility: No  Trunk/Postural Assessment  Cervical Assessment Cervical Assessment: Within Functional Limits Thoracic Assessment Thoracic Assessment: Within Functional Limits Lumbar Assessment Lumbar Assessment: Within Functional Limits Postural Control Postural Control: Within Functional Limits  Balance Balance Balance Assessed: Yes Static Sitting Balance Static Sitting - Balance Support: No upper extremity supported Static Sitting - Level of Assistance: 7: Independent Dynamic Sitting Balance Dynamic Sitting - Balance Support: No upper extremity supported Dynamic Sitting - Level of Assistance: 7: Independent Static Standing Balance Static Standing - Balance Support: No  upper extremity supported Static Standing - Level of Assistance: 6: Modified independent (Device/Increase time) Dynamic Standing Balance Dynamic Standing - Balance Support: No upper extremity supported;During functional activity Dynamic Standing - Level of Assistance: 6: Modified independent (Device/Increase time) Extremity Assessment  RUE Assessment RUE Assessment: Within Functional Limits LUE Assessment LUE Assessment: Within Functional Limits  RLE Assessment RLE Assessment: Within Functional Limits LLE Assessment LLE Assessment: Within Functional Limits   Pt agreeable to PT session with emphasis on global strength, balance, and NMR to address high level coordination deficits. Pt mod I with bed mobility, transfers, gait >200 ft without AD. Pt performed squats x 6, squats holding tidal tank 2 x 5, and squat with tidal tank OH press 2 x 5 on airex foam pad to challenge balance. Pt navigated 6 inch hurdles x 8 with bilateral 2.5 # ankle weights in forward and laterally with CGA/min A. Pt returned to room and left with all needs in reach.    Truitt Leep Truitt Leep PT, DPT  09/21/2023, 12:20 PM

## 2023-09-21 NOTE — Progress Notes (Signed)
Patient ID: Stacy Kelley, female   DOB: 28-Nov-1990, 32 y.o.   MRN: [id number]  Rollator ordered through [employer].

## 2023-09-21 NOTE — Progress Notes (Signed)
Inpatient Rehabilitation Care Coordinator Discharge Note   Patient Details  Name: Stacy Kelley MRN: [id number] Date of Birth: [birth year]   Discharge location: D/C home with boyfriend  Length of Stay: 6 Days  Discharge activity level: MOD I  Home/community participation: Boyfriend and family  Patient response WU:[id number] Literacy - How often do you need to have someone help you when you read instructions, pamphlets, or other written material from your doctor or pharmacy?: Never  Patient response YN:WGNFAO Isolation - How often do you feel lonely or isolated from those around you?: Rarely  Services provided included: MD, RD, PT, OT, SLP, RN, CM, TR, Pharmacy, Neuropsych, SW  Financial Services:  Field seismologist Utilized: Media planner [place] Medicaid [employer] [place]  Choices offered to/list presented to: Patient  Follow-up services arranged:  DME, Outpatient Home Health Agency: N/A  Outpatient Servicies: [facility] PT OT DME : [employer]: Rollator and TTB    Patient response to transportation need: Is the patient able to respond to transportation needs?: Yes In the past 12 months, has lack of transportation kept you from medical appointments or from getting medications?: No In the past 12 months, has lack of transportation kept you from meetings, work, or from getting things needed for daily living?: No   Patient/Family verbalized understanding of follow-up arrangements:  Yes  Individual responsible for coordination of the follow-up plan: patient [phone]  Confirmed correct DME delivered: Andria Rhein 09/21/2023    Comments (or additional information):  Summary of Stay    Date/Time Discharge Planning CSW  09/18/23 1125 Home with boyfriend and two children-boyfriend does work but other family members will be checking on her. Kids are out for Christmas break RGD       Andria Rhein

## 2023-09-21 NOTE — Progress Notes (Signed)
Occupational Therapy Discharge Summary  Patient Details  Name: Stacy Kelley MRN: [id number] Date of Birth: [birth year]  Date of Discharge from OT service:September 21, 2023  Today's Date: 09/21/2023 OT Individual Time: 1000-1015 & 1300-1415 OT Individual Time Calculation (min): 15 min & 75 min  and Today's Date: 09/21/2023 OT Missed Time: 60 Minutes Missed Time Reason: Patient fatigue;Patient unwilling/refused to participate without medical reason (pt with increased fatige this AM)   Patient has met 8 of 8 long term goals due to improved activity tolerance, improved balance, postural control, and ability to compensate for deficits.  Patient to discharge at overall Modified Independent level.  Patient's care partner is independent to provide the necessary physical assistance at discharge.    Reasons goals not met: All goals met  Recommendation:  Patient will benefit from ongoing skilled OT services in outpatient setting to continue to advance functional skills in the area of BADL and Reduce care partner burden.  Equipment: TTB  Reasons for discharge: treatment goals met and discharge from hospital  Patient/family agrees with progress made and goals achieved: Yes  OT Discharge Precautions/Restrictions  Precautions Precautions: Fall Precaution Comments: numbness in bilateral LEs Restrictions Weight Bearing Restrictions Per Provider Order: No ADL ADL Eating: Modified independent Where Assessed-Eating: Chair Grooming: Modified independent Where Assessed-Grooming: Standing at sink Upper Body Bathing: Modified independent Where Assessed-Upper Body Bathing: Shower Lower Body Bathing: Modified independent Where Assessed-Lower Body Bathing: Shower Upper Body Dressing: Modified independent (Device) Where Assessed-Upper Body Dressing: Chair Lower Body Dressing: Modified independent Where Assessed-Lower Body Dressing: Sitting at sink, Standing at sink Toileting: Modified  independent Where Assessed-Toileting: Teacher, adult education: Engineer, agricultural Method: Insurance claims handler: Modified independent Web designer Method: Ship broker: Walk in shower, Insurance underwriter: Modified independent Film/video editor Method: Designer, industrial/product: Emergency planning/management officer ADL Comments: see care tool for patient report; pt didnt bathe or dress at time of eval Vision Baseline Vision/History: 0 No visual deficits Patient Visual Report: No change from baseline Vision Assessment?: No apparent visual deficits Perception  Perception: Within Functional Limits Praxis Praxis: WFL Cognition Cognition Overall Cognitive Status: Within Functional Limits for tasks assessed Arousal/Alertness: Awake/alert Orientation Level: Person;Place;Situation Person: Oriented Place: Oriented Situation: Oriented Selective Attention: Appears intact Awareness: Appears intact Problem Solving: Appears intact Safety/Judgment: Appears intact Brief Interview for Mental Status (BIMS) Repetition of Three Words (First Attempt): 3 Temporal Orientation: Year: Correct Temporal Orientation: Month: Accurate within 5 days Temporal Orientation: Day: Correct Recall: "Sock": Yes, no cue required Recall: "Blue": Yes, no cue required Recall: "Bed": Yes, no cue required BIMS Summary Score: 15 Sensation Sensation Light Touch: Impaired Detail Light Touch Impaired Details: Impaired RLE;Impaired LLE Proprioception: Appears Intact Additional Comments: BLE numbness, reports feeling better than when first came to hospital Coordination Gross Motor Movements are Fluid and Coordinated: Yes Fine Motor Movements are Fluid and Coordinated: Yes Motor  Motor Motor: Within Functional Limits Motor - Skilled Clinical Observations: WFL- abnormal sensation in BLE Mobility  Bed Mobility Bed Mobility: Rolling  Right;Rolling Left;Supine to Sit;Sit to Supine Rolling Right: Independent with assistive device Rolling Left: Independent with assistive device Supine to Sit: Independent with assistive device Sit to Supine: Independent with assistive device Transfers Sit to Stand: Independent with assistive device Stand to Sit: Independent with assistive device  Trunk/Postural Assessment  Cervical Assessment Cervical Assessment: Within Functional Limits Thoracic Assessment Thoracic Assessment: Within Functional Limits Lumbar Assessment Lumbar Assessment: Within Functional Limits  Balance Balance Balance Assessed: Yes Static  Sitting Balance Static Sitting - Balance Support: No upper extremity supported Static Sitting - Level of Assistance: 6: Modified independent (Device/Increase time) Dynamic Sitting Balance Dynamic Sitting - Balance Support: No upper extremity supported Dynamic Sitting - Level of Assistance: 6: Modified independent (Device/Increase time) Static Standing Balance Static Standing - Balance Support: No upper extremity supported Static Standing - Level of Assistance: 6: Modified independent (Device/Increase time) Dynamic Standing Balance Dynamic Standing - Balance Support: No upper extremity supported;During functional activity Dynamic Standing - Level of Assistance: 6: Modified independent (Device/Increase time) Extremity/Trunk Assessment RUE Assessment RUE Assessment: Within Functional Limits LUE Assessment LUE Assessment: Within Functional Limits Session 1 General: "I am just so tired" Pt supine in bed upon OT arrival, agreeable to OT session. Missed 60 min d/t increased fatigue.  Pain: no pain reported  Other Treatments: Pt and OT discussed DME needs for D/C d/t OT recommending TTB. SW reported ordering TTB for pt, although undelivered, SW reported will look into it before D/C.   Pt supine in bed with bed alarm activated, 2 bed rails up, call light within reach and 4Ps  assessed.   Session 2 General: "I am excited to go home." Pt supine in bed upon OT arrival, agreeable to OT session.  Pain: no pain reported  ADL: Pt completed full ADL this date at Mod I level for all tasks. Pt completed shower sitting on shower seat with back. No AE required for ADL. Pt received required DME for D/C in AM. Pt ambulating community distances without AD and LOB with Mod I and decreased pace.   Other Treatments: Pt reported children's father is able to assist with higher level IADLs such as grocery shopping/cleaning/child care if necessary.   Pt seated in recliner at end of session with W/C alarm donned, call light within reach and 4Ps assessed.    Velia Meyer, OTD, OTR/L 09/21/2023, 6:00 PM

## 2023-09-21 NOTE — Plan of Care (Signed)
  Problem: RH Balance Goal: LTG Patient will maintain dynamic standing with ADLs (OT) Description: LTG:  Patient will maintain dynamic standing balance with assist during activities of daily living (OT)  Outcome: Completed/Met Flowsheets (Taken 09/17/2023 2112 by Melonie Florida, OT) LTG: Pt will maintain dynamic standing balance during ADLs with: Independent with assistive device   Problem: RH Bathing Goal: LTG Patient will bathe all body parts with assist levels (OT) Description: LTG: Patient will bathe all body parts with assist levels (OT) Outcome: Completed/Met Flowsheets (Taken 09/17/2023 2112 by Melonie Florida, OT) LTG: Pt will perform bathing with assistance level/cueing: Independent with assistive device    Problem: RH Dressing Goal: LTG Patient will perform upper body dressing (OT) Description: LTG Patient will perform upper body dressing with assist, with/without cues (OT). Outcome: Completed/Met Flowsheets (Taken 09/17/2023 2112 by Melonie Florida, OT) LTG: Pt will perform upper body dressing with assistance level of: Independent with assistive device Goal: LTG Patient will perform lower body dressing w/assist (OT) Description: LTG: Patient will perform lower body dressing with assist, with/without cues in positioning using equipment (OT) Outcome: Completed/Met Flowsheets (Taken 09/17/2023 2112 by Melonie Florida, OT) LTG: Pt will perform lower body dressing with assistance level of: Independent with assistive device   Problem: RH Toileting Goal: LTG Patient will perform toileting task (3/3 steps) with assistance level (OT) Description: LTG: Patient will perform toileting task (3/3 steps) with assistance level (OT)  Outcome: Completed/Met Flowsheets (Taken 09/17/2023 2112 by Melonie Florida, OT) LTG: Pt will perform toileting task (3/3 steps) with assistance level: Independent with assistive device   Problem: RH Laundry Goal: LTG Patient will perform laundry  w/assist, cues (OT) Description: LTG: Patient will perform laundry with assistance, with/without cues (OT). Outcome: Completed/Met Flowsheets (Taken 09/17/2023 2112 by Melonie Florida, OT) LTG: Pt will perform laundry with assistance level of: Independent with assistive device   Problem: RH Toilet Transfers Goal: LTG Patient will perform toilet transfers w/assist (OT) Description: LTG: Patient will perform toilet transfers with assist, with/without cues using equipment (OT) Outcome: Completed/Met Flowsheets (Taken 09/17/2023 2112 by Melonie Florida, OT) LTG: Pt will perform toilet transfers with assistance level of: Independent with assistive device   Problem: RH Tub/Shower Transfers Goal: LTG Patient will perform tub/shower transfers w/assist (OT) Description: LTG: Patient will perform tub/shower transfers with assist, with/without cues using equipment (OT) Outcome: Completed/Met Flowsheets (Taken 09/17/2023 2112 by Melonie Florida, OT) LTG: Pt will perform tub/shower stall transfers with assistance level of: Independent with assistive device

## 2023-09-22 ENCOUNTER — Other Ambulatory Visit (HOSPITAL_COMMUNITY): Payer: Self-pay

## 2023-09-22 DIAGNOSIS — G373 Acute transverse myelitis in demyelinating disease of central nervous system: Secondary | ICD-10-CM | POA: Diagnosis not present

## 2023-09-22 MED ORDER — CLINDAMYCIN PHOSPHATE 1 % EX GEL
CUTANEOUS | 1 refills | Status: AC
Start: 2023-09-22 — End: 2024-09-20

## 2023-09-22 NOTE — Progress Notes (Signed)
PROGRESS NOTE   Subjective/Complaints:  Worried about a "knot" on inner edge of lower breast-  Still numb in legs and privates  Asking about disability- explained don't think she will be disabled more than 1 year, so unfortunately, won't qualify for  disability.   ROS:   Pt denies SOB, abd pain, CP, N/V/C/D, and vision changes  Negative except for HPI  Objective:   No results found.  No results for input(s): "WBC", "HGB", "HCT", "PLT" in the last 72 hours.  Recent Labs    09/21/23 0540  NA 133*  K 3.8  CL 102  CO2 22  GLUCOSE 91  BUN 7  CREATININE 0.88  CALCIUM 9.2    Intake/Output Summary (Last 24 hours) at 09/22/2023 0957 Last data filed at 09/21/2023 1800 Gross per 24 hour  Intake 477 ml  Output --  Net 477 ml        Physical Exam: Vital Signs Blood pressure 122/72, pulse (!) 109, temperature 98.4 F (36.9 C), temperature source Oral, resp. rate 18, height 5\' 2"  (1.575 m), weight 43.2 kg, last menstrual period 11/14/2022, SpO2 100%.     General: awake, alert, appropriate, just woke up- supine in bed; moving in bed easily; NAD HENT: conjugate gaze; oropharynx moist CV: regular to slightly tachycardic rate and regular rhythm; no JVD Pulmonary: CTA B/L; no W/R/R- good air movement GI: soft, NT, ND, (+)BS- normoactive Psychiatric: appropriate Neurological: Ox3 Still numb from waist down Skin- R axilla- infected hair follicle looks MUCH better- stopped draining and scabbing over- less swelling and no redness/heat- much better Breast- a thickening of the tissue on L inner distal breast- <1/2 cm in diameter- feels more like breast tissue thickening than "knot" Neurologic: Cranial nerves II through XII intact, motor strength is 5/5 in bilateral deltoid, bicep, tricep, grip, hip flexor, knee extensors, ankle dorsiflexor and plantar flexor DTRs 3+ at knees, no clonus at ankles  Musculoskeletal: mild  tenderness in bilateral VMOs. No joint swelling Prior exam  Neuro: Sensation to light touch decreased to absent from umbilicus to toes B/L   Assessment/Plan: 1. Functional deficits which require 3+ hours per day of interdisciplinary therapy in a comprehensive inpatient rehab setting. Physiatrist is providing close team supervision and 24 hour management of active medical problems listed below. Physiatrist and rehab team continue to assess barriers to discharge/monitor patient progress toward functional and medical goals  Care Tool:  Bathing    Body parts bathed by patient: Right arm, Left arm, Right lower leg, Abdomen, Front perineal area, Chest, Left upper leg, Left lower leg, Face, Buttocks, Right upper leg         Bathing assist Assist Level: Independent with assistive device     Upper Body Dressing/Undressing Upper body dressing   What is the patient wearing?: Pull over shirt, Button up shirt    Upper body assist Assist Level: Independent with assistive device    Lower Body Dressing/Undressing Lower body dressing      What is the patient wearing?: Pants, Underwear/pull up     Lower body assist Assist for lower body dressing: Independent with assitive device     Toileting Toileting    Toileting assist  Assist for toileting: Independent with assistive device     Transfers Chair/bed transfer  Transfers assist     Chair/bed transfer assist level: Independent with assistive device     Locomotion Ambulation   Ambulation assist      Assist level: Independent with assistive device Assistive device: No Device Max distance: >150 ft   Walk 10 feet activity   Assist     Assist level: Independent with assistive device Assistive device: No Device   Walk 50 feet activity   Assist    Assist level: Independent with assistive device Assistive device: No Device    Walk 150 feet activity   Assist    Assist level: Independent with assistive  device Assistive device: No Device    Walk 10 feet on uneven surface  activity   Assist     Assist level: Independent with assistive device     Wheelchair     Assist Is the patient using a wheelchair?: No Type of Wheelchair: Manual    Wheelchair assist level: Dependent - Patient 0% Max wheelchair distance: 200 ft    Wheelchair 50 feet with 2 turns activity    Assist        Assist Level: Dependent - Patient 0%   Wheelchair 150 feet activity     Assist      Assist Level: Dependent - Patient 0%   Blood pressure 122/72, pulse (!) 109, temperature 98.4 F (36.9 C), temperature source Oral, resp. rate 18, height 5\' 2"  (1.575 m), weight 43.2 kg, last menstrual period 11/14/2022, SpO2 100%.  Medical Problem List and Plan: 1. 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  Antithrombotics: -DVT/anticoagulation:  Pharmaceutical: Lovenox d/ced amb distance >100'              -antiplatelet therapy: N/A 3. Pain Management:  tylenol prn.  Add BenGay for LE pain 4. Mood/Behavior/Sleep:  LCSW to follow for evaluation and support.              -antipsychotic agents: N/A             --has been unable to sleep  (anxiety?).  --Patient aware that Melatonin added prn for sleep  as well as gabapentin prn for pain/neuropathy.  5. Neuropsych/cognition: This patient is capable of making decisions on her own behalf. 6. Skin/Wound Care: Routine pressure relief measures.  7. Fluids/Electrolytes/Nutrition: Monitor I/O. Check BMET in am   Poor recorded fluid intake  Last 2 meals 85-100% 8. SLE/Sjogren's: Continue Plaquenil as is willing to take it.              --discussed importance of compliance and also will work on simplifying medication regimen.    9. Vitamin D deficiency @ 10.32: Will change Vit D3 to Ergocalciferol  due to severity of deficiency.    10. Recurrent right axilla hidradenitis: Had episode last month that was drained in ED but did not take atbx due to concerns of SE.  --Continue Bactrim dose #2 but developed petechiae last night. Willing to take it with benadryl premedication but would like it drained before it gets big again --Consult surgery for I &  D?  12/19- con't Bactrim with benadryl, but explained would likely need ot change ABX if doesn't improve- Surgery called per d/w PA.  12/20- I think pt would benefit from Clindamycin, however she doesn't want to change ABX- I explained will not stop Bactrim until I&D/no more drainage.  Don't have end date on Bactrim due to this.  12/21 Gen Surg consult appreciated no I and D recommended as there is spont drainage , cont abx another 5-7 d- may d/c on 12/26 12/23- it's healing over- drainage stopped it appears- con't ABX til 12/26 12/24- looks good- will go home on PO ABX 11. Neurogenic bowel: Will check KUB--start bowel program with suppository at nights   12/19- willing to do every other night- will change order  12/20- pt refused bowel program- had small BM last night- needs a larger BM, but  cannot make pt do bowel program, take PO bowel meds.   12/23- having more regular BM's per pt- doesn't want bowel program 12. Neurogenic bladder: Discussed toileting every 4 hours and DV/Coude bladder             --Monitor voiding with PVR checks.    12/19- No caths required- bladder scans/PVR's zero so far!  12/23- no caths- will stop bladder scans 13. 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  --Protonix and Carafate was added but has been refusing it so will d/c   14.. Hypokalemia: Has been repleted. Recheck in am--refused labs today   12/19- K+ this Am 3.5-   12/20- K_ 3.0- will replete 40 mEq x2 and recheck Monday- if still down, might need to put on scheduled K+. 12/23- K+ 3.8- doing better- won't start K+ regularly.   15..  Hyponatremia: Will check follow up labs in am.   12/19- Na 133- overall stable- will monitor  12/20- Na down to 130- if drops further, will need to intervene.   12/23- Na 133- doing better 16.Marland Kitchen  Hx B12 deficiency:470--> WNL on recheck.   The patient is medically ready for discharge to home and will need follow-up with [facility]. In addition, they will need to follow up with their PCP, Neurology- sees PCP in the next 30 days.     LOS: 6 days A FACE TO FACE EVALUATION WAS PERFORMED  Mariyam Remington 09/22/2023, 9:57 AM

## 2023-09-22 NOTE — Progress Notes (Signed)
Inpatient Rehabilitation Care Coordinator Discharge Note   Patient Details  Name: Stacy Kelley MRN: [id number] Date of Birth: 1991/05/13   Discharge location: D/C home with boyfriend  Length of Stay: 6 Days  Discharge activity level: MOD I  Home/community participation: Boyfriend and family  Patient response CZ:YSAYTK Literacy - How often do you need to have someone help you when you read instructions, pamphlets, or other written material from your doctor or pharmacy?: Never  Patient response ZS:WFUXNA Isolation - How often do you feel lonely or isolated from those around you?: Rarely  Services provided included: MD, RD, PT, OT, SLP, RN, CM, TR, Pharmacy, Neuropsych, SW  Financial Services:  Field seismologist Utilized: Media planner [place] [employer] [place]  Choices offered to/list presented to: Patient  Follow-up services arranged:  DME, Outpatient Home Health Agency: N/A  Outpatient Servicies: [facility] PT OT DME : [employer]    Patient response to transportation need: Is the patient able to respond to transportation needs?: Yes In the past 12 months, has lack of transportation kept you from medical appointments or from getting medications?: No In the past 12 months, has lack of transportation kept you from meetings, work, or from getting things needed for daily living?: No   Patient/Family verbalized understanding of follow-up arrangements:  Yes  Individual responsible for coordination of the follow-up plan: patient [phone]  Confirmed correct DME delivered: Gretchen Short 09/22/2023    Comments (or additional information):  Summary of Stay    Date/Time Discharge Planning CSW  09/21/23 1534 Home with boyfriend and two children-boyfriend does work but other family members will be checking on her. Kids are out for Christmas break AAC  09/18/23 1125 Home with boyfriend and two children-boyfriend does work but other  family members will be checking on her. Kids are out for Christmas break RGD       Chloe Flis A Lula Olszewski

## 2023-09-22 NOTE — Progress Notes (Signed)
Patient ID: Stacy Kelley, female   DOB: 02-15-1991, 32 y.o.   MRN: [id number]  This SW covering for primary SW, Becky Dupree.  SW answered question in relation to disability, and informed pt on how to follow-up with social security with in person or by phone. SW reiterated outpatient therapies in place and this information is listed in discharge instructions. No other questions/concerns reported.   Stacy Kelley, MSW, LCSW Office: [phone] Cell: [phone] Fax: [phone]

## 2023-09-22 NOTE — Progress Notes (Signed)
Patient stated she will have family pick her up for discharge around 3 pm. Requested to speak with social worker about outpatient rehab.   Social worker on call was notified.

## 2023-09-22 NOTE — Progress Notes (Signed)
Inpatient Rehabilitation Discharge Medication Review by a Pharmacist    A complete drug regimen review was completed for this patient to identify any potential clinically significant medication issues.   High Risk Drug Classes Is patient taking? Indication by Medication  Antipsychotic No   Anticoagulant No   Antibiotic Yes Septra(Bactrim)-recurrent right axilla hidradeniti   Opioid No    Antiplatelet No    Hypoglycemics/insulin No    Vasoactive Medication No    Chemotherapy No    Other No Acetaminophen- pain management Diphenhydramine-premed prior to Septra(bactrim) dosing to prevent adverse reaction. Ferrous gluconate- anemia Hydrocortisone rectal cream - hemorrhoids Hydroxychloroquine- SLE/ Sjogren's Vitamin D- supplement for vit D deficiency Muscle rub cream- muscle pain Senna-docusate-constipation         Type of Medication Issue Identified Description of Issue Recommendation(s)  Drug Interaction(s) (clinically significant)        Duplicate Therapy        Allergy        No Medication Administration End Date        Incorrect Dose        Additional Drug Therapy Needed        Significant med changes from prior encounter (inform family/care partners about these prior to discharge). Protonix and Carafate were discontinued.     Other            Clinically significant medication issues were identified that warrant physician communication and completion of prescribed/recommended actions by midnight of the next day:  No   Name of provider notified for urgent issues identified:    Provider Method of Notification:        Pharmacist comments:    Time spent performing this drug regimen review (minutes): 15    Noah Delaine, [place] Clinical Pharmacist 09/22/2023 7:59 AM

## 2023-09-22 NOTE — Progress Notes (Signed)
Patient seen by PA for discharge. No further questions.  Transferred off floor with belongings assisted by tech.

## 2023-10-01 ENCOUNTER — Other Ambulatory Visit (HOSPITAL_COMMUNITY): Payer: Self-pay | Admitting: Physician Assistant

## 2023-10-01 ENCOUNTER — Ambulatory Visit: Payer: Medicaid Other | Attending: Physician Assistant

## 2023-10-01 DIAGNOSIS — G373 Acute transverse myelitis in demyelinating disease of central nervous system: Secondary | ICD-10-CM

## 2023-10-01 DIAGNOSIS — R293 Abnormal posture: Secondary | ICD-10-CM | POA: Insufficient documentation

## 2023-10-01 DIAGNOSIS — R2689 Other abnormalities of gait and mobility: Secondary | ICD-10-CM | POA: Diagnosis present

## 2023-10-01 DIAGNOSIS — M6281 Muscle weakness (generalized): Secondary | ICD-10-CM | POA: Insufficient documentation

## 2023-10-01 DIAGNOSIS — R2681 Unsteadiness on feet: Secondary | ICD-10-CM | POA: Diagnosis present

## 2023-10-01 DIAGNOSIS — R262 Difficulty in walking, not elsewhere classified: Secondary | ICD-10-CM | POA: Insufficient documentation

## 2023-10-01 NOTE — Therapy (Signed)
 OUTPATIENT PHYSICAL THERAPY NEURO EVALUATION   Patient Name: Stacy Kelley MRN: [id number] DOB:[birth year], 33 y.o., female Today's Date: 10/01/2023   PCP: Lauraine Burkes, PA-C REFERRING PROVIDER: Toribio Pitch, PA-C  END OF SESSION:  PT End of Session - 10/01/23 1515     Visit Number 1    Number of Visits 13    Date for PT Re-Evaluation 11/13/23    Authorization Type [place] medicaid prepaid    PT Start Time 1516    PT Stop Time 1600    PT Time Calculation (min) 44 min    Equipment Utilized During Treatment Gait belt    Activity Tolerance Patient tolerated treatment well    Behavior During Therapy WFL for tasks assessed/performed             Past Medical History:  Diagnosis Date   Genital herpes    Gonorrhea    Sjogren's syndrome (HCC) 05/12/2022   SLE (systemic lupus erythematosus) (HCC)    Thyromegaly 09/03/2022   Vitamin D  deficiency 06/11/2022   Past Surgical History:  Procedure Laterality Date   CESAREAN SECTION     Patient Active Problem List   Diagnosis Date Noted   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   Acute transverse myelitis (HCC) 09/09/2023   Hypokalemia 09/09/2023   Hyperproteinemia 09/09/2023   Thyromegaly 09/03/2022   Iron  deficiency anemia 06/11/2022   Vitamin D  deficiency 06/11/2022   Systemic lupus erythematosus (HCC) 06/11/2022   Sjogren's syndrome (HCC) 05/12/2022   Hyperpigmentation 07/12/2018   Generalized rash 11/23/2015   NSVD (normal spontaneous vaginal delivery) 05/11/2015    ONSET DATE: 09/18/23 referral  REFERRING DIAG: G37.3 (ICD-10-CM) - Acute transverse myelitis (HCC)   THERAPY DIAG:  Abnormal posture - Plan: PT plan of care cert/re-cert  Difficulty in walking, not elsewhere classified - Plan: PT plan of care cert/re-cert  Muscle weakness (generalized) - Plan: PT plan of care cert/re-cert  Rationale for Evaluation and Treatment: Rehabilitation  SUBJECTIVE:                                                                                                                                                                                              SUBJECTIVE STATEMENT: Patient arrives to clinic alone, no AD. Patient reports numbness/vibration from trunk distally. Reports challenges with her balance and quality of gait persisting.  Pt accompanied by: self  PERTINENT HISTORY: Sjogren's syndrome, SLE, thyromegaly, Vit D deficiency   PAIN:  Are you having pain? No  PRECAUTIONS: Fall   WEIGHT BEARING RESTRICTIONS: No  FALLS: Has patient fallen in last 6 months? No  LIVING ENVIRONMENT: Lives with: lives with their family  Lives in: House/apartment Stairs: Yes: External: 18 steps; can reach both Has following equipment at home: Vannie - 4 wheeled and shower chair  PLOF: Independent; worked at [employer] a and driving.   PATIENT GOALS: to get back active  OBJECTIVE:  Note: Objective measures were completed at Evaluation unless otherwise noted.  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, history of lupus suggesting autoimmune cause  COGNITION: Overall cognitive status: Within functional limits for tasks assessed   SENSATION: Light touch: Impaired  and B LE ~T9 level and distal  COORDINATION: Limited AROM heel/shin, slow figure 8 BLE Impaired RAM B LE  EDEMA:  Reports mild edema B feet  POSTURE: rounded shoulders and forward head   LOWER EXTREMITY MMT:    MMT Right Eval Left Eval  Hip flexion 3 3+  Hip extension    Hip abduction 4- 4-  Hip adduction 4 4  Hip internal rotation    Hip external rotation    Knee flexion 4- 4-  Knee extension 4 4-  Ankle dorsiflexion 4 4  Ankle plantarflexion    Ankle inversion    Ankle eversion    (Blank rows = not tested)  BED MOBILITY:  Reports independent  TRANSFERS: Assistive device utilized: None  Sit to stand: Modified independence Stand to sit: Modified  independence Chair to chair: Modified independence  STAIRS: Level of Assistance: Modified independence Stair Negotiation Technique: Alternating Pattern  with Bilateral Rails Number of Stairs: 4  Height of Stairs: 6   GAIT: Gait pattern: step through pattern, decreased arm swing- Right, decreased arm swing- Left, decreased hip/knee flexion- Right, decreased hip/knee flexion- Left, Right foot flat, Left foot flat, shuffling, wide BOS, poor foot clearance- Right, and poor foot clearance- Left Distance walked: clinic Assistive device utilized: None Level of assistance: Modified independence Comments: slow gait speed  FUNCTIONAL TESTS:   [facility] PT Assessment - 10/01/23 0001       Standardized Balance Assessment   Standardized Balance Assessment Timed Up and Go Test    Five times sit to stand comments  15.75    10 Meter Walk .83m/s      Timed Up and Go Test   Normal TUG (seconds) 15.47      Functional Gait  Assessment   Gait assessed  Yes    Gait Level Surface Walks 20 ft in less than 7 sec but greater than 5.5 sec, uses assistive device, slower speed, mild gait deviations, or deviates 6-10 in outside of the 12 in walkway width.    Change in Gait Speed Able to change speed, demonstrates mild gait deviations, deviates 6-10 in outside of the 12 in walkway width, or no gait deviations, unable to achieve a major change in velocity, or uses a change in velocity, or uses an assistive device.    Gait with Horizontal Head Turns Performs head turns smoothly with slight change in gait velocity (eg, minor disruption to smooth gait path), deviates 6-10 in outside 12 in walkway width, or uses an assistive device.    Gait with Vertical Head Turns Performs task with slight change in gait velocity (eg, minor disruption to smooth gait path), deviates 6 - 10 in outside 12 in walkway width or uses assistive device    Gait and Pivot Turn Turns slowly, requires verbal cueing, or requires several small steps to  catch balance following turn and stop    Step Over Obstacle Is able to step over one shoe box (4.5  in total height) but must slow down and adjust steps to clear box safely. May require verbal cueing.    Gait with Narrow Base of Support Ambulates less than 4 steps heel to toe or cannot perform without assistance.    Gait with Eyes Closed Walks 20 ft, slow speed, abnormal gait pattern, evidence for imbalance, deviates 10-15 in outside 12 in walkway width. Requires more than 9 sec to ambulate 20 ft.    Ambulating Backwards Walks 20 ft, slow speed, abnormal gait pattern, evidence for imbalance, deviates 10-15 in outside 12 in walkway width.    Steps Alternating feet, must use rail.    Total Score 14                                                                                                                                     TREATMENT  N/a eval   PATIENT EDUCATION: Education details: PT POC, exam findings Person educated: Patient Education method: Explanation Education comprehension: verbalized understanding and needs further education  HOME EXERCISE PROGRAM: To be provided  GOALS: Goals reviewed with patient? Yes  SHORT TERM GOALS: Target date: 10/23/23  Pt will be independent with initial HEP for improved functional strength and balance  Baseline: to be provided Goal status: INITIAL  2.  Pt will improve gait speed to >/= .67m/s to demonstrate improved community ambulation  Baseline: .65m/s Goal status: INITIAL  3.  Pt will improve FGA to >/= 19/30 to demonstrate improved balance and reduced fall risk  Baseline: 14/30 Goal status: INITIAL   LONG TERM GOALS: Target date: 11/13/23  Pt will be independent with final HEP for improved functional strength and balance  Baseline: to be provided Goal status: INITIAL  Pt will improve gait speed to >/= .73m/s to demonstrate improved community ambulation  Baseline: .69m/s Goal status: INITIAL  Pt will improve FGA to >/=  24/30 to demonstrate improved balance and reduced fall risk  Baseline: 14/30 Goal status: INITIAL  4. Pt will improve 5x STS to </= 12 sec to demo improved functional LE strength and balance   Baseline: 15.75s   Goal status: INITIAL  5. Pt will improve TUG to </= 12 secs to demonstrated reduced fall risk  Baseline: 15.47s no AD  Goal status: INITIAL    ASSESSMENT:  CLINICAL IMPRESSION: Patient is a 33 y.o. female who was seen today for physical therapy evaluation and treatment for mobility impairments related to dx of Transverse Myelitis. She is grossly ModI sometimes using rollator, though ambulating in clinic without an AD. Five times Sit to Stand Test (FTSS) Method: Use a straight back chair with a solid seat that is 17-18" high. Ask participant to sit on the chair with arms folded across their chest.   Instructions: "Stand up and sit down as quickly as possible 5 times, keeping your arms folded across your chest."   Measurement: Stop timing when the participant touches the chair  in sitting the 5th time.  TIME: 15.75 sec  Cut off scores indicative of increased fall risk: >12 sec CVA, >16 sec PD, >13 sec vestibular (ANPTA Core Set of Outcome Measures for Adults with Neurologic Conditions, 2018). .10 Meter Walk Test: Patient instructed to walk 10 meters (32.8 ft) as quickly and as safely as possible at their normal speed x2 and at a fast speed x2. Time measured from 2 meter mark to 8 meter mark to accommodate ramp-up and ramp-down.  Normal speed: .81m/s Cut off scores: <0.4 m/s = household Ambulator, 0.4-0.8 m/s = limited community Ambulator, >0.8 m/s = community Ambulator, >1.2 m/s = crossing a street, <1.0 = increased fall risk MCID 0.05 m/s (small), 0.13 m/s (moderate), 0.06 m/s (significant)  (ANPTA Core Set of Outcome Measures for Adults with Neurologic Conditions, 2018). Patient completed the Timed Up and Go test (TUG) in 15.47 seconds.  Geriatrics: need for further  assessment of fall risk: >/= 12 sec; Recurrent falls: > 15 sec; Vestibular Disorders fall risk: > 15 sec; Parkinson's Disease fall risk: > 16 sec ([email or web address], 2023). Patient demonstrates increased fall risk as noted by score of 14/30 on  Functional Gait Assessment.   <22/30 = predictive of falls, <20/30 = fall in 6 months, <18/30 = predictive of falls in PD MCID: 5 points stroke population, 4 points geriatric population (ANPTA Core Set of Outcome Measures for Adults with Neurologic Conditions, 2018). Patient would benefit from skilled PT services to address the above mentioned deficits.   OBJECTIVE IMPAIRMENTS: Abnormal gait, decreased activity tolerance, decreased balance, decreased coordination, decreased endurance, decreased knowledge of condition, decreased knowledge of use of DME, decreased mobility, difficulty walking, decreased strength, and impaired sensation.   ACTIVITY LIMITATIONS: carrying, lifting, standing, stairs, transfers, locomotion level, and caring for others  PARTICIPATION LIMITATIONS: meal prep, cleaning, interpersonal relationship, driving, shopping, community activity, and occupation  PERSONAL FACTORS: Age, Social background, Transportation, and 3+ comorbidities: see above  are also affecting patient's functional outcome.   REHAB POTENTIAL: Good  CLINICAL DECISION MAKING: Stable/uncomplicated  EVALUATION COMPLEXITY: Low  PLAN:  PT FREQUENCY: 2x/week  PT DURATION: 6 weeks  PLANNED INTERVENTIONS: 97164- PT Re-evaluation, 97110-Therapeutic exercises, 97530- Therapeutic activity, 97112- Neuromuscular re-education, 97535- Self Care, 02859- Manual therapy, [phone]- Gait training, [phone]- Orthotic Fit/training, [phone]- Canalith repositioning, [phone]- Aquatic Therapy, Patient/Family education, Balance training, Stair training, Dry Needling, Vestibular training, and DME instructions  PLAN FOR NEXT SESSION: HEP, functional strength, balance, power   Delon DELENA Pop,  PT Delon DELENA Pop, PT, DPT, CBIS  10/01/2023, 4:16 PM    For all possible CPT codes, reference the Planned Interventions line above.     Check all conditions that are expected to impact treatment: {Conditions expected to impact treatment:Neurological condition and/or seizures and Social determinants of health   If treatment provided at initial evaluation, no treatment charged due to lack of authorization.

## 2023-10-02 ENCOUNTER — Other Ambulatory Visit (HOSPITAL_COMMUNITY): Payer: Medicaid Other

## 2023-10-06 ENCOUNTER — Ambulatory Visit: Payer: Medicaid Other

## 2023-10-06 DIAGNOSIS — R293 Abnormal posture: Secondary | ICD-10-CM | POA: Diagnosis not present

## 2023-10-06 DIAGNOSIS — M6281 Muscle weakness (generalized): Secondary | ICD-10-CM

## 2023-10-06 DIAGNOSIS — R262 Difficulty in walking, not elsewhere classified: Secondary | ICD-10-CM

## 2023-10-06 NOTE — Therapy (Signed)
 OUTPATIENT PHYSICAL THERAPY NEURO TREATMENT   Patient Name: Stacy Kelley MRN: [id number] DOB:1991-07-30, 33 y.o., female Today's Date: 10/06/2023   PCP: Lauraine Burkes, PA-C REFERRING PROVIDER: Toribio Pitch, PA-C  END OF SESSION:  PT End of Session - 10/06/23 1410     Visit Number 2    Number of Visits 13    Date for PT Re-Evaluation 11/13/23    Authorization Type [place] medicaid prepaid    PT Start Time 1407   patient late   PT Stop Time 1443    PT Time Calculation (min) 36 min    Activity Tolerance Patient tolerated treatment well    Behavior During Therapy WFL for tasks assessed/performed             Past Medical History:  Diagnosis Date   Genital herpes    Gonorrhea    Sjogren's syndrome (HCC) 05/12/2022   SLE (systemic lupus erythematosus) (HCC)    Thyromegaly 09/03/2022   Vitamin D  deficiency 06/11/2022   Past Surgical History:  Procedure Laterality Date   CESAREAN SECTION     Patient Active Problem List   Diagnosis Date Noted   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   Acute transverse myelitis (HCC) 09/09/2023   Hypokalemia 09/09/2023   Hyperproteinemia 09/09/2023   Thyromegaly 09/03/2022   Iron  deficiency anemia 06/11/2022   Vitamin D  deficiency 06/11/2022   Systemic lupus erythematosus (HCC) 06/11/2022   Sjogren's syndrome (HCC) 05/12/2022   Hyperpigmentation 07/12/2018   Generalized rash 11/23/2015   NSVD (normal spontaneous vaginal delivery) 05/11/2015    ONSET DATE: 09/18/23 referral  REFERRING DIAG: G37.3 (ICD-10-CM) - Acute transverse myelitis (HCC)   THERAPY DIAG:  Abnormal posture  Difficulty in walking, not elsewhere classified  Muscle weakness (generalized)  Rationale for Evaluation and Treatment: Rehabilitation  SUBJECTIVE:                                                                                                                                                                                             SUBJECTIVE  STATEMENT: Patient reports doing well. No significant changes. Still with some soreness in her legs/low back. Denies falls.  Pt accompanied by: self  PERTINENT HISTORY: Sjogren's syndrome, SLE, thyromegaly, Vit D deficiency   PAIN:  Are you having pain? No  PRECAUTIONS: Fall  PATIENT GOALS: to get back active  OBJECTIVE:  Note: Objective measures were completed at Evaluation unless otherwise noted.  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, history of lupus suggesting autoimmune cause  TREATMENT  -scifit hills level 1 B UE/LE x10 mins for neural priming -initial HEP (see below)    PATIENT EDUCATION: Education details: initial HEP Person educated: Patient Education method: Explanation Education comprehension: verbalized understanding and needs further education  HOME EXERCISE PROGRAM: Access Code: [email or web address] URL: [email or web address] Date: 10/06/2023 Prepared by: Delon Pop  Exercises - Supine Bridge  - 1 x daily - 7 x weekly - 3 sets - 10 reps - Clamshell  - 1 x daily - 7 x weekly - 3 sets - 10 reps - Bird Dog  - 1 x daily - 7 x weekly - 3 sets - 10 reps - Mini Squat with Counter Support  - 1 x daily - 7 x weekly - 3 sets - 10 reps - Standing March with Counter Support  - 1 x daily - 7 x weekly - 3 sets - 10 reps  GOALS: Goals reviewed with patient? Yes  SHORT TERM GOALS: Target date: 10/23/23  Pt will be independent with initial HEP for improved functional strength and balance  Baseline: to be provided Goal status: INITIAL  2.  Pt will improve gait speed to >/= .69m/s to demonstrate improved community ambulation  Baseline: .59m/s Goal status: INITIAL  3.  Pt will improve FGA to >/= 19/30 to demonstrate improved balance and reduced fall  risk  Baseline: 14/30 Goal status: INITIAL   LONG TERM GOALS: Target date: 11/13/23  Pt will be independent with final HEP for improved functional strength and balance  Baseline: to be provided Goal status: INITIAL  Pt will improve gait speed to >/= .14m/s to demonstrate improved community ambulation  Baseline: .8m/s Goal status: INITIAL  Pt will improve FGA to >/= 24/30 to demonstrate improved balance and reduced fall risk  Baseline: 14/30 Goal status: INITIAL  4. Pt will improve 5x STS to </= 12 sec to demo improved functional LE strength and balance   Baseline: 15.75s   Goal status: INITIAL  5. Pt will improve TUG to </= 12 secs to demonstrated reduced fall risk  Baseline: 15.47s no AD  Goal status: INITIAL    ASSESSMENT:  CLINICAL IMPRESSION: Patient seen for skilled PT session with emphasis on initiating HEP. HEP to address functional LE strength, endurance and ultimately power in LE. Tolerated well with frequent rest breaks due to fatigue. Continue POC.   OBJECTIVE IMPAIRMENTS: Abnormal gait, decreased activity tolerance, decreased balance, decreased coordination, decreased endurance, decreased knowledge of condition, decreased knowledge of use of DME, decreased mobility, difficulty walking, decreased strength, and impaired sensation.   ACTIVITY LIMITATIONS: carrying, lifting, standing, stairs, transfers, locomotion level, and caring for others  PARTICIPATION LIMITATIONS: meal prep, cleaning, interpersonal relationship, driving, shopping, community activity, and occupation  PERSONAL FACTORS: Age, Social background, Transportation, and 3+ comorbidities: see above  are also affecting patient's functional outcome.   REHAB POTENTIAL: Good  CLINICAL DECISION MAKING: Stable/uncomplicated  EVALUATION COMPLEXITY: Low  PLAN:  PT FREQUENCY: 2x/week  PT DURATION: 6 weeks  PLANNED INTERVENTIONS: 97164- PT Re-evaluation, 97110-Therapeutic exercises, 97530- Therapeutic  activity, 97112- Neuromuscular re-education, 97535- Self Care, 02859- Manual therapy, [phone]- Gait training, [phone]- Orthotic Fit/training, [phone]- Canalith repositioning, [phone]- Aquatic Therapy, Patient/Family education, Balance training, Stair training, Dry Needling, Vestibular training, and DME instructions  PLAN FOR NEXT SESSION: HEP, functional strength, balance, power   Delon DELENA Pop, PT Delon DELENA Pop, PT, DPT, CBIS  10/06/2023, 3:14 PM    For all possible CPT codes, reference the Planned Interventions line above.     Check all conditions  that are expected to impact treatment: {Conditions expected to impact treatment:Neurological condition and/or seizures and Social determinants of health   If treatment provided at initial evaluation, no treatment charged due to lack of authorization.

## 2023-10-09 ENCOUNTER — Ambulatory Visit (HOSPITAL_COMMUNITY)
Admission: RE | Admit: 2023-10-09 | Discharge: 2023-10-09 | Disposition: A | Discharge status: Home / Self Care | Payer: Medicaid Other | Source: Ambulatory Visit | Attending: Physician Assistant | Admitting: Physician Assistant

## 2023-10-09 DIAGNOSIS — G373 Acute transverse myelitis in demyelinating disease of central nervous system: Secondary | ICD-10-CM | POA: Insufficient documentation

## 2023-10-09 MED ORDER — GADOBUTROL 1 MMOL/ML IV SOLN
5.0000 mL | Freq: Once | INTRAVENOUS | Status: AC | PRN
  Administered 2023-10-09: 5 mL via INTRAVENOUS

## 2023-10-13 ENCOUNTER — Ambulatory Visit: Payer: Medicaid Other

## 2023-10-15 ENCOUNTER — Ambulatory Visit: Payer: Medicaid Other

## 2023-10-15 DIAGNOSIS — M6281 Muscle weakness (generalized): Secondary | ICD-10-CM

## 2023-10-15 DIAGNOSIS — R262 Difficulty in walking, not elsewhere classified: Secondary | ICD-10-CM

## 2023-10-15 DIAGNOSIS — R293 Abnormal posture: Secondary | ICD-10-CM | POA: Diagnosis not present

## 2023-10-15 NOTE — Therapy (Signed)
OUTPATIENT PHYSICAL THERAPY NEURO TREATMENT   Patient Name: Stacy Kelley MRN: [id number] DOB:1991-03-14, 33 y.o., female Today's Date: 10/15/2023   PCP: Ralene Ok, PA-C REFERRING PROVIDER: Mariam Dollar, PA-C  END OF SESSION:  PT End of Session - 10/15/23 1402     Visit Number 3    Number of Visits 13    Date for PT Re-Evaluation 11/13/23    Authorization Type [place] medicaid prepaid    PT Start Time 1401    PT Stop Time 1450    PT Time Calculation (min) 49 min    Activity Tolerance Patient tolerated treatment well    Behavior During Therapy WFL for tasks assessed/performed             Past Medical History:  Diagnosis Date   Genital herpes    Gonorrhea    Sjogren's syndrome (HCC) 05/12/2022   SLE (systemic lupus erythematosus) (HCC)    Thyromegaly 09/03/2022   Vitamin D deficiency 06/11/2022   Past Surgical History:  Procedure Laterality Date   CESAREAN SECTION     Patient Active Problem List   Diagnosis Date Noted   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   Acute transverse myelitis (HCC) 09/09/2023   Hypokalemia 09/09/2023   Hyperproteinemia 09/09/2023   Thyromegaly 09/03/2022   Iron deficiency anemia 06/11/2022   Vitamin D deficiency 06/11/2022   Systemic lupus erythematosus (HCC) 06/11/2022   Sjogren's syndrome (HCC) 05/12/2022   Hyperpigmentation 07/12/2018   Generalized rash 11/23/2015   NSVD (normal spontaneous vaginal delivery) 05/11/2015    ONSET DATE: 09/18/23 referral  REFERRING DIAG: G37.3 (ICD-10-CM) - Acute transverse myelitis (HCC)   THERAPY DIAG:  Abnormal posture  Difficulty in walking, not elsewhere classified  Muscle weakness (generalized)  Rationale for Evaluation and Treatment: Rehabilitation  SUBJECTIVE:                                                                                                                                                                                             SUBJECTIVE STATEMENT: Patient  reports doing well. Does have some stiffness in her B LE, R>L. Reports numbness is decreasing. Denies falls.  Pt accompanied by: self  PERTINENT HISTORY: Sjogren's syndrome, SLE, thyromegaly, Vit D deficiency   PAIN:  Are you having pain? No  PRECAUTIONS: Fall  PATIENT GOALS: "to get back active"  OBJECTIVE:  Note: Objective measures were completed at Evaluation unless otherwise noted.  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, history of lupus suggesting autoimmune cause  TREATMENT  THEREX: -modified thomas stretch seated -> supine -hooklying bridges -quadruped thread the needle -quadruped bird dog -supine single knee to chest-> double knee to chest  -supine lumbar rotation stretch -rocking childs pose to cobra  -scifit hills level 2 x5 mins B UE/LE  MANUAL: -spiky ball MFR to R anterior quad/hip flexors -spiky ball MFR to B thoracic paraspinals   PATIENT EDUCATION: Education details: initial HEP, added stretches to HEP  Person educated: Patient Education method: Explanation Education comprehension: verbalized understanding and needs further education  HOME EXERCISE PROGRAM: Access Code: [email or web address] URL: [email or web address] Date: 10/06/2023 Prepared by: Merry Lofty  Exercises - Supine Bridge  - 1 x daily - 7 x weekly - 3 sets - 10 reps - Clamshell  - 1 x daily - 7 x weekly - 3 sets - 10 reps - Bird Dog  - 1 x daily - 7 x weekly - 3 sets - 10 reps - Mini Squat with Counter Support  - 1 x daily - 7 x weekly - 3 sets - 10 reps - Standing March with Counter Support  - 1 x daily - 7 x weekly - 3 sets - 10 reps - Child's Pose Stretch  - 1 x daily - 7 x weekly - 3 sets - 30s hold - Supine Double Knee to Chest  - 1 x daily - 7 x weekly - 3 sets - 30s hold -  Child's Pose with Thread the Needle  - 1 x daily - 7 x weekly - 3 sets - 30s hold - Supine Lumbar Rotation Stretch  - 1 x daily - 7 x weekly - 3 sets - 30s hold  GOALS: Goals reviewed with patient? Yes  SHORT TERM GOALS: Target date: 10/23/23  Pt will be independent with initial HEP for improved functional strength and balance  Baseline: to be provided Goal status: INITIAL  2.  Pt will improve gait speed to >/= .19m/s to demonstrate improved community ambulation  Baseline: .45m/s Goal status: INITIAL  3.  Pt will improve FGA to >/= 19/30 to demonstrate improved balance and reduced fall risk  Baseline: 14/30 Goal status: INITIAL   LONG TERM GOALS: Target date: 11/13/23  Pt will be independent with final HEP for improved functional strength and balance  Baseline: to be provided Goal status: INITIAL  Pt will improve gait speed to >/= .76m/s to demonstrate improved community ambulation  Baseline: .19m/s Goal status: INITIAL  Pt will improve FGA to >/= 24/30 to demonstrate improved balance and reduced fall risk  Baseline: 14/30 Goal status: INITIAL  4. Pt will improve 5x STS to </= 12 sec to demo improved functional LE strength and balance   Baseline: 15.75s   Goal status: INITIAL  5. Pt will improve TUG to </= 12 secs to demonstrated reduced fall risk  Baseline: 15.47s no AD  Goal status: INITIAL    ASSESSMENT:  CLINICAL IMPRESSION: Patient seen for skilled PT session with emphasis on improving general mobility. Patient reporting increased soreness with return of sensation. Benefit from stretching and gentle MFR. Primary areas of concern included R anterior thigh/hip and B thoracic paraspinals. Tolerated stretching well with reported mild relief in her symptoms. Continue POC.   OBJECTIVE IMPAIRMENTS: Abnormal gait, decreased activity tolerance, decreased balance, decreased coordination, decreased endurance, decreased knowledge of condition, decreased knowledge of use of  DME, decreased mobility, difficulty walking, decreased strength, and impaired sensation.   ACTIVITY LIMITATIONS: carrying, lifting, standing, stairs, transfers, locomotion level, and caring for others  PARTICIPATION  LIMITATIONS: meal prep, cleaning, interpersonal relationship, driving, shopping, community activity, and occupation  PERSONAL FACTORS: Age, Social background, Transportation, and 3+ comorbidities: see above  are also affecting patient's functional outcome.   REHAB POTENTIAL: Good  CLINICAL DECISION MAKING: Stable/uncomplicated  EVALUATION COMPLEXITY: Low  PLAN:  PT FREQUENCY: 2x/week  PT DURATION: 6 weeks  PLANNED INTERVENTIONS: 97164- PT Re-evaluation, 97110-Therapeutic exercises, 97530- Therapeutic activity, 97112- Neuromuscular re-education, 97535- Self Care, 24401- Manual therapy, [phone]- Gait training, [phone]- Orthotic Fit/training, [phone]- Canalith repositioning, [phone]- Aquatic Therapy, Patient/Family education, Balance training, Stair training, Dry Needling, Vestibular training, and DME instructions  PLAN FOR NEXT SESSION: HEP, functional strength, balance, power   Westley Foots, PT Westley Foots, PT, DPT, CBIS  10/15/2023, 3:12 PM    For all possible CPT codes, reference the Planned Interventions line above.     Check all conditions that are expected to impact treatment: {Conditions expected to impact treatment:Neurological condition and/or seizures and Social determinants of health   If treatment provided at initial evaluation, no treatment charged due to lack of authorization.

## 2023-10-20 ENCOUNTER — Ambulatory Visit: Payer: Medicaid Other | Admitting: Physical Therapy

## 2023-10-20 DIAGNOSIS — R2689 Other abnormalities of gait and mobility: Secondary | ICD-10-CM

## 2023-10-20 DIAGNOSIS — R293 Abnormal posture: Secondary | ICD-10-CM | POA: Diagnosis not present

## 2023-10-20 DIAGNOSIS — R2681 Unsteadiness on feet: Secondary | ICD-10-CM

## 2023-10-20 DIAGNOSIS — M6281 Muscle weakness (generalized): Secondary | ICD-10-CM

## 2023-10-20 NOTE — Therapy (Signed)
OUTPATIENT PHYSICAL THERAPY NEURO TREATMENT   Patient Name: Stacy Kelley MRN: [id number] DOB:[birth year], 33 y.o., female Today's Date: 10/20/2023   PCP: Ralene Ok, PA-C REFERRING PROVIDER: Mariam Dollar, PA-C  END OF SESSION:  PT End of Session - 10/20/23 1402     Visit Number 4    Number of Visits 13    Date for PT Re-Evaluation 11/13/23    Authorization Type [place] medicaid prepaid    PT Start Time 1401    PT Stop Time 1442    PT Time Calculation (min) 41 min    Activity Tolerance Patient limited by pain    Behavior During Therapy [facility] for tasks assessed/performed             Past Medical History:  Diagnosis Date   Genital herpes    Gonorrhea    Sjogren's syndrome (HCC) 05/12/2022   SLE (systemic lupus erythematosus) (HCC)    Thyromegaly 09/03/2022   Vitamin D deficiency 06/11/2022   Past Surgical History:  Procedure Laterality Date   CESAREAN SECTION     Patient Active Problem List   Diagnosis Date Noted   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   Acute transverse myelitis (HCC) 09/09/2023   Hypokalemia 09/09/2023   Hyperproteinemia 09/09/2023   Thyromegaly 09/03/2022   Iron deficiency anemia 06/11/2022   Vitamin D deficiency 06/11/2022   Systemic lupus erythematosus (HCC) 06/11/2022   Sjogren's syndrome (HCC) 05/12/2022   Hyperpigmentation 07/12/2018   Generalized rash 11/23/2015   NSVD (normal spontaneous vaginal delivery) 05/11/2015    ONSET DATE: 09/18/23 referral  REFERRING DIAG: G37.3 (ICD-10-CM) - Acute transverse myelitis (HCC)   THERAPY DIAG:  Muscle weakness (generalized)  Other abnormalities of gait and mobility  Unsteadiness on feet  Rationale for Evaluation and Treatment: Rehabilitation  SUBJECTIVE:                                                                                                                                                                                             SUBJECTIVE STATEMENT: Patient reports having  a lot of pain today. Thinks it is due to the weather. HEP is "going okay"  Pt accompanied by: self  PERTINENT HISTORY: Sjogren's syndrome, SLE, thyromegaly, Vit D deficiency   PAIN:  Are you having pain? No  PRECAUTIONS: Fall  PATIENT GOALS: "to get back active"  OBJECTIVE:  Note: Objective measures were completed at Evaluation unless otherwise noted.  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  Lateral/fwd/retro monster walks w/red theraband around distal quads, x20' each direction, for improved functional hip strength. Pt reporting cramp in R hip flexor w/movement throughout. No instability noted  Quadruped to half kneel transition for improved quad and hip flexor stretch, x3 per side w/CGA-min A for stability. Pt initially reporting pain in R hip flexor, but did improve w/additional stretch.  Pigeon stretch, 2x1 minute per side, for improved hip mobility. Pt moved slowly but tolerated well. Increased difficulty on R side  Seated march overs using 12# KB, x10 per side, for improved functional core stability and hip strength. No increased difficulty noted on RLE > LLE Seated theraball roll outs for improved spinal mobility and pain modulation, x3 minutes.  Pt rated pain as 7/10 following session   PATIENT EDUCATION: Education details: Continue HEP Person educated: Patient Education method: Explanation Education comprehension: verbalized understanding and needs further education  HOME EXERCISE PROGRAM: Access Code: [email or web address] URL:  [email or web address] Date: 10/06/2023 Prepared by: Merry Lofty  Exercises - Supine Bridge  - 1 x daily - 7 x weekly - 3 sets - 10 reps - Clamshell  - 1 x daily - 7 x weekly - 3 sets - 10 reps - Bird Dog  - 1 x daily - 7 x weekly - 3 sets - 10 reps - Mini Squat with Counter Support  - 1 x daily - 7 x weekly - 3 sets - 10 reps - Standing March with Counter Support  - 1 x daily - 7 x weekly - 3 sets - 10 reps - Child's Pose Stretch  - 1 x daily - 7 x weekly - 3 sets - 30s hold - Supine Double Knee to Chest  - 1 x daily - 7 x weekly - 3 sets - 30s hold - Child's Pose with Thread the Needle  - 1 x daily - 7 x weekly - 3 sets - 30s hold - Supine Lumbar Rotation Stretch  - 1 x daily - 7 x weekly - 3 sets - 30s hold  GOALS: Goals reviewed with patient? Yes  SHORT TERM GOALS: Target date: 10/23/23  Pt will be independent with initial HEP for improved functional strength and balance  Baseline: to be provided Goal status: INITIAL  2.  Pt will improve gait speed to >/= .5m/s to demonstrate improved community ambulation  Baseline: .69m/s Goal status: INITIAL  3.  Pt will improve FGA to >/= 19/30 to demonstrate improved balance and reduced fall risk  Baseline: 14/30 Goal status: INITIAL   LONG TERM GOALS: Target date: 11/13/23  Pt will be independent with final HEP for improved functional strength and balance  Baseline: to be provided Goal status: INITIAL  Pt will improve gait speed to >/= .61m/s to demonstrate improved community ambulation  Baseline: .35m/s Goal status: INITIAL  Pt will improve FGA to >/= 24/30 to demonstrate improved balance and reduced fall risk  Baseline: 14/30 Goal status: INITIAL  4. Pt will improve 5x STS to </= 12 sec to demo improved functional LE strength and balance   Baseline: 15.75s   Goal status: INITIAL  5. Pt will improve TUG to </= 12 secs to demonstrated reduced fall risk  Baseline: 15.47s no AD  Goal status:  INITIAL    ASSESSMENT:  CLINICAL IMPRESSION: Emphasis of skilled PT session on improved functional mobility,  hip strength and pain modulation. Pt reporting significant pain in R hip flexor at beginning of session that did mildly improve following stretching. Pt most challenged by half kneel position due to hip weakness and quad discomfort, but did improve w/practice. Continue POC.   OBJECTIVE IMPAIRMENTS: Abnormal gait, decreased activity tolerance, decreased balance, decreased coordination, decreased endurance, decreased knowledge of condition, decreased knowledge of use of DME, decreased mobility, difficulty walking, decreased strength, and impaired sensation.   ACTIVITY LIMITATIONS: carrying, lifting, standing, stairs, transfers, locomotion level, and caring for others  PARTICIPATION LIMITATIONS: meal prep, cleaning, interpersonal relationship, driving, shopping, community activity, and occupation  PERSONAL FACTORS: Age, Social background, Transportation, and 3+ comorbidities: see above  are also affecting patient's functional outcome.   REHAB POTENTIAL: Good  CLINICAL DECISION MAKING: Stable/uncomplicated  EVALUATION COMPLEXITY: Low  PLAN:  PT FREQUENCY: 2x/week  PT DURATION: 6 weeks  PLANNED INTERVENTIONS: 97164- PT Re-evaluation, 97110-Therapeutic exercises, 97530- Therapeutic activity, [id number]- Neuromuscular re-education, 97535- Self Care, 54098- Manual therapy, [phone]- Gait training, [phone]- Orthotic Fit/training, [phone]- Canalith repositioning, [phone]- Aquatic Therapy, Patient/Family education, Balance training, Stair training, Dry Needling, Vestibular training, and DME instructions  PLAN FOR NEXT SESSION: HEP, functional strength, balance, power, functional mobility (yoga)   Kenwood Rosiak E Myana Schlup, PT, DPT  10/20/2023, 2:42 PM    For all possible CPT codes, reference the Planned Interventions line above.     Check all conditions that are expected to impact treatment:  {Conditions expected to impact treatment:Neurological condition and/or seizures and Social determinants of health   If treatment provided at initial evaluation, no treatment charged due to lack of authorization.

## 2023-10-22 ENCOUNTER — Ambulatory Visit: Payer: Medicaid Other | Admitting: Physical Therapy

## 2023-10-27 ENCOUNTER — Ambulatory Visit: Payer: Medicaid Other

## 2023-10-27 DIAGNOSIS — R2689 Other abnormalities of gait and mobility: Secondary | ICD-10-CM

## 2023-10-27 DIAGNOSIS — R293 Abnormal posture: Secondary | ICD-10-CM | POA: Diagnosis not present

## 2023-10-27 DIAGNOSIS — R2681 Unsteadiness on feet: Secondary | ICD-10-CM

## 2023-10-27 DIAGNOSIS — M6281 Muscle weakness (generalized): Secondary | ICD-10-CM

## 2023-10-27 DIAGNOSIS — R262 Difficulty in walking, not elsewhere classified: Secondary | ICD-10-CM

## 2023-10-27 NOTE — Therapy (Signed)
OUTPATIENT PHYSICAL THERAPY NEURO TREATMENT   Patient Name: SAPPHIRE TYGART MRN: [id number] DOB:May 07, 1991, 33 y.o., female Today's Date: 10/27/2023   PCP: Ralene Ok, PA-C REFERRING PROVIDER: Mariam Dollar, PA-C  END OF SESSION:  PT End of Session - 10/27/23 1400     Visit Number 5    Number of Visits 13    Date for PT Re-Evaluation 11/13/23    Authorization Type [place] medicaid prepaid    PT Start Time 1400    PT Stop Time 1440    PT Time Calculation (min) 40 min    Activity Tolerance Patient limited by pain;Patient tolerated treatment well    Behavior During Therapy [facility] for tasks assessed/performed             Past Medical History:  Diagnosis Date   Genital herpes    Gonorrhea    Sjogren's syndrome (HCC) 05/12/2022   SLE (systemic lupus erythematosus) (HCC)    Thyromegaly 09/03/2022   Vitamin D deficiency 06/11/2022   Past Surgical History:  Procedure Laterality Date   CESAREAN SECTION     Patient Active Problem List   Diagnosis Date Noted   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   Acute transverse myelitis (HCC) 09/09/2023   Hypokalemia 09/09/2023   Hyperproteinemia 09/09/2023   Thyromegaly 09/03/2022   Iron deficiency anemia 06/11/2022   Vitamin D deficiency 06/11/2022   Systemic lupus erythematosus (HCC) 06/11/2022   Sjogren's syndrome (HCC) 05/12/2022   Hyperpigmentation 07/12/2018   Generalized rash 11/23/2015   NSVD (normal spontaneous vaginal delivery) 05/11/2015    ONSET DATE: 09/18/23 referral  REFERRING DIAG: G37.3 (ICD-10-CM) - Acute transverse myelitis (HCC)   THERAPY DIAG:  Unsteadiness on feet  Muscle weakness (generalized)  Other abnormalities of gait and mobility  Abnormal posture  Difficulty in walking, not elsewhere classified  Rationale for Evaluation and Treatment: Rehabilitation  SUBJECTIVE:                                                                                                                                                                                              SUBJECTIVE STATEMENT: Patient reports having a lot of pain today. Thinks it is due to the weather. HEP is "going okay"  Pt accompanied by: self  PERTINENT HISTORY: Sjogren's syndrome, SLE, thyromegaly, Vit D deficiency   PAIN:  Are you having pain? No  PRECAUTIONS: Fall  PATIENT GOALS: "to get back active"  OBJECTIVE:  Note: Objective measures were completed at Evaluation unless otherwise noted.  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, history of lupus suggesting autoimmune cause  TREATMENT   Ther Ex  -scifit hills level 4 x 8 mins B UE/LE  -childs pose -seated hamstring stretch -seated straddle stretch  -seated trunk twist  -sumo squat with 12# kettle bell -deadlift with 12# kettle bell  -modified SLS 5# kettlebell shoulder press  -clamshells red theraband   [facility] PT Assessment - 10/27/23 0001       Standardized Balance Assessment   10 Meter Walk .53m/s      Functional Gait  Assessment   Gait assessed  Yes    Gait Level Surface Walks 20 ft in less than 7 sec but greater than 5.5 sec, uses assistive device, slower speed, mild gait deviations, or deviates 6-10 in outside of the 12 in walkway width.    Change in Gait Speed Able to change speed, demonstrates mild gait deviations, deviates 6-10 in outside of the 12 in walkway width, or no gait deviations, unable to achieve a major change in velocity, or uses a change in velocity, or uses an assistive device.    Gait with Horizontal Head Turns Performs head turns smoothly with no change in gait. Deviates no more than 6 in outside 12 in walkway width    Gait with Vertical Head Turns Performs head turns with no change in gait. Deviates no more than 6 in outside 12 in walkway width.     Gait and Pivot Turn Pivot turns safely in greater than 3 sec and stops with no loss of balance, or pivot turns safely within 3 sec and stops with mild imbalance, requires small steps to catch balance.    Step Over Obstacle Is able to step over one shoe box (4.5 in total height) without changing gait speed. No evidence of imbalance.    Gait with Narrow Base of Support Ambulates 4-7 steps.    Gait with Eyes Closed Walks 20 ft, uses assistive device, slower speed, mild gait deviations, deviates 6-10 in outside 12 in walkway width. Ambulates 20 ft in less than 9 sec but greater than 7 sec.    Ambulating Backwards Walks 20 ft, uses assistive device, slower speed, mild gait deviations, deviates 6-10 in outside 12 in walkway width.    Steps Alternating feet, must use rail.    Total Score 21             PATIENT EDUCATION: Education details: Continue HEP Person educated: Patient Education method: Explanation Education comprehension: verbalized understanding and needs further education  HOME EXERCISE PROGRAM: Access Code: [email or web address] URL: [email or web address] Date: 10/06/2023 Prepared by: Merry Lofty  Exercises - Supine Bridge  - 1 x daily - 7 x weekly - 3 sets - 10 reps - Clamshell  - 1 x daily - 7 x weekly - 3 sets - 10 reps - Bird Dog  - 1 x daily - 7 x weekly - 3 sets - 10 reps - Mini Squat with Counter Support  - 1 x daily - 7 x weekly - 3 sets - 10 reps - Standing March with Counter Support  - 1 x daily - 7 x weekly - 3 sets - 10 reps - Child's Pose Stretch  - 1 x daily - 7 x weekly - 3 sets - 30s hold - Supine Double Knee to Chest  - 1 x daily - 7 x weekly - 3 sets - 30s hold - Child's Pose with Thread the Needle  - 1 x daily - 7 x weekly - 3 sets - 30s hold - Supine Lumbar Rotation Stretch  -  1 x daily - 7 x weekly - 3 sets - 30s hold  GOALS: Goals reviewed with patient? Yes  SHORT TERM GOALS: Target date: 10/23/23  Pt will be independent with initial HEP for  improved functional strength and balance  Baseline: to be provided; provided Goal status: MET  2.  Pt will improve gait speed to >/= .12m/s to demonstrate improved community ambulation  Baseline: .37m/s; .57m/s Goal status: IN PROGRESS  3.  Pt will improve FGA to >/= 19/30 to demonstrate improved balance and reduced fall risk  Baseline: 14/30; 21/30 Goal status: MET   LONG TERM GOALS: Target date: 11/13/23  Pt will be independent with final HEP for improved functional strength and balance  Baseline: to be provided Goal status: INITIAL  Pt will improve gait speed to >/= .62m/s to demonstrate improved community ambulation  Baseline: .53m/s Goal status: INITIAL  Pt will improve FGA to >/= 24/30 to demonstrate improved balance and reduced fall risk  Baseline: 14/30 Goal status: INITIAL  4. Pt will improve 5x STS to </= 12 sec to demo improved functional LE strength and balance   Baseline: 15.75s   Goal status: INITIAL  5. Pt will improve TUG to </= 12 secs to demonstrated reduced fall risk  Baseline: 15.47s no AD  Goal status: INITIAL    ASSESSMENT:  CLINICAL IMPRESSION: Patient seen for skilled PT session with emphasis on functional strengthening. She continues to report pain and stiffness in her core, but presents with adequate range of motion. Strength and speed improving, limited by shoe choice. Pain also seems to continue to be a main limiting factor to patients progress through therapy. Patient demonstrates increased fall risk as noted by score of 21/30 on  Functional Gait Assessment.   <22/30 = predictive of falls, <20/30 = fall in 6 months, <18/30 = predictive of falls in PD MCID: 5 points stroke population, 4 points geriatric population (ANPTA Core Set of Outcome Measures for Adults with Neurologic Conditions, 2018). 10 Meter Walk Test: Patient instructed to walk 10 meters (32.8 ft) as quickly and as safely as possible at their normal speed x2 and at a fast speed x2.  Time measured from 2 meter mark to 8 meter mark to accommodate ramp-up and ramp-down.  Normal speed: .80m/s Cut off scores: <0.4 m/s = household Ambulator, 0.4-0.8 m/s = limited community Ambulator, >0.8 m/s = community Ambulator, >1.2 m/s = crossing a street, <1.0 = increased fall risk MCID 0.05 m/s (small), 0.13 m/s (moderate), 0.06 m/s (significant)  (ANPTA Core Set of Outcome Measures for Adults with Neurologic Conditions, 2018). Continue POC.   OBJECTIVE IMPAIRMENTS: Abnormal gait, decreased activity tolerance, decreased balance, decreased coordination, decreased endurance, decreased knowledge of condition, decreased knowledge of use of DME, decreased mobility, difficulty walking, decreased strength, and impaired sensation.   ACTIVITY LIMITATIONS: carrying, lifting, standing, stairs, transfers, locomotion level, and caring for others  PARTICIPATION LIMITATIONS: meal prep, cleaning, interpersonal relationship, driving, shopping, community activity, and occupation  PERSONAL FACTORS: Age, Social background, Transportation, and 3+ comorbidities: see above  are also affecting patient's functional outcome.   REHAB POTENTIAL: Good  CLINICAL DECISION MAKING: Stable/uncomplicated  EVALUATION COMPLEXITY: Low  PLAN:  PT FREQUENCY: 2x/week  PT DURATION: 6 weeks  PLANNED INTERVENTIONS: 97164- PT Re-evaluation, 97110-Therapeutic exercises, 97530- Therapeutic activity, [id number]- Neuromuscular re-education, 97535- Self Care, [id number]- Manual therapy, [id number]- Gait training, [phone]- Orthotic Fit/training, [phone]- Canalith repositioning, [phone]- Aquatic Therapy, Patient/Family education, Balance training, Stair training, Dry Needling, Vestibular training, and DME instructions  PLAN FOR  NEXT SESSION: HEP, functional strength, balance, power, functional mobility (yoga)   Westley Foots, PT Westley Foots, PT, DPT, CBIS  10/27/2023, 2:53 PM    For all possible CPT codes, reference the Planned  Interventions line above.     Check all conditions that are expected to impact treatment: {Conditions expected to impact treatment:Neurological condition and/or seizures and Social determinants of health   If treatment provided at initial evaluation, no treatment charged due to lack of authorization.

## 2023-10-29 ENCOUNTER — Ambulatory Visit: Payer: Medicaid Other | Admitting: Physical Therapy

## 2023-10-29 DIAGNOSIS — R2689 Other abnormalities of gait and mobility: Secondary | ICD-10-CM

## 2023-10-29 DIAGNOSIS — R293 Abnormal posture: Secondary | ICD-10-CM | POA: Diagnosis not present

## 2023-10-29 DIAGNOSIS — R2681 Unsteadiness on feet: Secondary | ICD-10-CM

## 2023-10-29 DIAGNOSIS — M6281 Muscle weakness (generalized): Secondary | ICD-10-CM

## 2023-10-29 NOTE — Therapy (Signed)
OUTPATIENT PHYSICAL THERAPY NEURO TREATMENT   Patient Name: Stacy Kelley MRN: [id number] DOB:12/21/1990, 33 y.o., female Today's Date: 10/29/2023   PCP: Ralene Ok, PA-C REFERRING PROVIDER: Mariam Dollar, PA-C  END OF SESSION:  PT End of Session - 10/29/23 1404     Visit Number 6    Number of Visits 13    Date for PT Re-Evaluation 11/13/23    Authorization Type [place] medicaid prepaid    PT Start Time 1402    PT Stop Time 1442    PT Time Calculation (min) 40 min    Activity Tolerance Patient tolerated treatment well    Behavior During Therapy [facility] for tasks assessed/performed              Past Medical History:  Diagnosis Date   Genital herpes    Gonorrhea    Sjogren's syndrome (HCC) 05/12/2022   SLE (systemic lupus erythematosus) (HCC)    Thyromegaly 09/03/2022   Vitamin D deficiency 06/11/2022   Past Surgical History:  Procedure Laterality Date   CESAREAN SECTION     Patient Active Problem List   Diagnosis Date Noted   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   Acute transverse myelitis (HCC) 09/09/2023   Hypokalemia 09/09/2023   Hyperproteinemia 09/09/2023   Thyromegaly 09/03/2022   Iron deficiency anemia 06/11/2022   Vitamin D deficiency 06/11/2022   Systemic lupus erythematosus (HCC) 06/11/2022   Sjogren's syndrome (HCC) 05/12/2022   Hyperpigmentation 07/12/2018   Generalized rash 11/23/2015   NSVD (normal spontaneous vaginal delivery) 05/11/2015    ONSET DATE: 09/18/23 referral  REFERRING DIAG: G37.3 (ICD-10-CM) - Acute transverse myelitis (HCC)   THERAPY DIAG:  Unsteadiness on feet  Muscle weakness (generalized)  Other abnormalities of gait and mobility  Rationale for Evaluation and Treatment: Rehabilitation  SUBJECTIVE:                                                                                                                                                                                             SUBJECTIVE STATEMENT: Pt reports  feeling "pretty good" today. Pain is better. HEP is "pretty good"   Pt accompanied by: self  PERTINENT HISTORY: Sjogren's syndrome, SLE, thyromegaly, Vit D deficiency   PAIN:  Are you having pain? Yes: NPRS scale: 4/10 Pain location: BLEs and hips Pain description: Achy  PRECAUTIONS: Fall  PATIENT GOALS: "to get back active"  OBJECTIVE:  Note: Objective measures were completed at Evaluation unless otherwise noted.  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 RPE of 4/10 following activity  Alt fwd and lateral eccentric heel taps from 6" step w/BUE support, x12 reps per side, for improved functional hip and quad strength. Min Cues to avoid valgus position of knees bilaterally throughout. No pain reported with activity.  Fwd step up w/contralateral march to 6" box while holding 15# KB on marching leg side, x10 reps per side for improved functional hip strength and single leg stability. Single UE support required throughout. RPE of 4-5/10 following activity. At ballet bar, alt curtsy lunges for improved hip and quad strength, x10 reps per side. Max multimodal cues required for proper technique. Increased difficulty performing L knee flexion when LLE posterior.  Pt reported pain in low back following activity and requested to work on spinal mobility for remainder of session.  Supine iron crosses, x8 per side  Supine LTRs, x10 per side.  Quadruped cat cows, x10 reps.    PATIENT EDUCATION: Education details: Continue HEP Person educated: Patient Education method:  Explanation Education comprehension: verbalized understanding and needs further education  HOME EXERCISE PROGRAM: Access Code: [email or web address] URL: [email or web address] Date: 10/06/2023 Prepared by: Merry Lofty  Exercises - Supine Bridge  - 1 x daily - 7 x weekly - 3 sets - 10 reps - Clamshell  - 1 x daily - 7 x weekly - 3 sets - 10 reps - Bird Dog  - 1 x daily - 7 x weekly - 3 sets - 10 reps - Mini Squat with Counter Support  - 1 x daily - 7 x weekly - 3 sets - 10 reps - Standing March with Counter Support  - 1 x daily - 7 x weekly - 3 sets - 10 reps - Child's Pose Stretch  - 1 x daily - 7 x weekly - 3 sets - 30s hold - Supine Double Knee to Chest  - 1 x daily - 7 x weekly - 3 sets - 30s hold - Child's Pose with Thread the Needle  - 1 x daily - 7 x weekly - 3 sets - 30s hold - Supine Lumbar Rotation Stretch  - 1 x daily - 7 x weekly - 3 sets - 30s hold  GOALS: Goals reviewed with patient? Yes  SHORT TERM GOALS: Target date: 10/23/23  Pt will be independent with initial HEP for improved functional strength and balance  Baseline: to be provided; provided Goal status: MET  2.  Pt will improve gait speed to >/= .66m/s to demonstrate improved community ambulation  Baseline: .49m/s; .48m/s Goal status: IN PROGRESS  3.  Pt will improve FGA to >/= 19/30 to demonstrate improved balance and reduced fall risk  Baseline: 14/30; 21/30 Goal status: MET   LONG TERM GOALS: Target date: 11/13/23  Pt will be independent with final HEP for improved functional strength and balance  Baseline: to be provided Goal status: INITIAL  Pt will improve gait speed to >/= .72m/s to demonstrate improved community ambulation  Baseline: .46m/s Goal status: INITIAL  Pt will improve FGA to >/= 24/30 to demonstrate improved balance and reduced fall risk  Baseline: 14/30 Goal status: INITIAL  4. Pt will improve 5x STS to </= 12 sec to demo improved functional LE strength and balance    Baseline: 15.75s   Goal status: INITIAL  5. Pt will improve TUG to </= 12 secs to  demonstrated reduced fall risk  Baseline: 15.47s no AD  Goal status: INITIAL    ASSESSMENT:  CLINICAL IMPRESSION: Emphasis of skilled PT session on functional BLE strength, spinal mobility and single leg stability. Pt w/decreased pain today but continues to exhibit self-limiting behavior w/mobility. Pt w/decreased knee flexion of LLE w/activities but no LOB or instability noted. Continue POC.   OBJECTIVE IMPAIRMENTS: Abnormal gait, decreased activity tolerance, decreased balance, decreased coordination, decreased endurance, decreased knowledge of condition, decreased knowledge of use of DME, decreased mobility, difficulty walking, decreased strength, and impaired sensation.   ACTIVITY LIMITATIONS: carrying, lifting, standing, stairs, transfers, locomotion level, and caring for others  PARTICIPATION LIMITATIONS: meal prep, cleaning, interpersonal relationship, driving, shopping, community activity, and occupation  PERSONAL FACTORS: Age, Social background, Transportation, and 3+ comorbidities: see above  are also affecting patient's functional outcome.   REHAB POTENTIAL: Good  CLINICAL DECISION MAKING: Stable/uncomplicated  EVALUATION COMPLEXITY: Low  PLAN:  PT FREQUENCY: 2x/week  PT DURATION: 6 weeks  PLANNED INTERVENTIONS: 97164- PT Re-evaluation, 97110-Therapeutic exercises, 97530- Therapeutic activity, [id number]- Neuromuscular re-education, 97535- Self Care, 72536- Manual therapy, [phone]- Gait training, [phone]- Orthotic Fit/training, [phone]- Canalith repositioning, [phone]- Aquatic Therapy, Patient/Family education, Balance training, Stair training, Dry Needling, Vestibular training, and DME instructions  PLAN FOR NEXT SESSION: HEP, functional strength, balance, power, functional mobility (yoga)   Aamari West E Bailei Buist, PT, DPT 10/29/2023, 2:44 PM    For all possible CPT codes, reference the Planned  Interventions line above.     Check all conditions that are expected to impact treatment: {Conditions expected to impact treatment:Neurological condition and/or seizures and Social determinants of health   If treatment provided at initial evaluation, no treatment charged due to lack of authorization.

## 2023-11-03 ENCOUNTER — Ambulatory Visit: Payer: Medicaid Other | Attending: Physician Assistant

## 2023-11-03 DIAGNOSIS — R2689 Other abnormalities of gait and mobility: Secondary | ICD-10-CM | POA: Diagnosis present

## 2023-11-03 DIAGNOSIS — M6281 Muscle weakness (generalized): Secondary | ICD-10-CM | POA: Diagnosis present

## 2023-11-03 DIAGNOSIS — R2681 Unsteadiness on feet: Secondary | ICD-10-CM | POA: Insufficient documentation

## 2023-11-03 DIAGNOSIS — R293 Abnormal posture: Secondary | ICD-10-CM | POA: Diagnosis present

## 2023-11-03 DIAGNOSIS — R262 Difficulty in walking, not elsewhere classified: Secondary | ICD-10-CM | POA: Diagnosis present

## 2023-11-03 NOTE — Therapy (Signed)
 OUTPATIENT PHYSICAL THERAPY NEURO TREATMENT   Patient Name: Stacy Kelley MRN: [id number] DOB:1991-07-25, 33 y.o., female Today's Date: 11/03/2023   PCP: Lauraine Burkes, PA-C REFERRING PROVIDER: Toribio Pitch, PA-C  END OF SESSION:  PT End of Session - 11/03/23 1348     Visit Number 7    Number of Visits 13    Date for PT Re-Evaluation 11/13/23    Authorization Type [place] medicaid prepaid    PT Start Time 1356    PT Stop Time 1440    PT Time Calculation (min) 44 min    Activity Tolerance Patient tolerated treatment well    Behavior During Therapy WFL for tasks assessed/performed              Past Medical History:  Diagnosis Date   Genital herpes    Gonorrhea    Sjogren's syndrome (HCC) 05/12/2022   SLE (systemic lupus erythematosus) (HCC)    Thyromegaly 09/03/2022   Vitamin D  deficiency 06/11/2022   Past Surgical History:  Procedure Laterality Date   CESAREAN SECTION     Patient Active Problem List   Diagnosis Date Noted   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   Acute transverse myelitis (HCC) 09/09/2023   Hypokalemia 09/09/2023   Hyperproteinemia 09/09/2023   Thyromegaly 09/03/2022   Iron  deficiency anemia 06/11/2022   Vitamin D  deficiency 06/11/2022   Systemic lupus erythematosus (HCC) 06/11/2022   Sjogren's syndrome (HCC) 05/12/2022   Hyperpigmentation 07/12/2018   Generalized rash 11/23/2015   NSVD (normal spontaneous vaginal delivery) 05/11/2015    ONSET DATE: 09/18/23 referral  REFERRING DIAG: G37.3 (ICD-10-CM) - Acute transverse myelitis (HCC)   THERAPY DIAG:  Unsteadiness on feet  Muscle weakness (generalized)  Other abnormalities of gait and mobility  Difficulty in walking, not elsewhere classified  Abnormal posture  Rationale for Evaluation and Treatment: Rehabilitation  SUBJECTIVE:                                                                                                                                                                                              SUBJECTIVE STATEMENT: Patient reports doing well. Continues to have pain in midsection. Denies falls. Feels as though she's continuing to improve.   Pt accompanied by: self  PERTINENT HISTORY: Sjogren's syndrome, SLE, thyromegaly, Vit D deficiency   PAIN:  Are you having pain? Yes: NPRS scale: 4-5/10 Pain location: BLEs and hips Pain description: Achy  PRECAUTIONS: Fall  PATIENT GOALS: to get back active  OBJECTIVE:  Note: Objective measures were completed at Evaluation unless otherwise noted.  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, history  of lupus suggesting autoimmune cause                                                                                                                         TREATMENT  NMR: -scifit hills level 2 x10 mins B UE/LE for neural priming -long sit on dynadisc blaze pod random tapping for improved trunk control   THEREX -supine SKTC -supine thoracic twist -quadruped bird dog  -quadruped thread the needle -childs pose    PATIENT EDUCATION: Education details: Continue HEP, progress in therapy thus far Person educated: Patient Education method: Explanation Education comprehension: verbalized understanding and needs further education  HOME EXERCISE PROGRAM: Access Code: [email or web address] URL: [email or web address] Date: 10/06/2023 Prepared by: Delon Pop  Exercises - Supine Bridge  - 1 x daily - 7 x weekly - 3 sets - 10 reps - Clamshell  - 1 x daily - 7 x weekly - 3 sets - 10 reps - Bird Dog  - 1 x daily - 7 x weekly - 3 sets - 10 reps - Mini Squat with Counter Support  - 1 x daily - 7 x weekly - 3 sets - 10 reps - Standing March with Counter Support  - 1 x daily - 7 x weekly - 3 sets - 10 reps - Child's Pose Stretch  - 1 x daily - 7 x weekly - 3 sets - 30s hold - Supine Double Knee to Chest  - 1 x daily - 7 x weekly  - 3 sets - 30s hold - Child's Pose with Thread the Needle  - 1 x daily - 7 x weekly - 3 sets - 30s hold - Supine Lumbar Rotation Stretch  - 1 x daily - 7 x weekly - 3 sets - 30s hold  GOALS: Goals reviewed with patient? Yes  SHORT TERM GOALS: Target date: 10/23/23  Pt will be independent with initial HEP for improved functional strength and balance  Baseline: to be provided; provided Goal status: MET  2.  Pt will improve gait speed to >/= .67m/s to demonstrate improved community ambulation  Baseline: .33m/s; .36m/s Goal status: IN PROGRESS  3.  Pt will improve FGA to >/= 19/30 to demonstrate improved balance and reduced fall risk  Baseline: 14/30; 21/30 Goal status: MET   LONG TERM GOALS: Target date: 11/13/23  Pt will be independent with final HEP for improved functional strength and balance  Baseline: to be provided Goal status: INITIAL  Pt will improve gait speed to >/= .63m/s to demonstrate improved community ambulation  Baseline: .73m/s Goal status: INITIAL  Pt will improve FGA to >/= 24/30 to demonstrate improved balance and reduced fall risk  Baseline: 14/30 Goal status: INITIAL  4. Pt will improve 5x STS to </= 12 sec to demo improved functional LE strength and balance   Baseline: 15.75s   Goal status: INITIAL  5. Pt will improve TUG to </= 12 secs to demonstrated reduced fall risk  Baseline: 15.47s no AD  Goal  status: INITIAL    ASSESSMENT:  CLINICAL IMPRESSION: Patient seen for skilled PT session with emphasis on continued mobility and core strengthening. Given patients reports of tightness in her midsection, she does demonstrate impaired righting reactions with degrees of freedom partially eliminated. Continues to benefit from stretching and general mobility to return to prior level of function. Continue POC.   OBJECTIVE IMPAIRMENTS: Abnormal gait, decreased activity tolerance, decreased balance, decreased coordination, decreased endurance, decreased  knowledge of condition, decreased knowledge of use of DME, decreased mobility, difficulty walking, decreased strength, and impaired sensation.   ACTIVITY LIMITATIONS: carrying, lifting, standing, stairs, transfers, locomotion level, and caring for others  PARTICIPATION LIMITATIONS: meal prep, cleaning, interpersonal relationship, driving, shopping, community activity, and occupation  PERSONAL FACTORS: Age, Social background, Transportation, and 3+ comorbidities: see above  are also affecting patient's functional outcome.   REHAB POTENTIAL: Good  CLINICAL DECISION MAKING: Stable/uncomplicated  EVALUATION COMPLEXITY: Low  PLAN:  PT FREQUENCY: 2x/week  PT DURATION: 6 weeks  PLANNED INTERVENTIONS: 97164- PT Re-evaluation, 97110-Therapeutic exercises, 97530- Therapeutic activity, 97112- Neuromuscular re-education, 97535- Self Care, 02859- Manual therapy, [phone]- Gait training, [phone]- Orthotic Fit/training, [phone]- Canalith repositioning, [phone]- Aquatic Therapy, Patient/Family education, Balance training, Stair training, Dry Needling, Vestibular training, and DME instructions  PLAN FOR NEXT SESSION: HEP, functional strength, balance, power, functional mobility (yoga)   Delon DELENA Pop, PT Delon DELENA Pop, PT, DPT, CBIS  11/03/2023, 2:42 PM    For all possible CPT codes, reference the Planned Interventions line above.     Check all conditions that are expected to impact treatment: {Conditions expected to impact treatment:Neurological condition and/or seizures and Social determinants of health   If treatment provided at initial evaluation, no treatment charged due to lack of authorization.

## 2023-11-05 ENCOUNTER — Ambulatory Visit: Payer: Medicaid Other

## 2023-11-10 ENCOUNTER — Ambulatory Visit: Payer: Medicaid Other

## 2023-11-12 ENCOUNTER — Ambulatory Visit: Payer: Medicaid Other | Admitting: Physical Therapy

## 2023-11-12 DIAGNOSIS — R2681 Unsteadiness on feet: Secondary | ICD-10-CM | POA: Diagnosis not present

## 2023-11-12 DIAGNOSIS — R2689 Other abnormalities of gait and mobility: Secondary | ICD-10-CM

## 2023-11-12 DIAGNOSIS — M6281 Muscle weakness (generalized): Secondary | ICD-10-CM

## 2023-11-12 NOTE — Therapy (Signed)
OUTPATIENT PHYSICAL THERAPY NEURO TREATMENT- DISCHARGE SUMMARY    Patient Name: Stacy Kelley MRN: [id number] DOB:04-17-1991, 33 y.o., female Today's Date: 11/12/2023   PCP: Ralene Ok, PA-C REFERRING PROVIDER: Mariam Dollar, PA-C  PHYSICAL THERAPY DISCHARGE SUMMARY  Visits from Start of Care: 8  Current functional level related to goals / functional outcomes: Mod I w/all ADLs w/o AD   Remaining deficits: Decreased activity tolerance, medium fall risk    Education / Equipment: HEP   Patient agrees to discharge. Patient goals were met. Patient is being discharged due to meeting the stated rehab goals.   END OF SESSION:  PT End of Session - 11/12/23 1407     Visit Number 8    Number of Visits 13    Date for PT Re-Evaluation 11/13/23    Authorization Type [place] medicaid prepaid    PT Start Time 1405   Pt arrived late   PT Stop Time 1431   DC   PT Time Calculation (min) 26 min    Activity Tolerance Patient tolerated treatment well    Behavior During Therapy WFL for tasks assessed/performed               Past Medical History:  Diagnosis Date   Genital herpes    Gonorrhea    Sjogren's syndrome (HCC) 05/12/2022   SLE (systemic lupus erythematosus) (HCC)    Thyromegaly 09/03/2022   Vitamin D deficiency 06/11/2022   Past Surgical History:  Procedure Laterality Date   CESAREAN SECTION     Patient Active Problem List   Diagnosis Date Noted   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   Acute transverse myelitis (HCC) 09/09/2023   Hypokalemia 09/09/2023   Hyperproteinemia 09/09/2023   Thyromegaly 09/03/2022   Iron deficiency anemia 06/11/2022   Vitamin D deficiency 06/11/2022   Systemic lupus erythematosus (HCC) 06/11/2022   Sjogren's syndrome (HCC) 05/12/2022   Hyperpigmentation 07/12/2018   Generalized rash 11/23/2015   NSVD (normal spontaneous vaginal delivery) 05/11/2015    ONSET DATE: 09/18/23 referral  REFERRING DIAG: G37.3 (ICD-10-CM) - Acute  transverse myelitis (HCC)   THERAPY DIAG:  Unsteadiness on feet  Muscle weakness (generalized)  Other abnormalities of gait and mobility  Rationale for Evaluation and Treatment: Rehabilitation  SUBJECTIVE:                                                                                                                                                                                             SUBJECTIVE STATEMENT: Patient reports doing well. Took a Tylenol last night so is feeling better today. No falls.   Pt accompanied by: self  PERTINENT HISTORY: Sjogren's  syndrome, SLE, thyromegaly, Vit D deficiency   PAIN:  Are you having pain? No  PRECAUTIONS: Fall  PATIENT GOALS: "to get back active"  OBJECTIVE:  Note: Objective measures were completed at Evaluation unless otherwise noted.  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, history of lupus suggesting autoimmune cause                                                                                                                         TREATMENT:   Physical Performance   [facility] PT Assessment - 11/12/23 1416       Transfers   Five time sit to stand comments  11.84s   No UE support     Standardized Balance Assessment   10 Meter Walk 0.81 m/s   12.31s no AD     Timed Up and Go Test   Normal TUG (seconds) 9.57   No AD     Functional Gait  Assessment   Gait assessed  Yes    Gait Level Surface Walks 20 ft, slow speed, abnormal gait pattern, evidence for imbalance or deviates 10-15 in outside of the 12 in walkway width. Requires more than 7 sec to ambulate 20 ft.   8.53s   Change in Gait Speed Able to change speed, demonstrates mild gait deviations, deviates 6-10 in outside of the 12 in walkway width, or no gait deviations, unable to achieve a major change in velocity, or uses a change in velocity, or uses an assistive device.    Gait with Horizontal  Head Turns Performs head turns smoothly with no change in gait. Deviates no more than 6 in outside 12 in walkway width    Gait with Vertical Head Turns Performs head turns with no change in gait. Deviates no more than 6 in outside 12 in walkway width.    Gait and Pivot Turn Pivot turns safely within 3 sec and stops quickly with no loss of balance.    Step Over Obstacle Is able to step over 2 stacked shoe boxes taped together (9 in total height) without changing gait speed. No evidence of imbalance.    Gait with Narrow Base of Support Is able to ambulate for 10 steps heel to toe with no staggering.    Gait with Eyes Closed Walks 20 ft, slow speed, abnormal gait pattern, evidence for imbalance, deviates 10-15 in outside 12 in walkway width. Requires more than 9 sec to ambulate 20 ft.   10.41s   Ambulating Backwards Walks 20 ft, uses assistive device, slower speed, mild gait deviations, deviates 6-10 in outside 12 in walkway width.    Steps Alternating feet, no rail.    Total Score 24    FGA comment: Medium fall risk            Discussed results of outcome measures and significant improvement in balance, gait speed and strength. Educated pt on how to obtain new PT referral in future if  mobility needs change, pt verbalized understanding.    PATIENT EDUCATION: Education details: Gaol results, continue HEP Person educated: Patient Education method: Explanation Education comprehension: verbalized understanding  HOME EXERCISE PROGRAM: Access Code: [email or web address] URL: [email or web address] Date: 10/06/2023 Prepared by: Merry Lofty  Exercises - Supine Bridge  - 1 x daily - 7 x weekly - 3 sets - 10 reps - Clamshell  - 1 x daily - 7 x weekly - 3 sets - 10 reps - Bird Dog  - 1 x daily - 7 x weekly - 3 sets - 10 reps - Mini Squat with Counter Support  - 1 x daily - 7 x weekly - 3 sets - 10 reps - Standing March with Counter Support  - 1 x daily - 7 x weekly - 3 sets - 10 reps -  Child's Pose Stretch  - 1 x daily - 7 x weekly - 3 sets - 30s hold - Supine Double Knee to Chest  - 1 x daily - 7 x weekly - 3 sets - 30s hold - Child's Pose with Thread the Needle  - 1 x daily - 7 x weekly - 3 sets - 30s hold - Supine Lumbar Rotation Stretch  - 1 x daily - 7 x weekly - 3 sets - 30s hold  GOALS: Goals reviewed with patient? Yes  SHORT TERM GOALS: Target date: 10/23/23  Pt will be independent with initial HEP for improved functional strength and balance  Baseline: to be provided; provided Goal status: MET  2.  Pt will improve gait speed to >/= .22m/s to demonstrate improved community ambulation  Baseline: .39m/s; .90m/s Goal status: IN PROGRESS  3.  Pt will improve FGA to >/= 19/30 to demonstrate improved balance and reduced fall risk  Baseline: 14/30; 21/30 Goal status: MET   LONG TERM GOALS: Target date: 11/13/23  Pt will be independent with final HEP for improved functional strength and balance  Baseline: to be provided Goal status: MET   Pt will improve gait speed to >/= .38m/s to demonstrate improved community ambulation  Baseline: .70m/s; 0.81 m/s  Goal status: MET   Pt will improve FGA to >/= 24/30 to demonstrate improved balance and reduced fall risk  Baseline: 14/30; 24/30  Goal status: MET  4. Pt will improve 5x STS to </= 12 sec to demo improved functional LE strength and balance   Baseline: 15.75s; 11.84s no UE support   Goal status: MET  5. Pt will improve TUG to </= 12 secs to demonstrated reduced fall risk  Baseline: 15.47s no AD; 9.57s no AD  Goal status: MET    ASSESSMENT:  CLINICAL IMPRESSION: Emphasis of skilled PT session on LTG assessment and DC from PT. Pt has met 5 of 5 LTGs, w/significant improvements in gait speed, 5x STS and TUG w/o AD, indicative of improved functional strength and stability. Pt also improved her score on FGA to 24/30, indicative of medium fall risk but greatly improved from her initial score of 14/30. Pt  most limited by reduced gait speed on FGA rather than instability. Pt in agreement to DC this date as she is at mod I level and is satisfied with current functional level.   OBJECTIVE IMPAIRMENTS: Abnormal gait, decreased activity tolerance, decreased balance, decreased coordination, decreased endurance, decreased knowledge of condition, decreased knowledge of use of DME, decreased mobility, difficulty walking, decreased strength, and impaired sensation.   ACTIVITY LIMITATIONS: carrying, lifting, standing, stairs, transfers, locomotion level, and caring for others  PARTICIPATION LIMITATIONS: meal  prep, cleaning, interpersonal relationship, driving, shopping, community activity, and occupation  PERSONAL FACTORS: Age, Social background, Transportation, and 3+ comorbidities: see above  are also affecting patient's functional outcome.   REHAB POTENTIAL: Good  CLINICAL DECISION MAKING: Stable/uncomplicated  EVALUATION COMPLEXITY: Low  PLAN:  PT FREQUENCY: 2x/week  PT DURATION: 6 weeks  PLANNED INTERVENTIONS: 97164- PT Re-evaluation, 97110-Therapeutic exercises, 97530- Therapeutic activity, 97112- Neuromuscular re-education, 97535- Self Care, 16109- Manual therapy, [phone]- Gait training, [phone]- Orthotic Fit/training, [phone]- Canalith repositioning, [phone]- Aquatic Therapy, Patient/Family education, Balance training, Stair training, Dry Needling, Vestibular training, and DME instructions  Jill Alexanders Etosha Wetherell, PT, DPT  11/12/2023, 2:31 PM    For all possible CPT codes, reference the Planned Interventions line above.     Check all conditions that are expected to impact treatment: {Conditions expected to impact treatment:Neurological condition and/or seizures and Social determinants of health   If treatment provided at initial evaluation, no treatment charged due to lack of authorization.

## 2023-11-16 ENCOUNTER — Encounter
Payer: Medicaid Other | Attending: Physical Medicine and Rehabilitation | Admitting: Physical Medicine and Rehabilitation

## 2023-11-16 ENCOUNTER — Encounter: Payer: Self-pay | Admitting: Physical Medicine and Rehabilitation

## 2023-11-16 VITALS — BP 116/78 | HR 96 | Ht 62.0 in | Wt 113.0 lb

## 2023-11-16 DIAGNOSIS — M328 Other forms of systemic lupus erythematosus: Secondary | ICD-10-CM | POA: Diagnosis present

## 2023-11-16 DIAGNOSIS — G373 Acute transverse myelitis in demyelinating disease of central nervous system: Secondary | ICD-10-CM | POA: Insufficient documentation

## 2023-11-16 NOTE — Patient Instructions (Signed)
Here for f/u on transverse myelitis - T7 ASIA D- incomplete paraplegia with no spasticity or neurogenic bowel or bladder anymore.    MRI shows improvement and went over with pt- now T4-5 and T6-7- but was T1-T9- is now just spotty- in 2 places.   2. Taking your lupus meds is essential- - if you don't take meds, could increase risk dramatically of it coming back, and getting worse if you don't take meds.    3. Educated that nerves for bowel and bladder are same as for intimacy- so suggest working on by self, before practice.    4.  L leg is a little weaker than R leg- but went over with her- likely to get it back- based on track of your improvement already, it's only been 2 months and you are  90% back, so think will get back to 95-100% better.    5. No neurogenic bowel and bladder   6. Mild at level SCI pain- doesn't want additional meds. Call me if you do, if gets worse.    7.   Do Home exercise program every day- continue standing on heels and toes as well- do 10-15 minutes/day. Got worksheets!  8. F/U in 3 months- single appt- transverse myelitis.

## 2023-11-16 NOTE — Progress Notes (Signed)
Subjective:    Patient ID: Stacy Kelley, female    DOB: Oct 17, 1990, 33 y.o.   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    Doing OK- walking without an Assistive device- getting strength back- doing HEP  A little tightness in abdomen and numbness- feels much better- easing down.  Can walk a little better Sleeping not great still.    Having some muscle spasms in back- real sharp pain- but not "bad"- can feels when gets up to walk- and eventually calms down-  lasts a few minutes.   Is taking tylenol- last time took it 3-4 days ago- tries to "deal with it".    Rating pain ~ 3-4/10- not too much.  Most of that pain in back- and tightness in abd Still has numbness in legs, but no pain.  Numbness is better than it was in hospital.    Can go to grocery store by self now.  Is driving.  Done with Therapy- outpt- last Thursday, Got a shirt for graduation.   No falls   Lupus was thought to be the cause for transverse myelitis-  MRI 10/09/23- is improved since December- has from T4/5 and T6/7- not contiguous.    Bowel and bladder back to normal- and can feel that needs to go now.   Hasn't gone for intimacy yet- b  Pain Inventory Average Pain 4 Pain Right Now 4 My pain is aching  In the last 24 hours, has pain interfered with the following? General activity 0 Relation with others 0 Enjoyment of life 0 What TIME of day is your pain at its worst? varies Sleep (in general) Fair  Pain is worse with: some activites Pain improves with: rest Relief from Meds: 0  ability to climb steps?  yes do you drive?  yes  not employed: date last employed .  numbness spasms  Any changes  since last visit?  no  Any changes since last visit?  no    Family History  Problem Relation Age of Onset   Diabetes Maternal Grandmother    Diabetes Maternal Grandfather    Diabetes Paternal Grandmother    Hypertension Paternal Grandfather    Diabetes Paternal Grandfather    Social History   Socioeconomic History   Marital status: Single    Spouse name: Not on file   Number of children: 1   Years of education: Not on file   Highest education level: Not on file  Occupational History    Employer: UNEMPLOYED  Tobacco Use   Smoking status: Never   Smokeless tobacco: Never  Substance and Sexual Activity   Alcohol use: No    Alcohol/week: 0.0 standard drinks of alcohol   Drug use: No   Sexual activity: Yes    Partners: Male    Birth control/protection: Injection  Other Topics Concern   Not on file  Social History Narrative   Not on file   Social Drivers of Health   Financial Resource Strain: Not on file  Food Insecurity: No Food Insecurity (09/22/2023)   Hunger Vital Sign    Worried About Running Out of Food in the Last Year: Never true  Ran Out of Food in the Last Year: Never true  Transportation Needs: Unmet Transportation Needs (09/22/2023)   PRAPARE - Administrator, Civil Service (Medical): Yes    Lack of Transportation (Non-Medical): No  Physical Activity: Not on file  Stress: Not on file  Social Connections: Unknown (01/30/2022)   [facility]   Social Network    Social Network: Not on file   Past Surgical History:  Procedure Laterality Date   CESAREAN SECTION     Past Medical History:  Diagnosis Date   Genital herpes    Gonorrhea    Sjogren's syndrome (HCC) 05/12/2022   SLE (systemic lupus erythematosus) (HCC)    Thyromegaly 09/03/2022   Vitamin D deficiency 06/11/2022   Ht 5\' 2"  (1.575 m)   Wt 113 lb (51.3 kg)   BMI 20.67 kg/m   Opioid Risk Score:   Fall Risk Score:  `1  Depression screen PHQ  2/9      No data to display            Review of Systems  Musculoskeletal:  Positive for back pain and gait problem.  All other systems reviewed and are negative.     Objective:   Physical Exam  Awake,alert, appropriate, sitting on table, no AD, NAD  MS: RLE_ HF 5-/5; KE 5-/5 and KF 4+/5 , DF 4+/5; PF 5-/5 and EHL 5-/5 LLE- HF 4/5; KE and KF 4/5; DF 4+/5; PF 4/5 and EHL 4+/5  Neuro: Decreased to light touch/"tingling" from T10 to T12 B/L Otherwise intact to light touch in all 4 extremities B/L No clonus No increased tone in LE's B/L        Assessment & Plan:   Here for f/u on transverse myelitis - T7 ASIA D- incomplete paraplegia with no spasticity or neurogenic bowel or bladder anymore.    MRI shows improvement and went over with pt- now T4-5 and T6-7- but was T1-T9- is now just spotty- in 2 places.   2. Taking your lupus meds is essential- - if you don't take meds, could increase risk dramatically of it coming back, and getting worse if you don't take meds.    3. Educated that nerves for bowel and bladder are same as for intimacy- so suggest working on by self, before practice.    4.  L leg is a little weaker than R leg- but went over with her- likely to get it back- based on track of your improvement already, it's only been 2 months and you are  90% back, so think will get back to 95-100% better.    5. No neurogenic bowel and bladder   6. Mild at level SCI pain- doesn't want additional meds. Call me if you do, if gets worse.    7.   Do Home exercise program every day- continue standing on heels and toes as well- do 10-15 minutes/day. Got worksheets!  8. F/U in 3 months- single appt- transverse myelitis.     I spent a total of 32   minutes on total care today- >50% coordination of care- due to d/w pt about her SCI- and how to deal with- as detailed above.

## 2023-11-17 ENCOUNTER — Ambulatory Visit: Payer: Medicaid Other

## 2023-11-27 ENCOUNTER — Ambulatory Visit: Payer: Medicaid Other | Admitting: Diagnostic Neuroimaging

## 2024-02-09 ENCOUNTER — Other Ambulatory Visit: Payer: Self-pay

## 2024-02-09 ENCOUNTER — Emergency Department (HOSPITAL_COMMUNITY)

## 2024-02-09 ENCOUNTER — Emergency Department (HOSPITAL_COMMUNITY)
Admission: EM | Admit: 2024-02-09 | Discharge: 2024-02-09 | Disposition: A | Discharge status: Home / Self Care | Attending: Student | Admitting: Student

## 2024-02-09 DIAGNOSIS — R0602 Shortness of breath: Secondary | ICD-10-CM | POA: Insufficient documentation

## 2024-02-09 DIAGNOSIS — E876 Hypokalemia: Secondary | ICD-10-CM | POA: Diagnosis not present

## 2024-02-09 DIAGNOSIS — R072 Precordial pain: Secondary | ICD-10-CM | POA: Diagnosis present

## 2024-02-09 LAB — BASIC METABOLIC PANEL WITH GFR
Anion gap: 11 (ref 5–15)
BUN: <5 mg/dL — ABNORMAL LOW (ref 6–20)
CO2: 23 mmol/L (ref 22–32)
Calcium: 8.8 mg/dL — ABNORMAL LOW (ref 8.9–10.3)
Chloride: 108 mmol/L (ref 98–111)
Creatinine, Ser: 0.49 mg/dL (ref 0.44–1.00)
GFR, Estimated: >60 mL/min (ref >60)
Glucose, Bld: 89 mg/dL (ref 70–99)
Potassium: 2.8 mmol/L — ABNORMAL LOW (ref 3.5–5.1)
Sodium: 142 mmol/L (ref 135–145)

## 2024-02-09 LAB — CBC WITH DIFFERENTIAL/PLATELET
Abs Immature Granulocytes: 0.01 10*3/uL (ref 0.00–0.07)
Basophils Absolute: 0 10*3/uL (ref 0.0–0.1)
Basophils Relative: 0 %
Eosinophils Absolute: 0.1 10*3/uL (ref 0.0–0.5)
Eosinophils Relative: 1 %
HCT: 35.8 % — ABNORMAL LOW (ref 36.0–46.0)
Hemoglobin: 11.3 g/dL — ABNORMAL LOW (ref 12.0–15.0)
Immature Granulocytes: 0 %
Lymphocytes Relative: 28 %
Lymphs Abs: 1.4 10*3/uL (ref 0.7–4.0)
MCH: 27.5 pg (ref 26.0–34.0)
MCHC: 31.6 g/dL (ref 30.0–36.0)
MCV: 87.1 fL (ref 80.0–100.0)
Monocytes Absolute: 0.7 10*3/uL (ref 0.1–1.0)
Monocytes Relative: 12 %
Neutro Abs: 3.1 10*3/uL (ref 1.7–7.7)
Neutrophils Relative %: 59 %
Platelets: 181 10*3/uL (ref 150–400)
RBC: 4.11 MIL/uL (ref 3.87–5.11)
RDW: 14.3 % (ref 11.5–15.5)
WBC: 5.2 10*3/uL (ref 4.0–10.5)
nRBC: 0 % (ref 0.0–0.2)

## 2024-02-09 LAB — I-STAT CHEM 8, ED
BUN: 4 mg/dL — ABNORMAL LOW (ref 6–20)
Calcium, Ion: 1.08 mmol/L — ABNORMAL LOW (ref 1.15–1.40)
Chloride: 108 mmol/L (ref 98–111)
Creatinine, Ser: 0.5 mg/dL (ref 0.44–1.00)
Glucose, Bld: 86 mg/dL (ref 70–99)
HCT: 36 % (ref 36.0–46.0)
Hemoglobin: 12.2 g/dL (ref 12.0–15.0)
Potassium: 2.8 mmol/L — ABNORMAL LOW (ref 3.5–5.1)
Sodium: 143 mmol/L (ref 135–145)
TCO2: 22 mmol/L (ref 22–32)

## 2024-02-09 LAB — TROPONIN I (HIGH SENSITIVITY): Troponin I (High Sensitivity): 5 ng/L (ref <18)

## 2024-02-09 MED ORDER — POTASSIUM CHLORIDE ER 10 MEQ PO TBCR
10.0000 meq | EXTENDED_RELEASE_TABLET | Freq: Every day | ORAL | 0 refills | Status: AC
Start: 2024-02-09 — End: 2024-02-12

## 2024-02-09 MED ORDER — POTASSIUM CHLORIDE CRYS ER 20 MEQ PO TBCR
40.0000 meq | EXTENDED_RELEASE_TABLET | Freq: Once | ORAL | Status: AC
  Administered 2024-02-09: 40 meq via ORAL
  Filled 2024-02-09: qty 2

## 2024-02-09 MED ORDER — IOHEXOL 350 MG/ML SOLN
75.0000 mL | Freq: Once | INTRAVENOUS | Status: AC | PRN
  Administered 2024-02-09: 75 mL via INTRAVENOUS

## 2024-02-09 MED ORDER — INDOMETHACIN 50 MG PO CAPS: 50.0000 mg | ORAL_CAPSULE | Freq: Two times a day (BID) | ORAL | 0 refills | Status: AC

## 2024-02-09 MED ORDER — KETOROLAC TROMETHAMINE 15 MG/ML IJ SOLN
15.0000 mg | Freq: Once | INTRAMUSCULAR | Status: AC
  Administered 2024-02-09: 15 mg via INTRAVENOUS
  Filled 2024-02-09: qty 1

## 2024-02-09 NOTE — ED Provider Notes (Signed)
 [place] [facility]PARTMENT AT [facility] Provider Note   CSN: [id number] Arrival date & time: 02/09/24  2118    History  Chief Complaint  Patient presents with   Chest Pain    Stacy Kelley is a 33 y.o. female history of lupus, transverse myelitis here for evaluation of chest pain.  Pleuritic in nature.  Hurts to take a deep breath.  She has had a mild cough.  Hurts when she lays on her left side.  No pain or swelling to lower legs.  No history of PE or DVT.  No recent surgery, immobilization or malignancy.  States she is still trying to get her strength back from transverse myelitis which she had in December.  Apparently she was hospitalized for a month according to patient and was bedbound.  She is not anticoagulated.  No fever, congestion, rhinorrhea, sore throat, recent illnesses.  No meds PTA. No position changes.  HPI     Home Medications Prior to Admission medications   Medication Sig Start Date End Date Taking? Authorizing Provider  indomethacin (INDOCIN) 50 MG capsule Take 1 capsule (50 mg total) by mouth 2 (two) times daily with a meal for 7 days. 02/09/24 02/16/24 Yes Lorrane Mccay A, PA-C  potassium chloride  (KLOR-CON ) 10 MEQ tablet Take 1 tablet (10 mEq total) by mouth daily for 3 days. 02/09/24 02/12/24 Yes Zonie Crutcher A, PA-C  acetaminophen  (TYLENOL ) 325 MG tablet Take 1-2 tablets (325-650 mg total) by mouth every 4 (four) hours as needed for mild pain (pain score 1-3). 09/21/23   Love, Renay Carota, PA-C  clindamycin  (CLINDAGEL) 1 % gel Apply to area right armpit  2 times daily for 6 weeks 09/22/23 09/20/24  Love, Renay Carota, PA-C  diphenhydrAMINE  (BENADRYL ) 25 MG tablet Take 1 tablet (25 mg total) by mouth 2 (two) times daily. 09/21/23   Love, Renay Carota, PA-C  ferrous gluconate  (FERGON) 324 MG tablet Take 1 tablet (324 mg total) by mouth daily with breakfast. 09/21/23   Love, Renay Carota, PA-C  hydrocortisone  (ANUSOL -HC) 2.5 % rectal cream Place 1 Application  rectally 2 (two) times daily. 09/21/23   Love, Renay Carota, PA-C  hydroxychloroquine  (PLAQUENIL ) 200 MG tablet Take 1 tablet (200 mg total) by mouth daily. 09/21/23   Love, Renay Carota, PA-C  Menthol -Methyl Salicylate  (MUSCLE RUB) 10-15 % CREA Apply 1 Application topically as needed for muscle pain. 09/21/23   Love, Renay Carota, PA-C  senna-docusate (SENOKOT-S) 8.6-50 MG tablet Take 2 tablets by mouth every other day. 09/21/23   Love, Renay Carota, PA-C  sulfamethoxazole -trimethoprim  (BACTRIM  DS) 800-160 MG tablet Take 1 tablet by mouth every 12 (twelve) hours. 09/22/23   Love, Renay Carota, PA-C  Vitamin D , Ergocalciferol , (DRISDOL ) 1.25 MG (50000 UNIT) CAPS capsule Take 1 capsule (50,000 Units total) by mouth every 7 (seven) days. 09/24/23   Love, Renay Carota, PA-C  etonogestrel -ethinyl estradiol  (NUVARING) 0.12-0.015 MG/24HR vaginal ring Insert vaginally and leave in place for 3 consecutive weeks, then remove for 1 week. Patient not taking: Reported on 04/08/2016 11/22/15 11/11/19  Johnsie Nani A, CNM      Allergies    Prednisone , Augmentin  [amoxicillin -pot clavulanate], Doxycycline , and Penicillins    Review of Systems   Review of Systems  Constitutional: Negative.   HENT: Negative.    Respiratory:  Positive for cough and shortness of breath.   Cardiovascular:  Positive for chest pain. Negative for palpitations and leg swelling.  Gastrointestinal: Negative.   Genitourinary: Negative.   Musculoskeletal: Negative.  Skin: Negative.   Neurological: Negative.   All other systems reviewed and are negative.  Physical Exam Updated Vital Signs BP (!) 144/94   Pulse 86   Temp 99.1 F (37.3 C) (Oral)   Resp 16   Ht 5\' 3"  (1.6 m)   Wt 52.2 kg   SpO2 100%   BMI 20.37 kg/m  Physical Exam Vitals and nursing note reviewed.  Constitutional:      General: She is not in acute distress.    Appearance: She is well-developed. She is not ill-appearing, toxic-appearing or diaphoretic.  HENT:     Head:  Atraumatic.  Eyes:     Pupils: Pupils are equal, round, and reactive to light.  Cardiovascular:     Rate and Rhythm: Normal rate.     Pulses:          Radial pulses are 2+ on the right side and 2+ on the left side.       Dorsalis pedis pulses are 2+ on the right side and 2+ on the left side.     Heart sounds: Normal heart sounds.  Pulmonary:     Effort: Pulmonary effort is normal. No respiratory distress.     Breath sounds: Normal breath sounds.  Chest:     Chest wall: No mass.  Abdominal:     General: Bowel sounds are normal. There is no distension.     Palpations: Abdomen is soft.  Musculoskeletal:        General: Normal range of motion.     Cervical back: Normal range of motion.     Right lower leg: No tenderness. No edema.     Left lower leg: No tenderness. No edema.  Skin:    General: Skin is warm and dry.     Capillary Refill: Capillary refill takes less than 2 seconds.  Neurological:     General: No focal deficit present.     Mental Status: She is alert.     Cranial Nerves: No cranial nerve deficit.     Motor: No weakness.  Psychiatric:        Mood and Affect: Mood normal.    ED Results / Procedures / Treatments   Labs (all labs ordered are listed, but only abnormal results are displayed) Labs Reviewed  CBC WITH DIFFERENTIAL/PLATELET - Abnormal; Notable for the following components:      Result Value   Hemoglobin 11.3 (*)    HCT 35.8 (*)    All other components within normal limits  BASIC METABOLIC PANEL WITH GFR - Abnormal; Notable for the following components:   Potassium 2.8 (*)    BUN <5 (*)    Calcium 8.8 (*)    All other components within normal limits  I-STAT CHEM 8, ED - Abnormal; Notable for the following components:   Potassium 2.8 (*)    BUN 4 (*)    Calcium, Ion 1.08 (*)    All other components within normal limits  RESP PANEL BY RT-PCR (RSV, FLU A&B, COVID)  RVPGX2  HCG, SERUM, QUALITATIVE  MAGNESIUM   TROPONIN I (HIGH SENSITIVITY)  TROPONIN  I (HIGH SENSITIVITY)    EKG None  Radiology CT Angio Chest PE W and/or Wo Contrast Result Date: 02/09/2024 CLINICAL DATA:  History of lupus presenting with chest pain. EXAM: CT ANGIOGRAPHY CHEST WITH CONTRAST TECHNIQUE: Multidetector CT imaging of the chest was performed using the standard protocol during bolus administration of intravenous contrast. Multiplanar CT image reconstructions and MIPs were obtained to evaluate the  vascular anatomy. RADIATION DOSE REDUCTION: This exam was performed according to the departmental dose-optimization program which includes automated exposure control, adjustment of the mA and/or kV according to patient size and/or use of iterative reconstruction technique. CONTRAST:  75mL OMNIPAQUE  IOHEXOL  350 MG/ML SOLN COMPARISON:  December 30, 2013 FINDINGS: Cardiovascular: Satisfactory opacification of the pulmonary arteries to the segmental level. No evidence of pulmonary embolism. Normal heart size. No pericardial effusion. Mediastinum/Nodes: No enlarged mediastinal, hilar, or axillary lymph nodes. Thyroid gland, trachea, and esophagus demonstrate no significant findings. Lungs/Pleura: Mild linear atelectasis is seen within the posterior aspect of the left lower lobe. There is no evidence of an acute infiltrate, pleural effusion or pneumothorax. Upper Abdomen: No acute abnormality. Musculoskeletal: No chest wall abnormality. No acute or significant osseous findings. Review of the MIP images confirms the above findings. IMPRESSION: 1. No evidence of pulmonary embolism or other acute intrathoracic process. Electronically Signed   By: Virgle Grime M.D.   On: 02/09/2024 22:49   DG Chest 2 View Result Date: 02/09/2024 CLINICAL DATA:  Chest pain. EXAM: CHEST - 2 VIEW COMPARISON:  December 29, 2013 FINDINGS: The heart size and mediastinal contours are within normal limits. Both lungs are clear. The visualized skeletal structures are unremarkable. IMPRESSION: No active cardiopulmonary  disease. Electronically Signed   By: Virgle Grime M.D.   On: 02/09/2024 21:59    Procedures Procedures    Medications Ordered in ED Medications  potassium chloride  SA (KLOR-CON  M) CR tablet 40 mEq (has no administration in time range)  ketorolac  (TORADOL ) 15 MG/ML injection 15 mg (has no administration in time range)  iohexol  (OMNIPAQUE ) 350 MG/ML injection 75 mL (75 mLs Intravenous Contrast Given 02/09/24 2240)   ED Course/ Medical Decision Making/ A&P   33 year old here for evaluation chest pain shortness of breath.  Pleuritic in nature.  Located to left chest.  History of lupus.  No history of PE or DVT.  No pain or swelling to lower legs.  She states she is to start and regain her strength after being diagnosed with transverse myelitis end of December with subsequent 1 month hospitalization.  Some cough however no fever.  No sick contacts.  Will plan on labs, imaging and reassess  Labs and imaging personally viewed and interpreted:  Chest xray without significant abnormality CBC without leukocytosis, hemoglobin 11.3 Metabolic panel potassium 2.8 Troponin 5--suspected patient had ACS related troponin to be elevated this point given symptoms started greater than 24 hours ago Patient refused viral testing CTA negative for acute PE, pulmonary edema, pericardial effusion  Patient declined pregnancy test, viral testing. See nursing note. States there is no way she could be pregnant.  Patient reassessed.  Discussed labs and imaging.  At this time I have low suspicion for acute ACS, PE, dissection, pericarditis, endocarditis, pneumothorax, pneumonia.  Will have her start on some anti-inflammatories close follow-up with PCP.  She was given oral supplementation for hypokalemia.  Will give her a few days outpatient as well and have her follow-up with her PCP.   The patient has been appropriately medically screened and/or stabilized in the ED. I have low suspicion for any other emergent  medical condition which would require further screening, evaluation or treatment in the ED or require inpatient management.  Patient is hemodynamically stable and in no acute distress.  Patient able to ambulate in department prior to ED.  Evaluation does not show acute pathology that would require ongoing or additional emergent interventions while in the emergency department or further  inpatient treatment.  I have discussed the diagnosis with the patient and answered all questions.  Pain is been managed while in the emergency department and patient has no further complaints prior to discharge.  Patient is comfortable with plan discussed in room and is stable for discharge at this time.  I have discussed strict return precautions for returning to the emergency department.  Patient was encouraged to follow-up with PCP/specialist refer to at discharge.                                 Medical Decision Making Amount and/or Complexity of Data Reviewed Independent Historian: EMS External Data Reviewed: labs, radiology, ECG and notes. Labs: ordered. Decision-making details documented in ED Course. Radiology: ordered and independent interpretation performed. Decision-making details documented in ED Course. ECG/medicine tests: ordered and independent interpretation performed. Decision-making details documented in ED Course.  Risk OTC drugs. Prescription drug management. Parenteral controlled substances. Decision regarding hospitalization. Diagnosis or treatment significantly limited by social determinants of health.         Final Clinical Impression(s) / ED Diagnoses Final diagnoses:  Precordial pain  Hypokalemia    Rx / DC Orders ED Discharge Orders          Ordered    potassium chloride  (KLOR-CON ) 10 MEQ tablet  Daily        02/09/24 2302    indomethacin (INDOCIN) 50 MG capsule  2 times daily with meals        02/09/24 2302              Kaneesha Constantino A, PA-C 02/09/24  2311    Kommor, Alyse July, MD 02/10/24 1155

## 2024-02-09 NOTE — ED Notes (Addendum)
 Pt refused nasal swab. Pt stating "I already know I don't have COVID". RN and MD made aware

## 2024-02-09 NOTE — ED Notes (Signed)
 Pt refused hcg lab draw. Pt states that she knows she is not pregnant, so it's not needed. EDP notified.

## 2024-02-09 NOTE — Discharge Instructions (Signed)
 It was a pleasure taking care of you in the ED today  We started you on a few medications to help with your symptoms  Potassium supplementation -you will take this once daily, make sure to follow-up outpatient with primary care provider later on the week for recheck of your labs Indocin-this is an anti-inflammatory medication to help with your chest pain.  Do not take any additional ibuprofen  containing products while taking this  Return for new or worsening symptoms

## 2024-02-09 NOTE — ED Triage Notes (Signed)
 Pt BIB by EMS from home w/ reports of 10/10 CP since 4PM yesterday. Pt has a hx of Lupas that was diagnosed in 2023.

## 2024-02-09 NOTE — ED Notes (Signed)
 Pt refused her Resp Panel Swab. PA notified.

## 2024-02-15 ENCOUNTER — Encounter
Payer: Medicaid Other | Attending: Physical Medicine and Rehabilitation | Admitting: Physical Medicine and Rehabilitation

## 2024-02-15 DIAGNOSIS — G373 Acute transverse myelitis in demyelinating disease of central nervous system: Secondary | ICD-10-CM | POA: Insufficient documentation

## 2024-02-15 DIAGNOSIS — M328 Other forms of systemic lupus erythematosus: Secondary | ICD-10-CM | POA: Insufficient documentation

## 2024-03-14 ENCOUNTER — Ambulatory Visit: Admitting: Diagnostic Neuroimaging

## 2024-03-14 ENCOUNTER — Encounter: Payer: Self-pay | Admitting: Diagnostic Neuroimaging

## 2024-03-14 VITALS — BP 108/68 | HR 98 | Ht 62.0 in | Wt 109.8 lb

## 2024-03-14 DIAGNOSIS — M3501 Sicca syndrome with keratoconjunctivitis: Secondary | ICD-10-CM | POA: Diagnosis not present

## 2024-03-14 DIAGNOSIS — G360 Neuromyelitis optica [Devic]: Secondary | ICD-10-CM | POA: Diagnosis not present

## 2024-03-14 DIAGNOSIS — M3219 Other organ or system involvement in systemic lupus erythematosus: Secondary | ICD-10-CM | POA: Diagnosis not present

## 2024-03-14 DIAGNOSIS — G373 Acute transverse myelitis in demyelinating disease of central nervous system: Secondary | ICD-10-CM

## 2024-03-14 NOTE — Progress Notes (Signed)
 [facility]  PATIENT: Stacy Kelley DOB: 05-11-1991  REFERRING CLINICIAN: Tova Fresh, PA-C HISTORY FROM: patient  REASON FOR VISIT: new consult   HISTORICAL  CHIEF COMPLAINT:  Chief Complaint  Patient presents with   New Patient (Initial Visit)    RM 7, Pt alone. Pt referred by PCP. Pt was having chest pain on left side underneath breast. Pt states had MRI in January 2025 for transverse myelitis, PCP is wanting to know if MRI should be repeated. Pt states she feels it is getting better but still has some pain.    HISTORY OF PRESENT ILLNESS:   33 year old female with history of lupus, sjogren's syndrome, here for hospital follow-up of transverse myelitis from December 2024.  09/06/2023 patient presented to hospital due to generalized myalgia, possibly related to lupus flareup.  She was treated with steroids in the emergency room and discharged home.  She returned to ER on 09/09/2023 due to worsening numbness and myalgias from her torso down to her lower extremities.  She also developed incontinence and some rash.  Patient was also noted to be hyperreflexic.  She was admitted for further workup and evaluation.  MRI of the thoracic spine showed longitudinally extensive transverse myelitis extending from T1-T9.  Extensive lumbar puncture, blood work testing were ordered.  She also received 5-day course of high-dose IV Solu-Medrol  and symptoms started to slightly improved.  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  She was transitioned to inpatient rehab and ultimately discharged on 09/18/2023.  Patient gradually improved.  She followed with PCP and rheumatology.  She has started taking hydroxychloroquine  200 mg daily on a more regular basis for her lupus.  Patient denies any unilateral or bilateral visual loss or eye pain.  No problems with upper extremities.  Still has some  mild residual tightness around her torso down to her legs.    REVIEW OF SYSTEMS: Full 14 system review of systems performed and negative with exception of: as per HPI.  ALLERGIES: Allergies  Allergen Reactions   Methylprednisolone  Other (See Comments)    Purpura of lower extremities   Prednisone  Other (See Comments)    Feet became swollen and blotchy rashes appeared on the feet shortly after starting this   Augmentin  [Amoxicillin -Pot Clavulanate] Swelling and Rash   Doxycycline  Swelling, Rash and Other (See Comments)    Feet became swollen and blotchy rashes appeared on the feet shortly after starting this   Penicillins Swelling, Rash and Other (See Comments)    Feet became swollen and blotchy rashes appeared on the feet shortly after starting this    HOME MEDICATIONS: Outpatient Medications Prior to Visit  Medication Sig Dispense Refill   acetaminophen  (TYLENOL ) 325 MG tablet Take 1-2 tablets (325-650 mg total) by mouth every 4 (four) hours as needed for mild pain (pain score 1-3). 100 tablet 0   betamethasone dipropionate 0.05 % cream Apply 1 Application topically.     Cholecalciferol  1.25 MG (50000 UT) capsule Take 1 capsule by mouth.     clobetasol (TEMOVATE) 0.05 % external solution Apply topically 2 (two) times daily.     diphenhydrAMINE  (BENADRYL ) 25 MG tablet Take 1 tablet (25 mg total) by mouth 2 (two) times daily. 10 tablet 0   ferrous gluconate  (FERGON) 324 MG tablet Take 324 mg by mouth.     hydrocortisone  (ANUSOL -HC) 2.5 % rectal cream Place 1 Application rectally 2 (two) times  daily. 30 g 0   hydroxychloroquine  (PLAQUENIL ) 200 MG tablet Take 200 mg by mouth daily.     Menthol -Methyl Salicylate  (MUSCLE RUB) 10-15 % CREA Apply 1 Application topically as needed for muscle pain. 85 g 0   senna-docusate (SENOKOT-S) 8.6-50 MG tablet Take 2 tablets by mouth every other day. 30 tablet 0   traMADol  (ULTRAM ) 50 MG tablet Take 50 mg by mouth every 6 (six) hours as needed.      triamcinolone  cream (KENALOG ) 0.1 % Apply topically.     valACYclovir  (VALTREX ) 500 MG tablet Take 500 mg by mouth 2 (two) times daily.     clindamycin  (CLINDAGEL) 1 % gel Apply to area right armpit  2 times daily for 6 weeks (Patient not taking: Reported on 03/14/2024) 60 g 1   methylPREDNISolone  (MEDROL  DOSEPAK) 4 MG TBPK tablet Take by mouth as directed. (Patient not taking: Reported on 03/14/2024)     potassium chloride  (KLOR-CON ) 10 MEQ tablet Take 1 tablet (10 mEq total) by mouth daily for 3 days. (Patient not taking: Reported on 03/14/2024) 3 tablet 0   sulfamethoxazole -trimethoprim  (BACTRIM  DS) 800-160 MG tablet Take 1 tablet by mouth every 12 (twelve) hours. (Patient not taking: Reported on 03/14/2024) 5 tablet 0   No facility-administered medications prior to visit.    PAST MEDICAL HISTORY: Past Medical History:  Diagnosis Date   Genital herpes    Gonorrhea    Lupus (systemic lupus erythematosus) (HCC) 04/2022   Sjogren's syndrome (HCC) 05/12/2022   SLE (systemic lupus erythematosus) (HCC)    Thyromegaly 09/03/2022   Vitamin D  deficiency 06/11/2022    PAST SURGICAL HISTORY: Past Surgical History:  Procedure Laterality Date   CESAREAN SECTION      FAMILY HISTORY: Family History  Problem Relation Age of Onset   Diabetes Maternal Grandmother    Diabetes Maternal Grandfather    Diabetes Paternal Grandmother    Hypertension Paternal Grandfather    Diabetes Paternal Grandfather     SOCIAL HISTORY: Social History   Socioeconomic History   Marital status: Single    Spouse name: Not on file   Number of children: 1   Years of education: Not on file   Highest education level: Not on file  Occupational History    Employer: UNEMPLOYED  Tobacco Use   Smoking status: Never   Smokeless tobacco: Never  Vaping Use   Vaping status: Never Used  Substance and Sexual Activity   Alcohol use: No    Alcohol/week: 0.0 standard drinks of alcohol   Drug use: Yes    Types:  Marijuana, Methaqualone    Comment: smokes daily   Sexual activity: Yes    Partners: Male    Birth control/protection: Injection  Other Topics Concern   Not on file  Social History Narrative   Currently unemployed due to health   Social Drivers of Health   Financial Resource Strain: Not on file  Food Insecurity: No Food Insecurity (09/22/2023)   Hunger Vital Sign    Worried About Running Out of Food in the Last Year: Never true    Ran Out of Food in the Last Year: Never true  Transportation Needs: Unmet Transportation Needs (09/22/2023)   PRAPARE - Administrator, Civil Service (Medical): Yes    Lack of Transportation (Non-Medical): No  Physical Activity: Not on file  Stress: Not on file  Social Connections: Unknown (01/30/2022)   Received from [facility]   Social Network    Social Network: Not on  file  Intimate Partner Violence: Unknown (01/02/2022)   [facility]   HITS    Physically Hurt: Not on file    Insult or Talk Down To: Not on file    Threaten Physical Harm: Not on file    Scream or Curse: Not on file     PHYSICAL EXAM  GENERAL EXAM/CONSTITUTIONAL: Vitals:  Vitals:   03/14/24 0931  BP: 108/68  Pulse: 98  Weight: 109 lb 12.8 oz (49.8 kg)  Height: 5' 2 (1.575 m)   Body mass index is 20.08 kg/m. Wt Readings from Last 3 Encounters:  03/14/24 109 lb 12.8 oz (49.8 kg)  02/09/24 115 lb (52.2 kg)  11/16/23 113 lb (51.3 kg)   Patient is in no distress; well developed, nourished and groomed; neck is supple  CARDIOVASCULAR: Examination of carotid arteries is normal; no carotid bruits Regular rate and rhythm, no murmurs Examination of peripheral vascular system by observation and palpation is normal  EYES: Ophthalmoscopic exam of optic discs and posterior segments is normal; no papilledema or hemorrhages No results found.  MUSCULOSKELETAL: Gait, strength, tone, movements noted in Neurologic exam below  NEUROLOGIC: MENTAL  STATUS:      No data to display         awake, alert, oriented to person, place and time recent and remote memory intact normal attention and concentration language fluent, comprehension intact, naming intact fund of knowledge appropriate  CRANIAL NERVE:  2nd - no papilledema on fundoscopic exam 2nd, 3rd, 4th, 6th - pupils equal and reactive to light, visual fields full to confrontation, extraocular muscles intact, no nystagmus 5th - facial sensation symmetric 7th - facial strength symmetric 8th - hearing intact 9th - palate elevates symmetrically, uvula midline 11th - shoulder shrug symmetric 12th - tongue protrusion midline  MOTOR:  normal bulk and tone, full strength in the BUE, BLE  SENSORY:  normal and symmetric to light touch, temperature, vibration  COORDINATION:  finger-nose-finger, fine finger movements normal  REFLEXES:  deep tendon reflexes 1+ and symmetric  GAIT/STATION:  narrow based gait     DIAGNOSTIC DATA (LABS, IMAGING, TESTING) - I reviewed patient records, labs, notes, testing and imaging myself where available.  Lab Results  Component Value Date   WBC 5.2 02/09/2024   HGB 12.2 02/09/2024   HCT 36.0 02/09/2024   MCV 87.1 02/09/2024   PLT 181 02/09/2024      Component Value Date/Time   NA 143 02/09/2024 2134   K 2.8 (L) 02/09/2024 2134   CL 108 02/09/2024 2134   CO2 23 02/09/2024 2122   GLUCOSE 86 02/09/2024 2134   BUN 4 (L) 02/09/2024 2134   CREATININE 0.50 02/09/2024 2134   CALCIUM 8.8 (L) 02/09/2024 2122   PROT 7.3 09/17/2023 1719   ALBUMIN 3.6 09/17/2023 1719   ALBUMIN 3.7 (L) 09/09/2023 0529   AST 13 (L) 09/17/2023 1719   ALT 19 09/17/2023 1719   ALKPHOS 50 09/17/2023 1719   BILITOT 0.8 09/17/2023 1719   GFRNONAA >60 02/09/2024 2122   GFRAA >90 12/29/2013 2208   No results found for: CHOL, HDL, LDLCALC, LDLDIRECT, TRIG, CHOLHDL No results found for: UJWJ1B Lab Results  Component Value Date   VITAMINB12  470 09/14/2023   No results found for: TSH   CSF studies: RBC: 0 WBC: 2 Differential: Too few to count Glucose 40 Protein 52 CSF VDRL negative CSF VZV negative    IgG CSF index -IgG, CSF 11.3 -Albumin, CSF 23 -IgG, serum 2050 -Albumin 3.7 -  IgG/Albumin ratio, CSF 0.49 -CSF IgG index 0.9   Serum: RPR negative T palladium negative Meningitis/encephalitis panel negative ESR elevated 85 SSB antibody elevated >8.0 Vitamin D  low 10.32 Anti-MOG negative CMV, RPR - negative HIV ab negative HTLV 1+2 ab -negative Serum ACE negative Serum RF negative  09/10/23    Component Ref Range & Units (hover) 6 mo ago  NMO-IgG 2,740.3 High       09/09/23 MRI cervical / thoracic / lumbar spine [I reviewed images myself and agree with interpretation. -VRP] - Longitudinally extensive spinal cord lesion spanning T1-T9, history of lupus suggesting autoimmune cause.  09/11/23 MRI brain - Normal brain MRI. No acute intracranial abnormality or findings to explain patient's symptoms.  10/09/23 MRI thoracic spine [I reviewed images myself and agree with interpretation. Improved from Dec 2024. -VRP]  - Nonenhancing right eccentric T2 hyperintense cord signal spanning from T4-T5 to T6-T7, improved since 09/09/2023.    ASSESSMENT AND PLAN  33 y.o. year old female here with:   Dx:  1. Neuromyelitis optica (devic) (HCC)   2. Transverse myelitis (HCC)   3. Other systemic lupus erythematosus with other organ involvement (HCC)   4. Sjogren's syndrome with keratoconjunctivitis sicca (HCC)     PLAN:  Neuromyelitis optica --> LONGITUDINALLY EXTENSIVE TRANSVERSE MYELITIS (T1-T9) --> follow-up of hospital lab testing done in December 2024 shows positive anti-NMO antibody; also history of lupus and sjogren's syndrome - plan to start rituximab IV every 6 months (starting with two 1 g infusions separated by a two-week interval, followed by a 1 g infusion every six months) - currently on  hydroxychloroquine  for lupus / sjogren's; will discuss with rheumatology re: starting rituximab  Orders Placed This Encounter  Procedures   MR CERVICAL SPINE W WO CONTRAST   MR THORACIC SPINE W WO CONTRAST   Hep B Surface Antibody   Hepatitis B surface antigen   Hepatitis B core antibody, total   CBC with Differential/Platelet   Immunoglobulins, QN, A/E/G/M   Return in about 1 month (around 04/13/2024) for MyChart visit (15 min).  I spent 60 minutes of face-to-face and non-face-to-face time with patient.  This included previsit chart review, lab review, study review, order entry, electronic health record documentation, patient education.    Omega Bible, MD 03/14/2024, 10:24 AM Certified in Neurology, Neurophysiology and Neuroimaging  [facility] [address] [place], [place] [address] [phone]

## 2024-03-17 LAB — CBC WITH DIFFERENTIAL/PLATELET
Basophils Absolute: 0 10*3/uL (ref 0.0–0.2)
Basos: 1 %
EOS (ABSOLUTE): 0.1 10*3/uL (ref 0.0–0.4)
Eos: 2 %
Hematocrit: 37.4 % (ref 34.0–46.6)
Hemoglobin: 11.4 g/dL (ref 11.1–15.9)
Immature Grans (Abs): 0 10*3/uL (ref 0.0–0.1)
Immature Granulocytes: 0 %
Lymphocytes Absolute: 1.2 10*3/uL (ref 0.7–3.1)
Lymphs: 37 %
MCH: 27.1 pg (ref 26.6–33.0)
MCHC: 30.5 g/dL — ABNORMAL LOW (ref 31.5–35.7)
MCV: 89 fL (ref 79–97)
Monocytes Absolute: 0.4 10*3/uL (ref 0.1–0.9)
Monocytes: 13 %
Neutrophils Absolute: 1.5 10*3/uL (ref 1.4–7.0)
Neutrophils: 47 %
Platelets: 195 10*3/uL (ref 150–450)
RBC: 4.2 x10E6/uL (ref 3.77–5.28)
RDW: 14.3 % (ref 11.7–15.4)
WBC: 3.3 10*3/uL — ABNORMAL LOW (ref 3.4–10.8)

## 2024-03-17 LAB — IMMUNOGLOBULINS A/E/G/M, SERUM
IgA/Immunoglobulin A, Serum: 231 mg/dL (ref 87–352)
IgE (Immunoglobulin E), Serum: 7 [IU]/mL (ref 6–495)
IgG (Immunoglobin G), Serum: 2234 mg/dL — ABNORMAL HIGH (ref 586–1602)
IgM (Immunoglobulin M), Srm: 99 mg/dL (ref 26–217)

## 2024-03-17 LAB — HEPATITIS B CORE ANTIBODY, TOTAL: Hep B Core Total Ab: NEGATIVE

## 2024-03-17 LAB — HEPATITIS B SURFACE ANTIGEN: Hepatitis B Surface Ag: NEGATIVE

## 2024-03-17 LAB — HEPATITIS B SURFACE ANTIBODY,QUALITATIVE: Hep B Surface Ab, Qual: NONREACTIVE

## 2024-03-23 ENCOUNTER — Ambulatory Visit: Payer: Self-pay | Admitting: Diagnostic Neuroimaging

## 2024-03-23 ENCOUNTER — Telehealth: Payer: Self-pay | Admitting: Diagnostic Neuroimaging

## 2024-03-23 NOTE — Telephone Encounter (Signed)
 I faxed this conversation with the MRI cervical results from December and will see what they say.

## 2024-03-23 NOTE — Telephone Encounter (Signed)
 Her insurance approved the thoracic spine MRI but for the cervical they sent back: notes from your doctor that say why the test (Cervical Spine MRI (Magnetic Resonance Imaging)) you already had on 09/09/23 is not enough to help treat you.  Without this additional information, your request did not meet criteria for approval   If you add something to your office note I can send them again thanks!

## 2024-03-24 NOTE — Telephone Encounter (Signed)
 This is a message from GI: Good morning [place], this patient is scheduled for 03/25/2024 I saw where her insurance approved for her to have MRI thoracic spine but have not approved for the MRI Cervical spine. I called patient and she stated she will just wait until insurance approve for her to have cervical MRI where she can do both procedures at one time. I did inform her that we are not sure if they would approve for the cervical procedure or not and she can go ahead and do the MRI thoracic spine; but, she just want to cancel both appts.

## 2024-03-25 ENCOUNTER — Other Ambulatory Visit

## 2024-03-28 NOTE — Telephone Encounter (Signed)
 MRI cervical [employer] shara: [id number] exp. 03/15/24-04/14/24 sent to GI and they will reschedule both. [phone]

## 2024-03-29 NOTE — Telephone Encounter (Signed)
 Dr. Margaret requested RN to reach out to rheumatology regarding new dx of neuromyelitis optica and the possibility of using new medication Ruxience (rituximab). [facility] where Pt sees [place], [place]. Spoke with Octavia who stated she will send a message to nurse and PA regarding question about new medication and someone from their office will reach out to [facility].

## 2024-03-29 NOTE — Telephone Encounter (Signed)
 Yes, agree, they are doing infusion there?

## 2024-03-29 NOTE — Telephone Encounter (Signed)
 Robin with Dr.Penimallis office and they are wanting to know if it is okay to start the patient on Ruxince. Please advise, call back is [phone].

## 2024-03-29 NOTE — Telephone Encounter (Signed)
 Please see the message below in thread.  Patient has recently seen neurology.   Please advise

## 2024-03-31 NOTE — Telephone Encounter (Signed)
 Reached out to [facility] regarding treatment recommended by our provider for dx of neuromyelitis optica. Spoke w/Mya, who will send message to Redell Ness, PA. Asked if someone from their office can let us  know if PA is on board with treatment or needs further information. Mya put this in message as well.

## 2024-04-07 ENCOUNTER — Telehealth: Admitting: Diagnostic Neuroimaging

## 2024-04-08 ENCOUNTER — Ambulatory Visit
Admission: RE | Admit: 2024-04-08 | Discharge: 2024-04-08 | Disposition: A | Discharge status: Home / Self Care | Source: Ambulatory Visit | Attending: Diagnostic Neuroimaging | Admitting: Diagnostic Neuroimaging

## 2024-04-08 DIAGNOSIS — G360 Neuromyelitis optica [Devic]: Secondary | ICD-10-CM

## 2024-04-08 DIAGNOSIS — G373 Acute transverse myelitis in demyelinating disease of central nervous system: Secondary | ICD-10-CM

## 2024-04-14 ENCOUNTER — Encounter: Payer: Self-pay | Admitting: Diagnostic Neuroimaging

## 2024-04-14 ENCOUNTER — Telehealth: Admitting: Diagnostic Neuroimaging

## 2024-04-14 DIAGNOSIS — G360 Neuromyelitis optica [Devic]: Secondary | ICD-10-CM

## 2024-04-14 DIAGNOSIS — M3501 Sicca syndrome with keratoconjunctivitis: Secondary | ICD-10-CM | POA: Diagnosis not present

## 2024-04-14 DIAGNOSIS — M3219 Other organ or system involvement in systemic lupus erythematosus: Secondary | ICD-10-CM

## 2024-04-14 NOTE — Progress Notes (Signed)
 [facility]  PATIENT: Stacy Kelley DOB: November 24, 1990  REFERRING CLINICIAN: Allen Lauraine CROME, PA-C HISTORY FROM: patient  REASON FOR VISIT: new consult   HISTORICAL  CHIEF COMPLAINT:  Chief Complaint  Patient presents with   NMO follow up    HISTORY OF PRESENT ILLNESS:   UPDATE (04/14/24, VRP): Since last visit, symptoms are stable. Here to discuss NMO diagnosis and treatment options.   PRIOR HPI (03/14/24, VRP): 32 year old female with history of lupus, sjogren's syndrome, here for hospital follow-up of transverse myelitis from December 2024.  09/06/2023 patient presented to hospital due to generalized myalgia, possibly related to lupus flareup.  She was treated with steroids in the emergency room and discharged home.  She returned to ER on 09/09/2023 due to worsening numbness and myalgias from her torso down to her lower extremities.  She also developed incontinence and some rash.  Patient was also noted to be hyperreflexic.  She was admitted for further workup and evaluation.  MRI of the thoracic spine showed longitudinally extensive transverse myelitis extending from T1-T9.  Extensive lumbar puncture, blood work testing were ordered.  She also received 5-day course of high-dose IV Solu-Medrol  and symptoms started to slightly improved.  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  She was transitioned to inpatient rehab and ultimately discharged on 09/18/2023.  Patient gradually improved.  She followed with PCP and rheumatology.  She has started taking hydroxychloroquine  200 mg daily on a more regular basis for her lupus.  Patient denies any unilateral or bilateral visual loss or eye pain.  No problems with upper extremities.  Still has some mild residual tightness around her torso down to her legs.    REVIEW OF SYSTEMS: Full 14 system review of systems performed and negative  with exception of: as per HPI.  ALLERGIES: Allergies  Allergen Reactions   Methylprednisolone  Other (See Comments)    Purpura of lower extremities   Prednisone  Other (See Comments)    Feet became swollen and blotchy rashes appeared on the feet shortly after starting this   Augmentin  [Amoxicillin -Pot Clavulanate] Swelling and Rash   Doxycycline  Swelling, Rash and Other (See Comments)    Feet became swollen and blotchy rashes appeared on the feet shortly after starting this   Penicillins Swelling, Rash and Other (See Comments)    Feet became swollen and blotchy rashes appeared on the feet shortly after starting this    HOME MEDICATIONS: Outpatient Medications Prior to Visit  Medication Sig Dispense Refill   acetaminophen  (TYLENOL ) 325 MG tablet Take 1-2 tablets (325-650 mg total) by mouth every 4 (four) hours as needed for mild pain (pain score 1-3). 100 tablet 0   betamethasone dipropionate 0.05 % cream Apply 1 Application topically.     Cholecalciferol  1.25 MG (50000 UT) capsule Take 1 capsule by mouth.     clobetasol (TEMOVATE) 0.05 % external solution Apply topically 2 (two) times daily.     diphenhydrAMINE  (BENADRYL ) 25 MG tablet Take 1 tablet (25 mg total) by mouth 2 (two) times daily. 10 tablet 0   ferrous gluconate  (FERGON) 324 MG tablet Take 324 mg by mouth.     hydrocortisone  (ANUSOL -HC) 2.5 % rectal cream Place 1 Application rectally 2 (two) times daily. 30 g 0   hydroxychloroquine  (PLAQUENIL ) 200 MG tablet Take 200 mg by mouth daily.     Menthol -Methyl Salicylate  (MUSCLE RUB) 10-15 % CREA Apply 1 Application topically  as needed for muscle pain. 85 g 0   senna-docusate (SENOKOT-S) 8.6-50 MG tablet Take 2 tablets by mouth every other day. 30 tablet 0   traMADol  (ULTRAM ) 50 MG tablet Take 50 mg by mouth every 6 (six) hours as needed.     triamcinolone  cream (KENALOG ) 0.1 % Apply topically.     valACYclovir  (VALTREX ) 500 MG tablet Take 500 mg by mouth 2 (two) times daily.      No facility-administered medications prior to visit.    PAST MEDICAL HISTORY: Past Medical History:  Diagnosis Date   Genital herpes    Gonorrhea    Lupus (systemic lupus erythematosus) (HCC) 04/2022   Sjogren's syndrome (HCC) 05/12/2022   SLE (systemic lupus erythematosus) (HCC)    Thyromegaly 09/03/2022   Vitamin D  deficiency 06/11/2022    PAST SURGICAL HISTORY: Past Surgical History:  Procedure Laterality Date   CESAREAN SECTION      FAMILY HISTORY: Family History  Problem Relation Age of Onset   Diabetes Maternal Grandmother    Diabetes Maternal Grandfather    Diabetes Paternal Grandmother    Hypertension Paternal Grandfather    Diabetes Paternal Grandfather     SOCIAL HISTORY: Social History   Socioeconomic History   Marital status: Single    Spouse name: Not on file   Number of children: 1   Years of education: Not on file   Highest education level: Not on file  Occupational History    Employer: UNEMPLOYED  Tobacco Use   Smoking status: Never   Smokeless tobacco: Never  Vaping Use   Vaping status: Never Used  Substance and Sexual Activity   Alcohol use: No    Alcohol/week: 0.0 standard drinks of alcohol   Drug use: Yes    Types: Marijuana, Methaqualone    Comment: smokes daily   Sexual activity: Yes    Partners: Male    Birth control/protection: Injection  Other Topics Concern   Not on file  Social History Narrative   Currently unemployed due to health   Social Drivers of Health   Financial Resource Strain: Not on file  Food Insecurity: No Food Insecurity (09/22/2023)   Hunger Vital Sign    Worried About Running Out of Food in the Last Year: Never true    Ran Out of Food in the Last Year: Never true  Transportation Needs: Unmet Transportation Needs (09/22/2023)   PRAPARE - Administrator, Civil Service (Medical): Yes    Lack of Transportation (Non-Medical): No  Physical Activity: Not on file  Stress: Not on file  Social  Connections: Unknown (01/30/2022)   Received from [facility]   Social Network    Social Network: Not on file  Intimate Partner Violence: Unknown (01/02/2022)   Received from [facility]   HITS    Physically Hurt: Not on file    Insult or Talk Down To: Not on file    Threaten Physical Harm: Not on file    Scream or Curse: Not on file     PHYSICAL EXAM  GENERAL EXAM/CONSTITUTIONAL: Vitals:  There were no vitals filed for this visit.  There is no height or weight on file to calculate BMI. Wt Readings from Last 3 Encounters:  03/14/24 109 lb 12.8 oz (49.8 kg)  02/09/24 115 lb (52.2 kg)  11/16/23 113 lb (51.3 kg)   Patient is in no distress; well developed, nourished and groomed; neck is supple  CARDIOVASCULAR: Examination of carotid arteries is normal; no carotid bruits Regular  rate and rhythm, no murmurs Examination of peripheral vascular system by observation and palpation is normal  EYES: Ophthalmoscopic exam of optic discs and posterior segments is normal; no papilledema or hemorrhages No results found.  MUSCULOSKELETAL: Gait, strength, tone, movements noted in Neurologic exam below  NEUROLOGIC: MENTAL STATUS:      No data to display         awake, alert, oriented to person, place and time recent and remote memory intact normal attention and concentration language fluent, comprehension intact, naming intact fund of knowledge appropriate  CRANIAL NERVE:  2nd - no papilledema on fundoscopic exam 2nd, 3rd, 4th, 6th - pupils equal and reactive to light, visual fields full to confrontation, extraocular muscles intact, no nystagmus 5th - facial sensation symmetric 7th - facial strength symmetric 8th - hearing intact 9th - palate elevates symmetrically, uvula midline 11th - shoulder shrug symmetric 12th - tongue protrusion midline  MOTOR:  normal bulk and tone, full strength in the BUE, BLE  SENSORY:  normal and symmetric to light touch, temperature,  vibration  COORDINATION:  finger-nose-finger, fine finger movements normal  REFLEXES:  deep tendon reflexes 1+ and symmetric  GAIT/STATION:  narrow based gait     DIAGNOSTIC DATA (LABS, IMAGING, TESTING) - I reviewed patient records, labs, notes, testing and imaging myself where available.  Lab Results  Component Value Date   WBC 3.3 (L) 03/14/2024   HGB 11.4 03/14/2024   HCT 37.4 03/14/2024   MCV 89 03/14/2024   PLT 195 03/14/2024      Component Value Date/Time   NA 143 02/09/2024 2134   K 2.8 (L) 02/09/2024 2134   CL 108 02/09/2024 2134   CO2 23 02/09/2024 2122   GLUCOSE 86 02/09/2024 2134   BUN 4 (L) 02/09/2024 2134   CREATININE 0.50 02/09/2024 2134   CALCIUM 8.8 (L) 02/09/2024 2122   PROT 7.3 09/17/2023 1719   ALBUMIN 3.6 09/17/2023 1719   ALBUMIN 3.7 (L) 09/09/2023 0529   AST 13 (L) 09/17/2023 1719   ALT 19 09/17/2023 1719   ALKPHOS 50 09/17/2023 1719   BILITOT 0.8 09/17/2023 1719   GFRNONAA >60 02/09/2024 2122   GFRAA >90 12/29/2013 2208   No results found for: CHOL, HDL, LDLCALC, LDLDIRECT, TRIG, CHOLHDL No results found for: YHAJ8R Lab Results  Component Value Date   VITAMINB12 470 09/14/2023   No results found for: TSH   CSF studies: RBC: 0 WBC: 2 Differential: Too few to count Glucose 40 Protein 52 CSF VDRL negative CSF VZV negative    IgG CSF index -IgG, CSF 11.3 -Albumin, CSF 23 -IgG, serum 2050 -Albumin 3.7 -IgG/Albumin ratio, CSF 0.49 -CSF IgG index 0.9   Serum: RPR negative T palladium negative Meningitis/encephalitis panel negative ESR elevated 85 SSB antibody elevated >8.0 Vitamin D  low 10.32 Anti-MOG negative CMV, RPR - negative HIV ab negative HTLV 1+2 ab -negative Serum ACE negative Serum RF negative  09/10/23    Component Ref Range & Units (hover) 6 mo ago  NMO-IgG 2,740.3 High       09/09/23 MRI cervical / thoracic / lumbar spine [I reviewed images myself and agree with interpretation.  -VRP] - Longitudinally extensive spinal cord lesion spanning T1-T9, history of lupus suggesting autoimmune cause.  09/11/23 MRI brain - Normal brain MRI. No acute intracranial abnormality or findings to explain patient's symptoms.  10/09/23 MRI thoracic spine [I reviewed images myself and agree with interpretation. Improved from Dec 2024. -VRP]  - Nonenhancing right eccentric T2 hyperintense cord signal  spanning from T4-T5 to T6-T7, improved since 09/09/2023.    ASSESSMENT AND PLAN  33 y.o. year old female here with:  Dx:  1. Neuromyelitis optica (devic) (HCC)   2. Other systemic lupus erythematosus with other organ involvement (HCC)   3. Sjogren's syndrome with keratoconjunctivitis sicca (HCC)     PLAN:  Neuromyelitis optica --> LONGITUDINALLY EXTENSIVE TRANSVERSE MYELITIS (T1-T9) --> follow-up of hospital lab testing done in December 2024 shows positive anti-NMO antibody; also history of lupus and sjogren's syndrome - plan to start rituximab IV every 6 months (starting with two 1 g infusions separated by a two-week interval, followed by a 1 g infusion every six months) - currently on hydroxychloroquine  for lupus / sjogren's  No orders of the defined types were placed in this encounter.  Return in about 4 months (around 08/15/2024).  Virtual Visit via Video Note  I connected with Stacy Kelley on 04/14/2024 at  3:00 PM EDT by a video enabled telemedicine application and verified that I am speaking with the correct person using two identifiers.   I discussed the limitations of evaluation and management by telemedicine and the availability of in person appointments. The patient expressed understanding and agreed to proceed.  Patient is at home and I am at the office.   I spent 30 minutes of face-to-face and non-face-to-face time with patient.  This included previsit chart review, lab review, study review, order entry, electronic health record documentation, patient education.      EDUARD FABIENE HANLON, MD 04/14/2024, 3:28 PM Certified in Neurology, Neurophysiology and Neuroimaging  [facility] [address], [address] [place], [place] [address] [phone]

## 2024-04-14 NOTE — Patient Instructions (Addendum)
-   plan to start rituximab infusion

## 2024-04-15 NOTE — Telephone Encounter (Signed)
 Did not receive a return call from [facility] but PA made a phone note shown below.

## 2024-04-27 ENCOUNTER — Ambulatory Visit
Admission: RE | Admit: 2024-04-27 | Discharge: 2024-04-27 | Disposition: A | Discharge status: Home / Self Care | Source: Ambulatory Visit | Attending: Diagnostic Neuroimaging | Admitting: Diagnostic Neuroimaging

## 2024-04-27 DIAGNOSIS — G360 Neuromyelitis optica [Devic]: Secondary | ICD-10-CM | POA: Diagnosis not present

## 2024-04-27 MED ORDER — GADOPICLENOL 0.5 MMOL/ML IV SOLN
5.0000 mL | Freq: Once | INTRAVENOUS | Status: AC | PRN
  Administered 2024-04-27: 5 mL via INTRAVENOUS

## 2024-05-10 ENCOUNTER — Telehealth: Payer: Self-pay

## 2024-05-10 NOTE — Telephone Encounter (Signed)
 Received email from Delon Schmitz, Rep with [facility] regarding Pt starting infusion therapy as Pt has not made us  aware if she intends to start the therapy. Attempted to call Pt, no answer, LVM for call back.

## 2024-05-10 NOTE — Telephone Encounter (Signed)
 Pt cld back and after asking a few questions stated she wants to move forward with Ruxience infusion therapy. Pt did request to have infusions done in her home. Informed Pt will make the company aware so that can be set up. Will also make provider aware for form to be signed and sent to [facility]. Also sent f/u email to [facility] rep, Delon Schmitz, making her aware of Pt decision. Will send order as soon as provider signs.

## 2024-05-11 NOTE — Telephone Encounter (Signed)
 Start form signed and faxed to [facility] as requested to: [phone].

## 2024-05-16 ENCOUNTER — Telehealth: Payer: Self-pay

## 2024-05-16 NOTE — Telephone Encounter (Signed)
 Adam from [facility] called in regards to Pt infusion. Pt has allergy to methylprednisolone  and prednisone  and SoluMedrol 100 mg IV is used as a Technical brewer. Adam wants to err on the side of caution and use Benadryl  50 mg IV instead of PO, along with the famotidine IV 20 mg but would like provider input before infusion is given. Informed Adam provider is out of the office until Thursday but will send to the work in provider to review and will reach out to him with provider response. Adam voiced understanding.

## 2024-05-16 NOTE — Telephone Encounter (Signed)
 Spoke w/Adam at [employer] to make him aware the work in provider for Dr. Margaret reviewed the chart and [facility] recommendations and agrees to the IV famotidine and IV Benadryl  since Pt has allergy to Solumedrol. Adam voiced understanding and thanks for the call back.

## 2024-05-16 NOTE — Telephone Encounter (Signed)
 Chart reviewed, seen by Dr. Penumallin in July,   Neuromyelitis optica --> LONGITUDINALLY EXTENSIVE TRANSVERSE MYELITIS (T1-T9) --> follow-up of hospital lab testing done in December 2024 shows positive anti-NMO antibody; also history of lupus and sjogren's syndrome - plan to start rituximab IV every 6 months (starting with two 1 g infusions separated by a two-week interval, followed by a 1 g infusion every six months) - currently on hydroxychloroquine  for lupus / sjogren's      Solumedrol is equivalent to methylprednisolone ,  It is Ok to use IV famotidine and iv benadryl 

## 2024-05-20 ENCOUNTER — Encounter: Payer: Self-pay | Admitting: Physical Medicine and Rehabilitation

## 2024-05-20 ENCOUNTER — Encounter: Attending: Physical Medicine and Rehabilitation | Admitting: Physical Medicine and Rehabilitation

## 2024-05-20 VITALS — BP 122/76 | HR 79 | Ht 62.0 in | Wt 104.0 lb

## 2024-05-20 DIAGNOSIS — G360 Neuromyelitis optica [Devic]: Secondary | ICD-10-CM | POA: Diagnosis not present

## 2024-05-20 DIAGNOSIS — G373 Acute transverse myelitis in demyelinating disease of central nervous system: Secondary | ICD-10-CM | POA: Diagnosis present

## 2024-05-20 DIAGNOSIS — R269 Unspecified abnormalities of gait and mobility: Secondary | ICD-10-CM | POA: Insufficient documentation

## 2024-05-20 DIAGNOSIS — M21372 Foot drop, left foot: Secondary | ICD-10-CM | POA: Diagnosis present

## 2024-05-20 DIAGNOSIS — M792 Neuralgia and neuritis, unspecified: Secondary | ICD-10-CM | POA: Insufficient documentation

## 2024-05-20 DIAGNOSIS — M21371 Foot drop, right foot: Secondary | ICD-10-CM | POA: Insufficient documentation

## 2024-05-20 NOTE — Patient Instructions (Addendum)
 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   how it's so important to take meds recommended Rituximab every 6 months- is probably the best choice- least side effects- but need to do any labs beforehand that Neuro suggests.    2.  Needs FMLA forms from school- and send to Neurology- - the ones who would do infusion- if they won't do, reach out to me- 7-14 days to get the paperwork done. Intermittent leave- is what we are writing for. Protects you from being told you are missing too much work.   3.  Not on meds from me- however if you have difficulty  getting Tramadol - then let me know, and I prescribe.    4. At level SCI pain- abd pain is most common-  the only treatment- is nerve pain- we discussed Lyrica or Duloxetine- she wants to wait at this time.   5.  Don't think you need braces- but just be aware that  if you tired faster, that shoes with a hard sole- work better- but for you lichenoid dermatitis on hands/feet, would do better with soft soled shoes. Not an easy answer- but fi they will allow you at work to have a stool that you can use when needed- - if they won't, I will write Rx for stool.   6. F/U in 3 months- double appt- NMO  7. Went over MRIs- she looks better  in person than the MRI does, but it's better than what it was in December- about the same fro January- 2025- and most changes are at T5/6- which is like it was originally- and Rituximab will help that.    8. No major spasticity- which is great.

## 2024-05-20 NOTE — Progress Notes (Signed)
 Subjective:    Patient ID: Stacy Kelley, female    DOB: June 08, 1991, 33 y.o.   MRN: [id number]  HPI HPI Pt is a 33 yr old female with hx of Lupus (SLE)/Sjogren's admitted 08/2023 for  Transverse myelitis from T1 to T9-  She also had recurrent R axilla Hidrenadenitis,  Regularly refuses meds if she thinks she doesn't need them Here for f/u on transverse myelitis/NMO- neuromyelitis optica    Hasn't started Rituximab- doesn't want to, but trying to do the right thing to do it.   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 Can work around her kids.   Lichenoid dermatitis dx'd- got a cream that's working.   Walking is getting better Still numb in torso and sore and legs-  Getting somewhat better.  NO Assistive device.   In new school-   Takes tramadol  when really needs- usually takes Tylenol  and occ ibuprofen - but not much.   Couple times per week for Tramadol .   C/O at level SCI- seatbelt around her that's too tight  Cannot stand for long- but if sits for long, will get stiff and more numb.    Pain Inventory Average Pain 6 Pain Right Now 3 My pain is sharp, dull, tingling, and aching  In the last 24 hours, has pain interfered with the following? General activity 5 Relation with others 0 Enjoyment of life 6 What TIME of day is your pain at its worst? varies Sleep (in general) Fair  Pain is worse with: walking, bending, sitting, and standing Pain improves with: medication Relief from Meds: 5  Family History  Problem Relation Age of Onset   Diabetes Maternal Grandmother    Diabetes Maternal Grandfather    Diabetes Paternal Grandmother    Hypertension Paternal Grandfather    Diabetes Paternal Grandfather    Social History   Socioeconomic History   Marital status: Single    Spouse name: Not on file   Number of children: 1   Years of education: Not on file   Highest education level: Not on file  Occupational History     Employer: UNEMPLOYED  Tobacco Use   Smoking status: Never   Smokeless tobacco: Never  Vaping Use   Vaping status: Never Used  Substance and Sexual Activity   Alcohol use: No    Alcohol/week: 0.0 standard drinks of alcohol   Drug use: Yes    Types: Marijuana, Methaqualone    Comment: smokes daily   Sexual activity: Yes    Partners: Male    Birth control/protection: Injection  Other Topics Concern   Not on file  Social History Narrative   Currently unemployed due to health   Social Drivers of Health   Financial Resource Strain: Not on file  Food Insecurity: No Food Insecurity (09/22/2023)   Hunger Vital Sign    Worried About Running Out of Food in the Last Year: Never true    Ran Out of Food in the Last Year: Never true  Transportation Needs: Unmet Transportation Needs (09/22/2023)   PRAPARE - Administrator, Civil Service (Medical): Yes    Lack of Transportation (Non-Medical): No  Physical Activity: Not on file  Stress: Not on file  Social Connections: Unknown (01/30/2022)   Received from [facility]   Social Network    Social Network: Not on file   Past Surgical History:  Procedure Laterality Date   CESAREAN SECTION     Past Surgical  History:  Procedure Laterality Date   CESAREAN SECTION     Past Medical History:  Diagnosis Date   Genital herpes    Gonorrhea    Lupus (systemic lupus erythematosus) (HCC) 04/2022   Sjogren's syndrome (HCC) 05/12/2022   SLE (systemic lupus erythematosus) (HCC)    Thyromegaly 09/03/2022   Vitamin D  deficiency 06/11/2022   BP 122/76   Pulse 79   Ht 5' 2 (1.575 m)   Wt 104 lb (47.2 kg)   SpO2 100%   BMI 19.02 kg/m   Opioid Risk Score:   Fall Risk Score:  `1  Depression screen PHQ 2/9      No data to display           Review of Systems  All other systems reviewed and are negative.      Objective:   Physical Exam  Awake, alert, appropriate, tearful at times, no assistive device, NAD MSK:  RLE_ HF 4+/5; KE 5-/5; KF 5-/5; DF 4/5 and PF 4/5 LLE_ HF 5-/5; KE/KF 5-/5; DF 4-/5 and PF 4-/5  Neuro: No clonus B/L No increased tone, however 3+ DTRs B/L in patella and Achilles   Skin- skin peeling to deeper level so on hands and feet      Assessment & Plan:   Pt is a 33 yr old female with hx of Lupus (SLE)/Sjogren's admitted 08/2023 for  Transverse myelitis from T1 to T9-  She also had recurrent R axilla Hidrenadenitis,  Regularly refuses meds if she thinks she doesn't need them Here for f/u on transverse myelitis (due to NMO) /NMO-neuromyelitis optica  - 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   how it's so important to take meds recommended Rituximab every 6 months- is probably the best choice- least side effects- but need to do any labs beforehand that Neuro suggests.    2.  Needs FMLA forms from school- and send to Neurology- - the ones who would do infusion- if they won't do, reach out to me- 7-14 days to get the paperwork done. Intermittent leave- is what we are writing for. Protects you from being told you are missing too much work.   3.  Not on meds from me- however if you have difficulty  getting Tramadol - then let me know, and I prescribe.    4. At level SCI pain- abd pain is most common-  the only treatment- is nerve pain- we discussed Lyrica or Duloxetine- she wants to wait at this time.   5.  Don't think you need braces- but just be aware that  if you tired faster, that shoes with a hard sole- work better- but for you lichenoid dermatitis on hands/feet, would do better with soft soled shoes. Not an easy answer- but fi they will allow you at work to have a stool that you can use when needed- - if they won't, I will write Rx for stool.   6. F/U in 3 months- double appt- NMO  7. Went over MRIs- she looks better  in  person than the MRI does, but it's better than what it was in December- about the same fro January- 2025- and most changes are at T5/6- which is like it was originally- and Rituximab will help that.   8. No major spasticity on exam- which is great.  I spent a total of 51   minutes on total care today- >50% coordination of care- due to d/w pt about NMO what it is, what it means and meds- also went over MRI, nerve pain, at level SCI pain; and bracing to not do right now

## 2024-05-31 NOTE — Telephone Encounter (Signed)
 Received a notification from [facility] indicating that the patient has a start date of 06/24/2024 through the home health agency [facility]

## 2024-06-01 ENCOUNTER — Telehealth: Admitting: Diagnostic Neuroimaging

## 2024-06-08 ENCOUNTER — Ambulatory Visit: Admitting: Physical Medicine and Rehabilitation

## 2024-06-24 ENCOUNTER — Encounter: Payer: Self-pay | Admitting: Diagnostic Neuroimaging

## 2024-07-28 ENCOUNTER — Other Ambulatory Visit: Payer: Self-pay

## 2024-07-28 ENCOUNTER — Emergency Department (HOSPITAL_BASED_OUTPATIENT_CLINIC_OR_DEPARTMENT_OTHER)
Admission: EM | Admit: 2024-07-28 | Discharge: 2024-07-28 | Discharge status: Against Medical Advice | Attending: Emergency Medicine | Admitting: Emergency Medicine

## 2024-07-28 ENCOUNTER — Emergency Department (HOSPITAL_BASED_OUTPATIENT_CLINIC_OR_DEPARTMENT_OTHER): Admitting: Radiology

## 2024-07-28 DIAGNOSIS — M25511 Pain in right shoulder: Secondary | ICD-10-CM | POA: Diagnosis not present

## 2024-07-28 DIAGNOSIS — M542 Cervicalgia: Secondary | ICD-10-CM | POA: Insufficient documentation

## 2024-07-28 DIAGNOSIS — Z5321 Procedure and treatment not carried out due to patient leaving prior to being seen by health care provider: Secondary | ICD-10-CM | POA: Diagnosis not present

## 2024-07-28 NOTE — ED Triage Notes (Signed)
 Patient reports 2 days of right neck and right shoulder pain. She says it is 8/10 pain. She reports maybe she slept wrong. She has not ad an injury to it. Denies fevers and chills.

## 2024-08-29 ENCOUNTER — Encounter: Attending: Physical Medicine and Rehabilitation | Admitting: Physical Medicine and Rehabilitation

## 2024-08-29 DIAGNOSIS — G360 Neuromyelitis optica [Devic]: Secondary | ICD-10-CM | POA: Insufficient documentation

## 2024-08-29 DIAGNOSIS — M21372 Foot drop, left foot: Secondary | ICD-10-CM | POA: Insufficient documentation

## 2024-08-29 DIAGNOSIS — G373 Acute transverse myelitis in demyelinating disease of central nervous system: Secondary | ICD-10-CM | POA: Insufficient documentation

## 2024-08-29 DIAGNOSIS — M21371 Foot drop, right foot: Secondary | ICD-10-CM | POA: Insufficient documentation

## 2024-08-29 DIAGNOSIS — R269 Unspecified abnormalities of gait and mobility: Secondary | ICD-10-CM | POA: Insufficient documentation

## 2024-08-29 DIAGNOSIS — M792 Neuralgia and neuritis, unspecified: Secondary | ICD-10-CM | POA: Insufficient documentation

## 2024-08-29 NOTE — Progress Notes (Deleted)
   Subjective:    Patient ID: Stacy Kelley, female    DOB: 1991/01/08, 33 y.o.   MRN: [id number]  HPI   Pain Inventory Average Pain {NUMBERS; 0-10:5044} Pain Right Now {NUMBERS; 0-10:5044} My pain is {PAIN DESCRIPTION:21022940}  In the last 24 hours, has pain interfered with the following? General activity {NUMBERS; 0-10:5044} Relation with others {NUMBERS; 0-10:5044} Enjoyment of life {NUMBERS; 0-10:5044} What TIME of day is your pain at its worst? {time of day:24191} Sleep (in general) {BHH GOOD/FAIR/POOR:22877}  Pain is worse with: {ACTIVITIES:21022942} Pain improves with: {PAIN IMPROVES TPUY:78977056} Relief from Meds: {NUMBERS; 0-10:5044}  Family History  Problem Relation Age of Onset   Diabetes Maternal Grandmother    Diabetes Maternal Grandfather    Diabetes Paternal Grandmother    Hypertension Paternal Grandfather    Diabetes Paternal Grandfather    Social History   Socioeconomic History   Marital status: Single    Spouse name: Not on file   Number of children: 1   Years of education: Not on file   Highest education level: Not on file  Occupational History    Employer: UNEMPLOYED  Tobacco Use   Smoking status: Never   Smokeless tobacco: Never  Vaping Use   Vaping status: Never Used  Substance and Sexual Activity   Alcohol use: No    Alcohol/week: 0.0 standard drinks of alcohol   Drug use: Yes    Types: Marijuana, Methaqualone    Comment: smokes daily   Sexual activity: Yes    Partners: Male    Birth control/protection: Injection  Other Topics Concern   Not on file  Social History Narrative   Currently unemployed due to health   Social Drivers of Health   Financial Resource Strain: Not on file  Food Insecurity: No Food Insecurity (09/22/2023)   Hunger Vital Sign    Worried About Running Out of Food in the Last Year: Never true    Ran Out of Food in the Last Year: Never true  Transportation Needs: Unmet Transportation Needs (09/22/2023)    PRAPARE - Administrator, Civil Service (Medical): Yes    Lack of Transportation (Non-Medical): No  Physical Activity: Not on file  Stress: Not on file  Social Connections: Not on file   Past Surgical History:  Procedure Laterality Date   CESAREAN SECTION     Past Surgical History:  Procedure Laterality Date   CESAREAN SECTION     Past Medical History:  Diagnosis Date   Genital herpes    Gonorrhea    Lupus (systemic lupus erythematosus) (HCC) 04/2022   Sjogren's syndrome 05/12/2022   SLE (systemic lupus erythematosus) (HCC)    Thyromegaly 09/03/2022   Vitamin D  deficiency 06/11/2022   There were no vitals taken for this visit.  Opioid Risk Score:   Fall Risk Score:  `1  Depression screen PHQ 2/9      No data to display          Review of Systems     Objective:   Physical Exam        Assessment & Plan:
